# Patient Record
Sex: Male | Born: 1975
Health system: Southern US, Community
[De-identification: ages and names within clinical notes are randomized; demographics above are authoritative.]

## PROBLEM LIST (undated history)

## (undated) DIAGNOSIS — I214 Non-ST elevation (NSTEMI) myocardial infarction: Secondary | ICD-10-CM

## (undated) DIAGNOSIS — E785 Hyperlipidemia, unspecified: Secondary | ICD-10-CM

## (undated) HISTORY — DX: Hyperlipidemia, unspecified: E78.5

## (undated) NOTE — *Deleted (*Deleted)
Physician Discharge Summary  Patient ID: Aycen Porreca MRN: [id number] DOB/AGE: [birth year] 11 y.o.  Admit date: 06/15/2020 Discharge date: 06/16/2020  Admission Diagnoses:  Discharge Diagnoses:  Active Problems:   S/P CABG x 3  Patient Active Problem List   Diagnosis Date Noted  . NSTEMI (non-ST elevated myocardial infarction) (HCC) 06/15/2020  . S/P CABG x 3 06/15/2020  . Chest pain 06/14/2020  . Non-ST elevation (NSTEMI) myocardial infarction [facility])    History of Present Illness:  at time of consult    The patient is a 36 year old male with no previous documented history of cardiac disease.  He does have a known family history with father having CABG at age 75.  He was recently diagnosed approximately 6 months ago with hyperlipidemia.  He was prescribed atorvastatin but this was discontinued due to severe myalgia symptoms.  The patient is quite physically active and plays soccer frequently.  During a match on Thursday of last week he developed an episode of prolonged substernal chest tightness that lasted for approximately 1 hour and then subsided.  He continued to have some symptoms with exertion and attempting to play another game the pain worsened he was unable to continue.  On 06/13/2020 he had an episode at work while going up the stairs prompting further evaluation.  An EKG was done at rest and was unremarkable.  A repeat EKG with some activity showed a 2 mm ST depression in the inferior leads with a 1 to 2 mm ST elevation in lead aVR.  The patient was admitted for further evaluation and treatment to include cardiology consultation. Troponins (HS)peaked at 333 . LVEF was estimated at 50 to 55% and the full report is listed below.  Cardiac catheterization shows severe three-vessel coronary artery disease with occluded ostial LAD with some collaterals from the RCA, 95% stenosis in a large ostial ramus and severe right PDA disease.  EF was 50% with apical hypokinesis and mildly elevated  LVEDP at 24 mmHg.  Due to these findings an intra-aortic balloon pump has been placed this morning and he was transferred from [facility] for surgical coronary artery revascularization by Dr. Cliffton Asters   Discharged Condition: {condition:18240}  Hospital Course: ***  Consults: {consultation:18241}  Significant Diagnostic Studies: {diagnostics:18242}  Treatments: {Tx:18249}  Discharge Exam: Blood pressure 121/68, pulse 79, temperature 97.8 F (36.6 C), temperature source Oral, resp. rate (!) 23, height 5\' 9"  (1.753 m), weight 80.8 kg, SpO2 99 %. {physical YHCW:2376283}  Disposition:  There are no questions and answers to display.        Discharge Instructions    AMB Referral to Cardiac Rehabilitation - Phase II   Complete by: As directed    Diagnosis: CABG   CABG X ___: 3   After initial evaluation and assessments completed: Virtual Based Care may be provided alone or in conjunction with Phase 2 Cardiac Rehab based on patient barriers.: Yes     Allergies as of 06/16/2020   No Known Allergies   Med Rec must be completed prior to using this [facility]***       Follow-up Information    Iran Ouch, MD Follow up.   Specialty: Cardiology Why: Please see discharge paperwork for follow-up of her right with cardiology. Contact information: [address] [place] [address] [phone]        Corliss Skains, MD Follow up.   Specialty: Cardiothoracic Surgery Why: Please see discharge paperwork for follow-up appointment with surgeon.  Also obtain a chest  x-ray at [facility] 1/2-hour prior to this appointment.  It is located in the same office complex on the first floor. Contact information: [address] [place] [address] [phone]             The patient has been discharged on:   1.Beta Blocker:  Yes [   ]                              No   [   ]                              If No, reason:   2.Ace Inhibitor/ARB: Yes [   ]                                     No  [    ]                                     If No, reason:  3.Statin:   Yes [   ]                  No  [   ]                  If No, reason:  4.Ecasa:  Yes  [   ]                  No   [   ]                  If No, reason:   Signed: Glenice Laine Gold 06/16/2020, 3:10 PM

---

## 2018-10-09 DIAGNOSIS — F8189 Other developmental disorders of scholastic skills: Secondary | ICD-10-CM | POA: Diagnosis not present

## 2019-04-04 DIAGNOSIS — Z20828 Contact with and (suspected) exposure to other viral communicable diseases: Secondary | ICD-10-CM | POA: Diagnosis not present

## 2019-05-23 MED FILL — predniSONE 20 MG TABS: 20 | 10 days supply | Qty: 20 | Fill #0

## 2019-05-23 MED FILL — hydrOXYzine HCL 25 MG TABS: 25 | 7 days supply | Qty: 20 | Fill #0

## 2019-06-18 DIAGNOSIS — Z0189 Encounter for other specified special examinations: Secondary | ICD-10-CM | POA: Diagnosis not present

## 2019-06-18 DIAGNOSIS — R42 Dizziness and giddiness: Secondary | ICD-10-CM | POA: Diagnosis not present

## 2019-06-18 DIAGNOSIS — Z20828 Contact with and (suspected) exposure to other viral communicable diseases: Secondary | ICD-10-CM | POA: Diagnosis not present

## 2019-11-06 DIAGNOSIS — M25462 Effusion, left knee: Secondary | ICD-10-CM | POA: Diagnosis not present

## 2019-11-06 DIAGNOSIS — M25562 Pain in left knee: Secondary | ICD-10-CM | POA: Diagnosis not present

## 2019-11-11 DIAGNOSIS — L910 Hypertrophic scar: Secondary | ICD-10-CM | POA: Diagnosis not present

## 2019-11-18 DIAGNOSIS — M25462 Effusion, left knee: Secondary | ICD-10-CM | POA: Diagnosis not present

## 2019-12-03 DIAGNOSIS — R600 Localized edema: Secondary | ICD-10-CM | POA: Diagnosis not present

## 2019-12-03 DIAGNOSIS — M65862 Other synovitis and tenosynovitis, left lower leg: Secondary | ICD-10-CM | POA: Diagnosis not present

## 2019-12-03 DIAGNOSIS — M238X2 Other internal derangements of left knee: Secondary | ICD-10-CM | POA: Diagnosis not present

## 2019-12-03 DIAGNOSIS — M67962 Unspecified disorder of synovium and tendon, left lower leg: Secondary | ICD-10-CM | POA: Diagnosis not present

## 2019-12-03 DIAGNOSIS — M67814 Other specified disorders of tendon, left shoulder: Secondary | ICD-10-CM | POA: Diagnosis not present

## 2019-12-03 DIAGNOSIS — M25462 Effusion, left knee: Secondary | ICD-10-CM | POA: Diagnosis not present

## 2020-05-20 DIAGNOSIS — J00 Acute nasopharyngitis [common cold]: Secondary | ICD-10-CM | POA: Diagnosis not present

## 2020-05-20 DIAGNOSIS — Z03818 Encounter for observation for suspected exposure to other biological agents ruled out: Secondary | ICD-10-CM | POA: Diagnosis not present

## 2020-05-20 DIAGNOSIS — Z1152 Encounter for screening for COVID-19: Secondary | ICD-10-CM | POA: Diagnosis not present

## 2020-06-13 ENCOUNTER — Other Ambulatory Visit
Admission: RE | Admit: 2020-06-13 | Discharge: 2020-06-13 | Disposition: A | Discharge status: Home / Self Care | Payer: 59 | Source: Home / Self Care | Attending: Internal Medicine | Admitting: Internal Medicine

## 2020-06-13 DIAGNOSIS — Z01810 Encounter for preprocedural cardiovascular examination: Secondary | ICD-10-CM | POA: Diagnosis not present

## 2020-06-13 DIAGNOSIS — I952 Hypotension due to drugs: Secondary | ICD-10-CM | POA: Diagnosis not present

## 2020-06-13 DIAGNOSIS — Z83438 Family history of other disorder of lipoprotein metabolism and other lipidemia: Secondary | ICD-10-CM | POA: Diagnosis not present

## 2020-06-13 DIAGNOSIS — I200 Unstable angina: Secondary | ICD-10-CM | POA: Diagnosis not present

## 2020-06-13 DIAGNOSIS — Z825 Family history of asthma and other chronic lower respiratory diseases: Secondary | ICD-10-CM | POA: Diagnosis not present

## 2020-06-13 DIAGNOSIS — I25110 Atherosclerotic heart disease of native coronary artery with unstable angina pectoris: Secondary | ICD-10-CM | POA: Diagnosis not present

## 2020-06-13 DIAGNOSIS — R079 Chest pain, unspecified: Secondary | ICD-10-CM | POA: Insufficient documentation

## 2020-06-13 DIAGNOSIS — I739 Peripheral vascular disease, unspecified: Secondary | ICD-10-CM | POA: Diagnosis not present

## 2020-06-13 DIAGNOSIS — I214 Non-ST elevation (NSTEMI) myocardial infarction: Secondary | ICD-10-CM | POA: Diagnosis not present

## 2020-06-13 DIAGNOSIS — Z20822 Contact with and (suspected) exposure to covid-19: Secondary | ICD-10-CM | POA: Diagnosis not present

## 2020-06-13 DIAGNOSIS — E785 Hyperlipidemia, unspecified: Secondary | ICD-10-CM | POA: Insufficient documentation

## 2020-06-13 DIAGNOSIS — T463X5A Adverse effect of coronary vasodilators, initial encounter: Secondary | ICD-10-CM | POA: Diagnosis not present

## 2020-06-13 DIAGNOSIS — I340 Nonrheumatic mitral (valve) insufficiency: Secondary | ICD-10-CM | POA: Diagnosis not present

## 2020-06-13 DIAGNOSIS — R0789 Other chest pain: Secondary | ICD-10-CM | POA: Diagnosis not present

## 2020-06-13 DIAGNOSIS — R0602 Shortness of breath: Secondary | ICD-10-CM | POA: Diagnosis not present

## 2020-06-13 DIAGNOSIS — R030 Elevated blood-pressure reading, without diagnosis of hypertension: Secondary | ICD-10-CM | POA: Diagnosis not present

## 2020-06-13 DIAGNOSIS — Z8249 Family history of ischemic heart disease and other diseases of the circulatory system: Secondary | ICD-10-CM | POA: Diagnosis not present

## 2020-06-13 LAB — CBC WITH DIFFERENTIAL/PLATELET
Abs Immature Granulocytes: 0.01 10*3/uL (ref 0.00–0.07)
Basophils Absolute: 0.1 10*3/uL (ref 0.0–0.1)
Basophils Relative: 1 %
Eosinophils Absolute: 0.1 10*3/uL (ref 0.0–0.5)
Eosinophils Relative: 3 %
HCT: 43.2 % (ref 39.0–52.0)
Hemoglobin: 14.6 g/dL (ref 13.0–17.0)
Immature Granulocytes: 0 %
Lymphocytes Relative: 42 %
Lymphs Abs: 2.4 10*3/uL (ref 0.7–4.0)
MCH: 29.1 pg (ref 26.0–34.0)
MCHC: 33.8 g/dL (ref 30.0–36.0)
MCV: 86.2 fL (ref 80.0–100.0)
Monocytes Absolute: 0.4 10*3/uL (ref 0.1–1.0)
Monocytes Relative: 8 %
Neutro Abs: 2.7 10*3/uL (ref 1.7–7.7)
Neutrophils Relative %: 46 %
Platelets: 257 10*3/uL (ref 150–400)
RBC: 5.01 MIL/uL (ref 4.22–5.81)
RDW: 12.8 % (ref 11.5–15.5)
WBC: 5.7 10*3/uL (ref 4.0–10.5)
nRBC: 0 % (ref 0.0–0.2)

## 2020-06-13 LAB — COMPREHENSIVE METABOLIC PANEL
ALT: 20 U/L (ref 0–44)
AST: 22 U/L (ref 15–41)
Albumin: 4.6 g/dL (ref 3.5–5.0)
Alkaline Phosphatase: 63 U/L (ref 38–126)
Anion gap: 12 (ref 5–15)
BUN: 14 mg/dL (ref 6–20)
CO2: 26 mmol/L (ref 22–32)
Calcium: 9.3 mg/dL (ref 8.9–10.3)
Chloride: 101 mmol/L (ref 98–111)
Creatinine, Ser: 1.04 mg/dL (ref 0.61–1.24)
GFR, Estimated: >60 mL/min (ref >60)
Glucose, Bld: 110 mg/dL — ABNORMAL HIGH (ref 70–99)
Potassium: 3.9 mmol/L (ref 3.5–5.1)
Sodium: 139 mmol/L (ref 135–145)
Total Bilirubin: 0.9 mg/dL (ref 0.3–1.2)
Total Protein: 7.6 g/dL (ref 6.5–8.1)

## 2020-06-13 LAB — LIPID PANEL
Cholesterol: 225 mg/dL — ABNORMAL HIGH (ref 0–200)
HDL: 45 mg/dL (ref >40)
LDL Cholesterol: 161 mg/dL — ABNORMAL HIGH (ref 0–99)
Total CHOL/HDL Ratio: 5 RATIO
Triglycerides: 96 mg/dL (ref <150)
VLDL: 19 mg/dL (ref 0–40)

## 2020-06-13 LAB — TSH: TSH: 1.663 u[IU]/mL (ref 0.350–4.500)

## 2020-06-14 ENCOUNTER — Observation Stay (HOSPITAL_COMMUNITY)
Admission: AD | Admit: 2020-06-14 | Discharge: 2020-06-14 | Disposition: A | Discharge status: Home / Self Care | Payer: 59 | Source: Ambulatory Visit | Attending: Internal Medicine | Admitting: Internal Medicine

## 2020-06-14 ENCOUNTER — Encounter
Admission: AD | Disposition: A | Discharge status: Other Acute Inpatient Hospital | Payer: Self-pay | Source: Ambulatory Visit | Attending: Internal Medicine

## 2020-06-14 ENCOUNTER — Other Ambulatory Visit: Payer: Self-pay

## 2020-06-14 ENCOUNTER — Ambulatory Visit
Admission: RE | Admit: 2020-06-14 | Discharge: 2020-06-14 | Disposition: A | Discharge status: Home / Self Care | Payer: 59 | Source: Home / Self Care | Attending: Internal Medicine | Admitting: Internal Medicine

## 2020-06-14 ENCOUNTER — Inpatient Hospital Stay
Admission: AD | Admit: 2020-06-14 | Discharge: 2020-06-15 | DRG: 272 | Disposition: A | Discharge status: Other Acute Inpatient Hospital | Payer: 59 | Source: Ambulatory Visit | Attending: Internal Medicine | Admitting: Internal Medicine

## 2020-06-14 ENCOUNTER — Ambulatory Visit
Admission: RE | Admit: 2020-06-14 | Discharge: 2020-06-14 | Disposition: A | Discharge status: Home / Self Care | Payer: 59 | Source: Ambulatory Visit | Attending: Internal Medicine | Admitting: Internal Medicine

## 2020-06-14 ENCOUNTER — Emergency Department
Admission: EM | Admit: 2020-06-14 | Discharge: 2020-06-14 | Disposition: A | Discharge status: Still In Hospital | Payer: 59

## 2020-06-14 ENCOUNTER — Encounter: Payer: Self-pay | Admitting: Internal Medicine

## 2020-06-14 DIAGNOSIS — R030 Elevated blood-pressure reading, without diagnosis of hypertension: Secondary | ICD-10-CM | POA: Diagnosis not present

## 2020-06-14 DIAGNOSIS — R0602 Shortness of breath: Secondary | ICD-10-CM

## 2020-06-14 DIAGNOSIS — I25110 Atherosclerotic heart disease of native coronary artery with unstable angina pectoris: Secondary | ICD-10-CM | POA: Diagnosis present

## 2020-06-14 DIAGNOSIS — R079 Chest pain, unspecified: Secondary | ICD-10-CM | POA: Diagnosis not present

## 2020-06-14 DIAGNOSIS — I200 Unstable angina: Secondary | ICD-10-CM | POA: Diagnosis not present

## 2020-06-14 DIAGNOSIS — E785 Hyperlipidemia, unspecified: Secondary | ICD-10-CM | POA: Diagnosis present

## 2020-06-14 DIAGNOSIS — T463X5A Adverse effect of coronary vasodilators, initial encounter: Secondary | ICD-10-CM | POA: Diagnosis not present

## 2020-06-14 DIAGNOSIS — Z8249 Family history of ischemic heart disease and other diseases of the circulatory system: Secondary | ICD-10-CM

## 2020-06-14 DIAGNOSIS — R0789 Other chest pain: Secondary | ICD-10-CM | POA: Diagnosis not present

## 2020-06-14 DIAGNOSIS — Z20822 Contact with and (suspected) exposure to covid-19: Secondary | ICD-10-CM | POA: Diagnosis present

## 2020-06-14 DIAGNOSIS — Z83438 Family history of other disorder of lipoprotein metabolism and other lipidemia: Secondary | ICD-10-CM

## 2020-06-14 DIAGNOSIS — I340 Nonrheumatic mitral (valve) insufficiency: Secondary | ICD-10-CM | POA: Diagnosis not present

## 2020-06-14 DIAGNOSIS — I952 Hypotension due to drugs: Secondary | ICD-10-CM | POA: Diagnosis not present

## 2020-06-14 DIAGNOSIS — Z825 Family history of asthma and other chronic lower respiratory diseases: Secondary | ICD-10-CM

## 2020-06-14 DIAGNOSIS — I214 Non-ST elevation (NSTEMI) myocardial infarction: Principal | ICD-10-CM | POA: Diagnosis present

## 2020-06-14 HISTORY — PX: LEFT HEART CATH AND CORONARY ANGIOGRAPHY: CATH118249

## 2020-06-14 HISTORY — PX: CORONARY/GRAFT ACUTE MI REVASCULARIZATION: CATH118305

## 2020-06-14 LAB — ECHOCARDIOGRAM COMPLETE
AR max vel: 2.24 cm2
AV Peak grad: 8 mmHg
Ao pk vel: 1.41 m/s
Area-P 1/2: 4.41 cm2
S' Lateral: 4.1 cm

## 2020-06-14 LAB — HIV ANTIBODY (ROUTINE TESTING W REFLEX): HIV Screen 4th Generation wRfx: NONREACTIVE

## 2020-06-14 LAB — LIPID PANEL
Cholesterol: 193 mg/dL (ref 0–200)
HDL: 43 mg/dL (ref >40)
LDL Cholesterol: 137 mg/dL — ABNORMAL HIGH (ref 0–99)
Total CHOL/HDL Ratio: 4.5 RATIO
Triglycerides: 65 mg/dL (ref <150)
VLDL: 13 mg/dL (ref 0–40)

## 2020-06-14 LAB — TROPONIN I (HIGH SENSITIVITY)
Troponin I (High Sensitivity): 308 ng/L (ref <18)
Troponin I (High Sensitivity): 333 ng/L (ref <18)

## 2020-06-14 LAB — PROTIME-INR
INR: 1 (ref 0.8–1.2)
Prothrombin Time: 12.5 seconds (ref 11.4–15.2)

## 2020-06-14 LAB — RESP PANEL BY RT PCR (RSV, FLU A&B, COVID)
Influenza A by PCR: NEGATIVE
Influenza B by PCR: NEGATIVE
Respiratory Syncytial Virus by PCR: NEGATIVE
SARS Coronavirus 2 by RT PCR: NEGATIVE

## 2020-06-14 LAB — APTT: aPTT: 29 seconds (ref 24–36)

## 2020-06-14 SURGERY — LEFT HEART CATH AND CORONARY ANGIOGRAPHY
Anesthesia: Moderate Sedation

## 2020-06-14 SURGERY — CORONARY/GRAFT ACUTE MI REVASCULARIZATION
Anesthesia: Moderate Sedation

## 2020-06-14 MED ORDER — MIDAZOLAM HCL 2 MG/2ML IJ SOLN
INTRAMUSCULAR | Status: DC | PRN
  Administered 2020-06-14: 1 mg via INTRAVENOUS

## 2020-06-14 MED ORDER — METOPROLOL TARTRATE 25 MG PO TABS: 25.0000 mg | ORAL_TABLET | Freq: Two times a day (BID) | ORAL | Status: DC

## 2020-06-14 MED ORDER — ATORVASTATIN CALCIUM 20 MG PO TABS: 40.0000 mg | ORAL_TABLET | Freq: Every day | ORAL | Status: DC

## 2020-06-14 MED ORDER — HEPARIN SODIUM (PORCINE) 1000 UNIT/ML IJ SOLN
INTRAMUSCULAR | Status: AC
  Filled 2020-06-14: qty 1

## 2020-06-14 MED ORDER — FENTANYL CITRATE (PF) 100 MCG/2ML IJ SOLN
INTRAMUSCULAR | Status: DC | PRN
Start: 2020-06-14 — End: 2020-06-14
  Administered 2020-06-14: 25 ug via INTRAVENOUS

## 2020-06-14 MED ORDER — TRAZODONE HCL 50 MG PO TABS
50.0000 mg | ORAL_TABLET | Freq: Two times a day (BID) | ORAL | Status: DC | PRN
  Filled 2020-06-14: qty 1

## 2020-06-14 MED ORDER — HEPARIN BOLUS VIA INFUSION
4000.0000 [IU] | Freq: Once | INTRAVENOUS | Status: DC
  Filled 2020-06-14: qty 4000

## 2020-06-14 MED ORDER — INFLUENZA VAC SPLIT QUAD 0.5 ML IM SUSY
0.5000 mL | PREFILLED_SYRINGE | INTRAMUSCULAR | Status: DC
  Filled 2020-06-14: qty 0.5

## 2020-06-14 MED ORDER — HEPARIN (PORCINE) 25000 UT/250ML-% IV SOLN: 900.0000 [IU]/h | INTRAVENOUS | Status: DC

## 2020-06-14 MED ORDER — VERAPAMIL HCL 2.5 MG/ML IV SOLN
INTRAVENOUS | Status: AC
  Filled 2020-06-14: qty 2

## 2020-06-14 MED ORDER — ACETAMINOPHEN 325 MG PO TABS
650.0000 mg | ORAL_TABLET | ORAL | Status: DC | PRN
  Administered 2020-06-15 (×2): 650 mg via ORAL
  Filled 2020-06-14 (×2): qty 2

## 2020-06-14 MED ORDER — SODIUM CHLORIDE 0.9 % IV SOLN: 250.0000 mL | INTRAVENOUS | Status: DC | PRN

## 2020-06-14 MED ORDER — ROSUVASTATIN CALCIUM 10 MG PO TABS
10.0000 mg | ORAL_TABLET | Freq: Every day | ORAL | Status: DC
  Administered 2020-06-14: 10 mg via ORAL
  Filled 2020-06-14: qty 1

## 2020-06-14 MED ORDER — SODIUM CHLORIDE 0.9% FLUSH: 3.0000 mL | Freq: Two times a day (BID) | INTRAVENOUS | Status: DC

## 2020-06-14 MED ORDER — METOPROLOL TARTRATE 25 MG PO TABS: ORAL_TABLET | ORAL | 0 refills | Status: DC

## 2020-06-14 MED ORDER — LIDOCAINE HCL (PF) 1 % IJ SOLN
INTRAMUSCULAR | Status: AC
  Filled 2020-06-14: qty 30

## 2020-06-14 MED ORDER — SODIUM CHLORIDE 0.9 % IV SOLN: INTRAVENOUS | Status: DC

## 2020-06-14 MED ORDER — SODIUM CHLORIDE 0.9% FLUSH
3.0000 mL | Freq: Two times a day (BID) | INTRAVENOUS | Status: DC
  Administered 2020-06-15: 3 mL via INTRAVENOUS

## 2020-06-14 MED ORDER — HEPARIN (PORCINE) IN NACL 1000-0.9 UT/500ML-% IV SOLN
INTRAVENOUS | Status: DC | PRN
  Administered 2020-06-14: 1000 mL

## 2020-06-14 MED ORDER — HEPARIN SODIUM (PORCINE) 1000 UNIT/ML IJ SOLN
INTRAMUSCULAR | Status: DC | PRN
  Administered 2020-06-14: 4000 [IU] via INTRAVENOUS

## 2020-06-14 MED ORDER — HEPARIN (PORCINE) 25000 UT/250ML-% IV SOLN
900.0000 [IU]/h | INTRAVENOUS | Status: DC
  Administered 2020-06-14: 900 [IU]/h via INTRAVENOUS
  Filled 2020-06-14: qty 250

## 2020-06-14 MED ORDER — HEPARIN BOLUS VIA INFUSION
4000.0000 [IU] | Freq: Once | INTRAVENOUS | Status: AC
  Administered 2020-06-14: 4000 [IU] via INTRAVENOUS
  Filled 2020-06-14: qty 4000

## 2020-06-14 MED ORDER — FENTANYL CITRATE (PF) 100 MCG/2ML IJ SOLN
INTRAMUSCULAR | Status: AC
  Filled 2020-06-14: qty 2

## 2020-06-14 MED ORDER — ASPIRIN 81 MG PO CHEW: 81.0000 mg | CHEWABLE_TABLET | ORAL | Status: DC

## 2020-06-14 MED ORDER — ENOXAPARIN SODIUM 40 MG/0.4ML ~~LOC~~ SOLN: 40.0000 mg | SUBCUTANEOUS | Status: DC

## 2020-06-14 MED ORDER — ONDANSETRON HCL 4 MG/2ML IJ SOLN
4.0000 mg | Freq: Four times a day (QID) | INTRAMUSCULAR | Status: DC | PRN
  Administered 2020-06-15 (×2): 4 mg via INTRAVENOUS
  Filled 2020-06-14 (×2): qty 2

## 2020-06-14 MED ORDER — SODIUM CHLORIDE 0.9% FLUSH: 3.0000 mL | INTRAVENOUS | Status: DC | PRN

## 2020-06-14 MED ORDER — SODIUM CHLORIDE 0.9% FLUSH
3.0000 mL | INTRAVENOUS | Status: DC | PRN
  Administered 2020-06-15: 3 mL via INTRAVENOUS

## 2020-06-14 MED ORDER — ROSUVASTATIN CALCIUM 10 MG PO TABS: 20.0000 mg | ORAL_TABLET | Freq: Every day | ORAL | Status: DC

## 2020-06-14 MED ORDER — HEPARIN (PORCINE) IN NACL 1000-0.9 UT/500ML-% IV SOLN
INTRAVENOUS | Status: AC
  Filled 2020-06-14: qty 1000

## 2020-06-14 MED ORDER — ASPIRIN EC 81 MG PO TBEC
81.0000 mg | DELAYED_RELEASE_TABLET | Freq: Every day | ORAL | Status: DC
  Administered 2020-06-14: 81 mg via ORAL
  Filled 2020-06-14: qty 1

## 2020-06-14 MED ORDER — MIDAZOLAM HCL 2 MG/2ML IJ SOLN
INTRAMUSCULAR | Status: AC
  Filled 2020-06-14: qty 2

## 2020-06-14 MED FILL — METOPROLOL TARTRATE 25 MG T: 25 | 1 days supply | Qty: 1 | Fill #0

## 2020-06-14 SURGICAL SUPPLY — 12 items
CATH INFINITI 5FR ANG PIGTAIL (CATHETERS) ×3 IMPLANT
CATH INFINITI 5FR JK (CATHETERS) ×3 IMPLANT
DEVICE RAD TR BAND REGULAR (VASCULAR PRODUCTS) ×3 IMPLANT
GLIDESHEATH SLEND SS 6F .021 (SHEATH) ×3 IMPLANT
GUIDEWIRE INQWIRE 1.5J.035X260 (WIRE) ×1 IMPLANT
INQWIRE 1.5J .035X260CM (WIRE) ×3
KIT ENCORE 26 ADVANTAGE (KITS) ×3 IMPLANT
KIT PV (KITS) ×3 IMPLANT
KIT RIGHT HEART (MISCELLANEOUS) ×3 IMPLANT
PACK CARDIAC CATH (CUSTOM PROCEDURE TRAY) ×3 IMPLANT
PAD ONESTEP ZOLL R SERIES ADT (MISCELLANEOUS) ×3 IMPLANT
SYR MEDRAD MARK 7 150ML (SYRINGE) ×3 IMPLANT

## 2020-06-14 NOTE — Progress Notes (Signed)
*  PRELIMINARY RESULTS* Echocardiogram 2D Echocardiogram has been performed.  Neita Garnet Mont Jagoda 06/14/2020, 8:07 PM

## 2020-06-14 NOTE — Progress Notes (Signed)
Critical troponin 308 called by lab. MD Agbata notified as requested. No new orders at this time.   Update: Patient reports 3/10 chest pain but does not want any medications at this time. VSS. Sinus bradycardia on telemetry. MD Agbata notified. No new orders.   Update: Notified by nursing staff that patient was to be transferred to cath lab at the request of MD Arida. New IV inserted. IV heparin and IVf discontinued via verbal order given by MD Kirke Corin. Patient transported to cath lab with this RN, Loraine Grip, and MD Kirke Corin. Report given to [facility], Charity fundraiser. Patient wife updated by this RN at request of patient.   Update: This RN notified that patient would be transferring to ICU 19. Belongings placed in room by this RN.

## 2020-06-14 NOTE — Progress Notes (Signed)
ANTICOAGULATION CONSULT NOTE  Pharmacy Consult for Heparin infusion Indication: chest pain/ACS  No Known Allergies  Patient Measurements: Height: 5\' 9"  (175.3 cm) Weight: 77.7 kg (171 lb 4.8 oz) IBW/kg (Calculated) : 70.7  Vital Signs: Temp: 98.4 F (36.9 C) (10/18 1704) Temp Source: Oral (10/18 1704) BP: 148/94 (10/18 1704) Pulse Rate: 67 (10/18 1704)  Labs: Recent Labs    06/13/20 1420 06/13/20 1422  HGB  --  14.6  HCT  --  43.2  PLT  --  257  CREATININE 1.04  --     Estimated Creatinine Clearance: 88.8 mL/min (by C-G formula based on SCr of 1.04 mg/dL).   Medical History: No past medical history on file.  Medications:  Per chart review, patient not on anticoagulation prior to admission  Assessment: 44yo male with no significant medical history who presents with complaints of exertional chest pain which has increased in frequency and is associated with SOB. Patient had routine blood work done about 6 months ago and was found to have hyperlipidemia. He tried to take atorvastatin 20 mg daily but did not tolerate due to severe myalgia. EKG shows sinus rhythm with nonspecific T wave inversion. Cardiac enzymes pending. Pharmacy has been consulted for heparin dosing and monitoring for ACS.  Baseline H/H and platelets WNL aPTT and PT-INR pending  Goal of Therapy:  Heparin level 0.3-0.7 units/ml Monitor platelets by anticoagulation protocol: Yes   Plan:  Give 4000 units bolus x 1 Start heparin infusion at 900 units/hr Check anti-Xa level in 6 hours and daily while on heparin Continue to monitor H&H and platelets  Cardio recommending left heart catheterization possible PCI  44yo, PharmD Pharmacy Resident  06/14/2020 6:32 PM

## 2020-06-14 NOTE — Progress Notes (Signed)
Ordered Cardiac CT for patient to have done as soon as possible for chest pain.

## 2020-06-14 NOTE — Progress Notes (Signed)
The patient was started on a heparin drip. He continues to have chest pain rated as 3 out of 10 discomfort described as tightness in spite of aspirin and heparin drip. Not able to give a beta-blocker due to bradycardia. Troponin came back elevated in the 300 range. Given his continued chest pain, recommend proceeding with urgent cardiac catheterization and possible PCI. I discussed the procedure in details as well as risk and benefits.

## 2020-06-14 NOTE — Consult Note (Signed)
Cardiology Consultation:   Patient ID: Michael Franklin MRN: [id number]; DOB: Dec 17, 1975  Admit date: 06/14/2020 Date of Consult: 06/14/2020  Primary Care Provider: Wendall Stade, MD [facility] Cardiologist: [facility] Electrophysiologist:  None    Patient Profile:   Michael Franklin is a 44 y.o. male with a hx of hyperlipidemia and family history of coronary artery disease who is being seen today for the evaluation of unstable angina at the request of Dr. Joylene Igo.  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  His father had CABG at the age of 6.  The patient had routine blood work done about 6 months ago and he was found to have hyperlipidemia.  He tried to take atorvastatin 20 mg daily but did not tolerate the medication due to severe myalgia.  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  He played a soccer match on Thursday and after he finished, he had prolonged episode of substernal chest tightness that lasted for about an hour and subsided gradually.  After that, he started having exertional chest pain and tightness.  Yesterday, he tried to play a pickup game but was limited by exertional chest pain and thus he did not continue.  He came to work today and noted chest pain while he was going upstairs.  He had an EKG done which was normal at rest.  He got his heart rate up to around 100 and repeated the EKG which showed 2 mm of ST depression in the inferior leads with 1 to 2 mm of ST elevation in aVR.  Thus, it was arranged for the patient to get directly admitted to telemetry.  He had labs already.  When I interviewed him, he reports 3 out of 10 chest pain which was happening at rest.   No past medical history on file.     Home Medications:  Prior to Admission medications   Medication Sig Start Date End Date Taking? Authorizing Provider  metoprolol tartrate (LOPRESSOR) 25 MG tablet Please take one tablet by  mouth 2 hours prior to CT 06/14/20   Wendall Stade, MD    Inpatient Medications: Scheduled Meds: . aspirin EC  81 mg Oral Daily  . [START ON 06/15/2020] rosuvastatin  20 mg Oral Daily   Continuous Infusions: . sodium chloride 50 mL/hr at 06/14/20 1712   PRN Meds: acetaminophen, ondansetron (ZOFRAN) IV, traZODone  Allergies:   No Known Allergies  Social History:   Social History   Socioeconomic History  . Marital status: Married    Spouse name: Not on file  . Number of children: Not on file  . Years of education: Not on file  . Highest education level: Not on file  Occupational History  . Not on file  Tobacco Use  . Smoking status: Never Smoker  Substance and Sexual Activity  . Alcohol use: Never  . Drug use: Never  . Sexual activity: Not on file  Other Topics Concern  . Not on file  Social History Narrative  . Not on file   Social Determinants of Health   Financial Resource Strain:   . Difficulty of Paying Living Expenses: Not on file  Food Insecurity:   . Worried About Programme researcher, broadcasting/film/video in the Last Year: Not on file  . Ran Out of Food in the Last Year: Not on file  Transportation Needs:   . Lack of Transportation (Medical): Not on file  .  Lack of Transportation (Non-Medical): Not on file  Physical Activity:   . Days of Exercise per Week: Not on file  . Minutes of Exercise per Session: Not on file  Stress:   . Feeling of Stress : Not on file  Social Connections:   . Frequency of Communication with Friends and Family: Not on file  . Frequency of Social Gatherings with Friends and Family: Not on file  . Attends Religious Services: Not on file  . Active Member of Clubs or Organizations: Not on file  . Attends Banker Meetings: Not on file  . Marital Status: Not on file  Intimate Partner Violence:   . Fear of Current or Ex-Partner: Not on file  . Emotionally Abused: Not on file  . Physically Abused: Not on file  . Sexually Abused: Not on  file    Family History:    Family History  Problem Relation Age of Onset  . Asthma Mother   . Hypertension Father   . CAD Father   . Hyperlipidemia Father      ROS:  Please see the history of present illness.   All other ROS reviewed and negative.     Physical Exam/Data:   Vitals:   06/14/20 1704  BP: (!) 148/94  Pulse: 67  Resp: 18  Temp: 98.4 F (36.9 C)  TempSrc: Oral  SpO2: 99%  Weight: 77.7 kg  Height: 5\' 9"  (1.753 m)   No intake or output data in the 24 hours ending 06/14/20 1823 Last 3 Weights 06/14/2020  Weight (lbs) 171 lb 4.8 oz  Weight (kg) 77.701 kg     Body mass index is 25.3 kg/m.  General:  Well nourished, well developed, in no acute distress HEENT: normal Lymph: no adenopathy Neck: no JVD Endocrine:  No thryomegaly Vascular: No carotid bruits; FA pulses 2+ bilaterally without bruits  Cardiac:  normal S1, S2; RRR; no murmur  Lungs:  clear to auscultation bilaterally, no wheezing, rhonchi or rales  Abd: soft, nontender, no hepatomegaly  Ext: no edema Musculoskeletal:  No deformities, BUE and BLE strength normal and equal Skin: warm and dry  Neuro:  CNs 2-12 intact, no focal abnormalities noted Psych:  Normal affect  Right radial pulses normal.  EKG:  The EKG was personally reviewed and demonstrates: Sinus bradycardia with no significant ST or T wave changes.   Relevant CV Studies:   Laboratory Data:  High Sensitivity Troponin:  No results for input(s): TROPONINIHS in the last 720 hours.   Chemistry Recent Labs  Lab 06/13/20 1420  NA 139  K 3.9  CL 101  CO2 26  GLUCOSE 110*  BUN 14  CREATININE 1.04  CALCIUM 9.3  GFRNONAA >60  ANIONGAP 12    Recent Labs  Lab 06/13/20 1420  PROT 7.6  ALBUMIN 4.6  AST 22  ALT 20  ALKPHOS 63  BILITOT 0.9   Hematology Recent Labs  Lab 06/13/20 1422  WBC 5.7  RBC 5.01  HGB 14.6  HCT 43.2  MCV 86.2  MCH 29.1  MCHC 33.8  RDW 12.8  PLT 257   BNPNo results for input(s): BNP,  PROBNP in the last 168 hours.  DDimer No results for input(s): DDIMER in the last 168 hours.   Radiology/Studies:  No results found.   Assessment and Plan:   1. Unstable angina: The patient presents with chest pain that was initially exertional but that has progressed gradually to rest pain since Thursday.  He had an EKG done  with low level of exercise that showed significant ST depression in the inferior leads with minor ST elevation in aVR.  His symptoms at rest and EKG changes are very concerning.  I agree with hospital admission, aspirin and starting unfractionated heparin.  Check serial troponin.  I recommend proceeding with left heart catheterization possible PCI.  I discussed the procedure in details as well as risk and benefits.  We will proceed tomorrow. 2. Hyperlipidemia: Intolerance to atorvastatin 20 mg daily due to severe myalgia.  He felt that he had rhabdomyolysis given severity of his symptoms.  We will try rosuvastatin 20 mg daily instead.  If he is not able to tolerate potent statins, we might need to consider treatment with a PCSK9 inhibitor depending on his coronary angiogram findings. 3. Elevated blood pressure: He reports being intermittently on amlodipine in the past.  Monitor for now and we can start treatment with an ARB or amlodipine.  No beta-blockers given baseline bradycardia.  For questions or updates, please contact [facility] Please consult [email or web address] for contact info under    Signed, Lorine Bears, MD  06/14/2020 6:23 PM

## 2020-06-14 NOTE — H&P (Signed)
History and Physical    Michael Franklin TKW:[id number] DOB: October 01, 1975 DOA: 06/14/2020  PCP: Wendall Stade, MD   Patient coming from: Home  I have personally briefly reviewed patient's old medical records in [facility]  Chief Complaint: Chest pain  HPI: Michael Franklin is a 44 y.o. male with no significant medical history who presents with complaints of exertional chest pain which has increased in frequency and is associated with shortness of breath.  He denies having any nausea, no vomiting, no diaphoresis or palpitations. He has a family history of coronary artery disease and father status post CABG at age 66. He denies having any fever, no cough, no abdominal pain, no urinary symptoms any changes in his bowel habits. He is vaccinated against the COVID-19 virus Labs show sodium 139, potassium 3.9, chloride 101, bicarb 26, BUN 14, creatinine 1.04, calcium 9.3, alkaline phosphatase 63, albumin 4.6, AST 22, ALT 20, total protein 7.6, total cholesterol 225, HDL 45, LDL 161, triglycerides Hello 96, white count 5.7, hemoglobin 14.6, hematocrit 43.2, MCV 86.2, RDW 12.8, platelet count, TSH 1.6 Twelve-lead EKG reviewed by me shows sinus rhythm with nonspecific T wave inversion in the lateral leads.   ED Course: N/A  Review of Systems: As per HPI otherwise 10 point review of systems negative.    No past medical history on file.    reports that he has never smoked. He does not have any smokeless tobacco history on file. He reports that he does not drink alcohol and does not use drugs.  No Known Allergies  Family History  Problem Relation Age of Onset  . Asthma Mother   . Hypertension Father   . CAD Father   . Hyperlipidemia Father      Prior to Admission medications   Medication Sig Start Date End Date Taking? Authorizing Provider  metoprolol tartrate (LOPRESSOR) 25 MG tablet Please take one tablet by mouth 2 hours prior to CT 06/14/20   Wendall Stade, MD    Physical  Exam: Vitals:   06/14/20 1704  BP: (!) 148/94  Pulse: 67  Resp: 18  Temp: 98.4 F (36.9 C)  TempSrc: Oral  SpO2: 99%     Vitals:   06/14/20 1704  BP: (!) 148/94  Pulse: 67  Resp: 18  Temp: 98.4 F (36.9 C)  TempSrc: Oral  SpO2: 99%    Constitutional: NAD, alert and oriented x 3 Eyes: PERRL, lids and conjunctivae normal ENMT: Mucous membranes are moist.  Neck: normal, supple, no masses, no thyromegaly Respiratory: clear to auscultation bilaterally, no wheezing, no crackles. Normal respiratory effort. No accessory muscle use.  Cardiovascular: Regular rate and rhythm, no murmurs / rubs / gallops. No extremity edema. 2+ pedal pulses. No carotid bruits.  Abdomen: no tenderness, no masses palpated. No hepatosplenomegaly. Bowel sounds positive.  Musculoskeletal: no clubbing / cyanosis. No joint deformity upper and lower extremities.  Skin: no rashes, lesions, ulcers.  Neurologic: No gross focal neurologic deficit. Psychiatric: Normal mood and affect.   Labs on Admission: I have personally reviewed following labs and imaging studies  CBC: Recent Labs  Lab 06/13/20 1422  WBC 5.7  NEUTROABS 2.7  HGB 14.6  HCT 43.2  MCV 86.2  PLT 257   Basic Metabolic Panel: Recent Labs  Lab 06/13/20 1420  NA 139  K 3.9  CL 101  CO2 26  GLUCOSE 110*  BUN 14  CREATININE 1.04  CALCIUM 9.3   GFR: CrCl cannot be calculated (Unknown ideal weight.). Liver Function  Tests: Recent Labs  Lab 06/13/20 1420  AST 22  ALT 20  ALKPHOS 63  BILITOT 0.9  PROT 7.6  ALBUMIN 4.6   No results for input(s): LIPASE, AMYLASE in the last 168 hours. No results for input(s): AMMONIA in the last 168 hours. Coagulation Profile: No results for input(s): INR, PROTIME in the last 168 hours. Cardiac Enzymes: No results for input(s): CKTOTAL, CKMB, CKMBINDEX, TROPONINI in the last 168 hours. BNP (last 3 results) No results for input(s): PROBNP in the last 8760 hours. HbA1C: No results for  input(s): HGBA1C in the last 72 hours. CBG: No results for input(s): GLUCAP in the last 168 hours. Lipid Profile: Recent Labs    06/13/20 1420  CHOL 225*  HDL 45  LDLCALC 161*  TRIG 96  CHOLHDL 5.0   Thyroid Function Tests: Recent Labs    06/13/20 1420  TSH 1.663   Anemia Panel: No results for input(s): VITAMINB12, FOLATE, FERRITIN, TIBC, IRON, RETICCTPCT in the last 72 hours. Urine analysis: No results found for: COLORURINE, APPEARANCEUR, LABSPEC, PHURINE, GLUCOSEU, HGBUR, BILIRUBINUR, KETONESUR, PROTEINUR, UROBILINOGEN, NITRITE, LEUKOCYTESUR  Radiological Exams on Admission: No results found.  EKG: Independently reviewed.  Sinus rhythm with nonspecific T wave inversions in the lateral leads  Assessment/Plan Active Problems:   Chest pain    Chest pain ??  Stable angina with increased frequency We will obtain serial cardiac enzymes to rule out an acute coronary syndrome Place patient on aspirin 81 mg daily, low-dose metoprolol and statins We will obtain 2D echocardiogram to assess LVEF and rule out regional wall motion abnormality We will consult cardiology  DVT prophylaxis: Lovenox Code Status: Full code Family Communication: Greater than 50% of time was spent discussing patient's condition and plan of care with him at the bedside.  All questions and concerns have been addressed.  He verbalizes understanding and agrees with the plan. Disposition Plan: Back to previous home environment Consults called: Cardiology    Kala Ambriz MD [facility]     06/14/2020, 5:26 PM

## 2020-06-14 NOTE — Progress Notes (Signed)
Patient admitted to unit at this time. NAD noted. No CP or SOB at this time. A/Ox4 upon initial assessment and ambulatory. Tele/PIV placed. VSS upon direct admitting. Oriented to room and bed features. No concerns noted. Will continue to monitor.

## 2020-06-14 NOTE — Addendum Note (Signed)
Addended by: Virl Axe, Teniyah Seivert L on: 06/14/2020 11:17 AM   Modules accepted: Orders

## 2020-06-15 ENCOUNTER — Inpatient Hospital Stay (HOSPITAL_COMMUNITY): Payer: 59

## 2020-06-15 ENCOUNTER — Encounter
Admission: AD | Disposition: A | Discharge status: Other Acute Inpatient Hospital | Payer: Self-pay | Source: Ambulatory Visit | Attending: Internal Medicine

## 2020-06-15 ENCOUNTER — Inpatient Hospital Stay (HOSPITAL_COMMUNITY): Payer: 59 | Admitting: Registered Nurse

## 2020-06-15 ENCOUNTER — Observation Stay: Payer: 59

## 2020-06-15 ENCOUNTER — Encounter (HOSPITAL_COMMUNITY)
Admission: EM | Disposition: A | Discharge status: Home / Self Care | Payer: Self-pay | Source: Other Acute Inpatient Hospital | Attending: Thoracic Surgery (Cardiothoracic Vascular Surgery)

## 2020-06-15 ENCOUNTER — Encounter: Payer: Self-pay | Admitting: Internal Medicine

## 2020-06-15 ENCOUNTER — Inpatient Hospital Stay (HOSPITAL_COMMUNITY)
Admission: EM | Admit: 2020-06-15 | Discharge: 2020-06-19 | DRG: 236 | Disposition: A | Discharge status: Home / Self Care | Payer: 59 | Source: Other Acute Inpatient Hospital | Attending: Thoracic Surgery (Cardiothoracic Vascular Surgery) | Admitting: Thoracic Surgery (Cardiothoracic Vascular Surgery)

## 2020-06-15 DIAGNOSIS — Z23 Encounter for immunization: Secondary | ICD-10-CM

## 2020-06-15 DIAGNOSIS — J90 Pleural effusion, not elsewhere classified: Secondary | ICD-10-CM | POA: Diagnosis not present

## 2020-06-15 DIAGNOSIS — R0602 Shortness of breath: Secondary | ICD-10-CM | POA: Diagnosis not present

## 2020-06-15 DIAGNOSIS — J9811 Atelectasis: Secondary | ICD-10-CM | POA: Diagnosis not present

## 2020-06-15 DIAGNOSIS — E8770 Fluid overload, unspecified: Secondary | ICD-10-CM | POA: Diagnosis not present

## 2020-06-15 DIAGNOSIS — Z01810 Encounter for preprocedural cardiovascular examination: Secondary | ICD-10-CM

## 2020-06-15 DIAGNOSIS — D696 Thrombocytopenia, unspecified: Secondary | ICD-10-CM | POA: Diagnosis not present

## 2020-06-15 DIAGNOSIS — Z951 Presence of aortocoronary bypass graft: Secondary | ICD-10-CM

## 2020-06-15 DIAGNOSIS — Z20822 Contact with and (suspected) exposure to covid-19: Secondary | ICD-10-CM | POA: Diagnosis present

## 2020-06-15 DIAGNOSIS — Z8249 Family history of ischemic heart disease and other diseases of the circulatory system: Secondary | ICD-10-CM | POA: Diagnosis not present

## 2020-06-15 DIAGNOSIS — I25119 Atherosclerotic heart disease of native coronary artery with unspecified angina pectoris: Secondary | ICD-10-CM | POA: Diagnosis not present

## 2020-06-15 DIAGNOSIS — Z83438 Family history of other disorder of lipoprotein metabolism and other lipidemia: Secondary | ICD-10-CM

## 2020-06-15 DIAGNOSIS — Z79891 Long term (current) use of opiate analgesic: Secondary | ICD-10-CM | POA: Diagnosis not present

## 2020-06-15 DIAGNOSIS — I25110 Atherosclerotic heart disease of native coronary artery with unstable angina pectoris: Secondary | ICD-10-CM | POA: Diagnosis not present

## 2020-06-15 DIAGNOSIS — T463X5A Adverse effect of coronary vasodilators, initial encounter: Secondary | ICD-10-CM | POA: Diagnosis not present

## 2020-06-15 DIAGNOSIS — I739 Peripheral vascular disease, unspecified: Secondary | ICD-10-CM | POA: Diagnosis not present

## 2020-06-15 DIAGNOSIS — Z79899 Other long term (current) drug therapy: Secondary | ICD-10-CM

## 2020-06-15 DIAGNOSIS — R0789 Other chest pain: Secondary | ICD-10-CM | POA: Diagnosis present

## 2020-06-15 DIAGNOSIS — D500 Iron deficiency anemia secondary to blood loss (chronic): Secondary | ICD-10-CM | POA: Diagnosis not present

## 2020-06-15 DIAGNOSIS — E785 Hyperlipidemia, unspecified: Secondary | ICD-10-CM | POA: Diagnosis present

## 2020-06-15 DIAGNOSIS — I214 Non-ST elevation (NSTEMI) myocardial infarction: Secondary | ICD-10-CM | POA: Diagnosis present

## 2020-06-15 DIAGNOSIS — I952 Hypotension due to drugs: Secondary | ICD-10-CM | POA: Diagnosis not present

## 2020-06-15 DIAGNOSIS — I200 Unstable angina: Secondary | ICD-10-CM | POA: Diagnosis not present

## 2020-06-15 DIAGNOSIS — R072 Precordial pain: Secondary | ICD-10-CM

## 2020-06-15 DIAGNOSIS — R21 Rash and other nonspecific skin eruption: Secondary | ICD-10-CM | POA: Diagnosis not present

## 2020-06-15 DIAGNOSIS — R079 Chest pain, unspecified: Secondary | ICD-10-CM | POA: Diagnosis not present

## 2020-06-15 DIAGNOSIS — Z09 Encounter for follow-up examination after completed treatment for conditions other than malignant neoplasm: Secondary | ICD-10-CM

## 2020-06-15 DIAGNOSIS — I517 Cardiomegaly: Secondary | ICD-10-CM | POA: Diagnosis not present

## 2020-06-15 DIAGNOSIS — I371 Nonrheumatic pulmonary valve insufficiency: Secondary | ICD-10-CM | POA: Diagnosis not present

## 2020-06-15 DIAGNOSIS — R918 Other nonspecific abnormal finding of lung field: Secondary | ICD-10-CM | POA: Diagnosis not present

## 2020-06-15 DIAGNOSIS — Z825 Family history of asthma and other chronic lower respiratory diseases: Secondary | ICD-10-CM | POA: Diagnosis not present

## 2020-06-15 DIAGNOSIS — R030 Elevated blood-pressure reading, without diagnosis of hypertension: Secondary | ICD-10-CM | POA: Diagnosis not present

## 2020-06-15 HISTORY — PX: CORONARY ARTERY BYPASS GRAFT: SHX141

## 2020-06-15 HISTORY — PX: TEE WITHOUT CARDIOVERSION: SHX5443

## 2020-06-15 HISTORY — PX: IABP INSERTION: CATH118242

## 2020-06-15 LAB — CBC
HCT: 40 % (ref 39.0–52.0)
HCT: 30.5 % — ABNORMAL LOW (ref 39.0–52.0)
Hemoglobin: 14 g/dL (ref 13.0–17.0)
Hemoglobin: 10.2 g/dL — ABNORMAL LOW (ref 13.0–17.0)
MCH: 29.6 pg (ref 26.0–34.0)
MCH: 29.2 pg (ref 26.0–34.0)
MCHC: 35 g/dL (ref 30.0–36.0)
MCHC: 33.4 g/dL (ref 30.0–36.0)
MCV: 84.6 fL (ref 80.0–100.0)
MCV: 87.4 fL (ref 80.0–100.0)
Platelets: 234 10*3/uL (ref 150–400)
Platelets: 172 10*3/uL (ref 150–400)
RBC: 4.73 MIL/uL (ref 4.22–5.81)
RBC: 3.49 MIL/uL — ABNORMAL LOW (ref 4.22–5.81)
RDW: 12.5 % (ref 11.5–15.5)
RDW: 12.4 % (ref 11.5–15.5)
WBC: 7.2 10*3/uL (ref 4.0–10.5)
WBC: 10.6 10*3/uL — ABNORMAL HIGH (ref 4.0–10.5)
nRBC: 0 % (ref 0.0–0.2)
nRBC: 0 % (ref 0.0–0.2)

## 2020-06-15 LAB — POCT I-STAT 7, (LYTES, BLD GAS, ICA,H+H)
Acid-Base Excess: 0 mmol/L (ref 0.0–2.0)
Acid-Base Excess: 2 mmol/L (ref 0.0–2.0)
Acid-base deficit: 2 mmol/L (ref 0.0–2.0)
Acid-base deficit: 2 mmol/L (ref 0.0–2.0)
Acid-base deficit: 4 mmol/L — ABNORMAL HIGH (ref 0.0–2.0)
Bicarbonate: 25.7 mmol/L (ref 20.0–28.0)
Bicarbonate: 26.1 mmol/L (ref 20.0–28.0)
Bicarbonate: 22.8 mmol/L (ref 20.0–28.0)
Bicarbonate: 23.8 mmol/L (ref 20.0–28.0)
Bicarbonate: 22.7 mmol/L (ref 20.0–28.0)
Calcium, Ion: 1.15 mmol/L (ref 1.15–1.40)
Calcium, Ion: 0.98 mmol/L — ABNORMAL LOW (ref 1.15–1.40)
Calcium, Ion: 1.29 mmol/L (ref 1.15–1.40)
Calcium, Ion: 1.25 mmol/L (ref 1.15–1.40)
Calcium, Ion: 1.22 mmol/L (ref 1.15–1.40)
HCT: 35 % — ABNORMAL LOW (ref 39.0–52.0)
HCT: 27 % — ABNORMAL LOW (ref 39.0–52.0)
HCT: 26 % — ABNORMAL LOW (ref 39.0–52.0)
HCT: 28 % — ABNORMAL LOW (ref 39.0–52.0)
HCT: 32 % — ABNORMAL LOW (ref 39.0–52.0)
Hemoglobin: 11.9 g/dL — ABNORMAL LOW (ref 13.0–17.0)
Hemoglobin: 9.2 g/dL — ABNORMAL LOW (ref 13.0–17.0)
Hemoglobin: 8.8 g/dL — ABNORMAL LOW (ref 13.0–17.0)
Hemoglobin: 9.5 g/dL — ABNORMAL LOW (ref 13.0–17.0)
Hemoglobin: 10.9 g/dL — ABNORMAL LOW (ref 13.0–17.0)
O2 Saturation: 100 %
O2 Saturation: 100 %
O2 Saturation: 98 %
O2 Saturation: 99 %
O2 Saturation: 91 %
Patient temperature: 36.3
Patient temperature: 37.1
Potassium: 4 mmol/L (ref 3.5–5.1)
Potassium: 4.1 mmol/L (ref 3.5–5.1)
Potassium: 4.1 mmol/L (ref 3.5–5.1)
Potassium: 4 mmol/L (ref 3.5–5.1)
Potassium: 4 mmol/L (ref 3.5–5.1)
Sodium: 139 mmol/L (ref 135–145)
Sodium: 140 mmol/L (ref 135–145)
Sodium: 139 mmol/L (ref 135–145)
Sodium: 140 mmol/L (ref 135–145)
Sodium: 141 mmol/L (ref 135–145)
TCO2: 27 mmol/L (ref 22–32)
TCO2: 27 mmol/L (ref 22–32)
TCO2: 24 mmol/L (ref 22–32)
TCO2: 25 mmol/L (ref 22–32)
TCO2: 24 mmol/L (ref 22–32)
pCO2 arterial: 45.4 mmHg (ref 32.0–48.0)
pCO2 arterial: 40 mmHg (ref 32.0–48.0)
pCO2 arterial: 39.2 mmHg (ref 32.0–48.0)
pCO2 arterial: 43.3 mmHg (ref 32.0–48.0)
pCO2 arterial: 50.1 mmHg — ABNORMAL HIGH (ref 32.0–48.0)
pH, Arterial: 7.36 (ref 7.350–7.450)
pH, Arterial: 7.423 (ref 7.350–7.450)
pH, Arterial: 7.372 (ref 7.350–7.450)
pH, Arterial: 7.345 — ABNORMAL LOW (ref 7.350–7.450)
pH, Arterial: 7.264 — ABNORMAL LOW (ref 7.350–7.450)
pO2, Arterial: 566 mmHg — ABNORMAL HIGH (ref 83.0–108.0)
pO2, Arterial: 405 mmHg — ABNORMAL HIGH (ref 83.0–108.0)
pO2, Arterial: 103 mmHg (ref 83.0–108.0)
pO2, Arterial: 161 mmHg — ABNORMAL HIGH (ref 83.0–108.0)
pO2, Arterial: 72 mmHg — ABNORMAL LOW (ref 83.0–108.0)

## 2020-06-15 LAB — POCT I-STAT, CHEM 8
BUN: 15 mg/dL (ref 6–20)
BUN: 13 mg/dL (ref 6–20)
BUN: 12 mg/dL (ref 6–20)
BUN: 13 mg/dL (ref 6–20)
BUN: 13 mg/dL (ref 6–20)
Calcium, Ion: 1.16 mmol/L (ref 1.15–1.40)
Calcium, Ion: 1.14 mmol/L — ABNORMAL LOW (ref 1.15–1.40)
Calcium, Ion: 1.01 mmol/L — ABNORMAL LOW (ref 1.15–1.40)
Calcium, Ion: 1.07 mmol/L — ABNORMAL LOW (ref 1.15–1.40)
Calcium, Ion: 1.33 mmol/L (ref 1.15–1.40)
Chloride: 103 mmol/L (ref 98–111)
Chloride: 104 mmol/L (ref 98–111)
Chloride: 101 mmol/L (ref 98–111)
Chloride: 102 mmol/L (ref 98–111)
Chloride: 104 mmol/L (ref 98–111)
Creatinine, Ser: 0.9 mg/dL (ref 0.61–1.24)
Creatinine, Ser: 0.9 mg/dL (ref 0.61–1.24)
Creatinine, Ser: 0.8 mg/dL (ref 0.61–1.24)
Creatinine, Ser: 0.8 mg/dL (ref 0.61–1.24)
Creatinine, Ser: 0.9 mg/dL (ref 0.61–1.24)
Glucose, Bld: 143 mg/dL — ABNORMAL HIGH (ref 70–99)
Glucose, Bld: 122 mg/dL — ABNORMAL HIGH (ref 70–99)
Glucose, Bld: 104 mg/dL — ABNORMAL HIGH (ref 70–99)
Glucose, Bld: 145 mg/dL — ABNORMAL HIGH (ref 70–99)
Glucose, Bld: 127 mg/dL — ABNORMAL HIGH (ref 70–99)
HCT: 36 % — ABNORMAL LOW (ref 39.0–52.0)
HCT: 31 % — ABNORMAL LOW (ref 39.0–52.0)
HCT: 28 % — ABNORMAL LOW (ref 39.0–52.0)
HCT: 26 % — ABNORMAL LOW (ref 39.0–52.0)
HCT: 26 % — ABNORMAL LOW (ref 39.0–52.0)
Hemoglobin: 12.2 g/dL — ABNORMAL LOW (ref 13.0–17.0)
Hemoglobin: 10.5 g/dL — ABNORMAL LOW (ref 13.0–17.0)
Hemoglobin: 9.5 g/dL — ABNORMAL LOW (ref 13.0–17.0)
Hemoglobin: 8.8 g/dL — ABNORMAL LOW (ref 13.0–17.0)
Hemoglobin: 8.8 g/dL — ABNORMAL LOW (ref 13.0–17.0)
Potassium: 4 mmol/L (ref 3.5–5.1)
Potassium: 4 mmol/L (ref 3.5–5.1)
Potassium: 4 mmol/L (ref 3.5–5.1)
Potassium: 4.5 mmol/L (ref 3.5–5.1)
Potassium: 4 mmol/L (ref 3.5–5.1)
Sodium: 139 mmol/L (ref 135–145)
Sodium: 138 mmol/L (ref 135–145)
Sodium: 139 mmol/L (ref 135–145)
Sodium: 138 mmol/L (ref 135–145)
Sodium: 139 mmol/L (ref 135–145)
TCO2: 26 mmol/L (ref 22–32)
TCO2: 24 mmol/L (ref 22–32)
TCO2: 25 mmol/L (ref 22–32)
TCO2: 25 mmol/L (ref 22–32)
TCO2: 21 mmol/L — ABNORMAL LOW (ref 22–32)

## 2020-06-15 LAB — ABO/RH: ABO/RH(D): AB POS

## 2020-06-15 LAB — GLUCOSE, CAPILLARY
Glucose-Capillary: 125 mg/dL — ABNORMAL HIGH (ref 70–99)
Glucose-Capillary: 133 mg/dL — ABNORMAL HIGH (ref 70–99)

## 2020-06-15 LAB — HEMOGLOBIN AND HEMATOCRIT, BLOOD
HCT: 27.1 % — ABNORMAL LOW (ref 39.0–52.0)
Hemoglobin: 9.4 g/dL — ABNORMAL LOW (ref 13.0–17.0)

## 2020-06-15 LAB — POCT I-STAT EG7
Acid-base deficit: 1 mmol/L (ref 0.0–2.0)
Bicarbonate: 24.2 mmol/L (ref 20.0–28.0)
Calcium, Ion: 1.06 mmol/L — ABNORMAL LOW (ref 1.15–1.40)
HCT: 26 % — ABNORMAL LOW (ref 39.0–52.0)
Hemoglobin: 8.8 g/dL — ABNORMAL LOW (ref 13.0–17.0)
O2 Saturation: 82 %
Potassium: 4.3 mmol/L (ref 3.5–5.1)
Sodium: 140 mmol/L (ref 135–145)
TCO2: 25 mmol/L (ref 22–32)
pCO2, Ven: 42.9 mmHg — ABNORMAL LOW (ref 44.0–60.0)
pH, Ven: 7.359 (ref 7.250–7.430)
pO2, Ven: 49 mmHg — ABNORMAL HIGH (ref 32.0–45.0)

## 2020-06-15 LAB — PROTIME-INR
INR: 1 (ref 0.8–1.2)
INR: 1.2 (ref 0.8–1.2)
Prothrombin Time: 12.6 seconds (ref 11.4–15.2)
Prothrombin Time: 15.1 seconds (ref 11.4–15.2)

## 2020-06-15 LAB — MRSA PCR SCREENING: MRSA by PCR: NEGATIVE

## 2020-06-15 LAB — ECHO INTRAOPERATIVE TEE
Height: 69 in
Weight: 2779.56 oz

## 2020-06-15 LAB — PLATELET COUNT: Platelets: 190 10*3/uL (ref 150–400)

## 2020-06-15 LAB — SURGICAL PCR SCREEN
MRSA, PCR: NEGATIVE
Staphylococcus aureus: POSITIVE — AB

## 2020-06-15 LAB — TYPE AND SCREEN
ABO/RH(D): AB POS
Antibody Screen: NEGATIVE

## 2020-06-15 LAB — TROPONIN I (HIGH SENSITIVITY): Troponin I (High Sensitivity): 286 ng/L (ref <18)

## 2020-06-15 LAB — APTT
aPTT: 33 seconds (ref 24–36)
aPTT: 29 seconds (ref 24–36)

## 2020-06-15 SURGERY — CORONARY ARTERY BYPASS GRAFTING (CABG)
Anesthesia: General | Site: Chest

## 2020-06-15 SURGERY — IABP INSERTION

## 2020-06-15 MED ORDER — FENTANYL CITRATE (PF) 100 MCG/2ML IJ SOLN
50.0000 ug | INTRAMUSCULAR | Status: DC | PRN
  Filled 2020-06-15: qty 2

## 2020-06-15 MED ORDER — MORPHINE SULFATE (PF) 2 MG/ML IV SOLN: 2.0000 mg | INTRAVENOUS | 0 refills | Status: DC | PRN

## 2020-06-15 MED ORDER — MAGNESIUM SULFATE 4 GM/100ML IV SOLN
4.0000 g | Freq: Once | INTRAVENOUS | Status: AC
  Administered 2020-06-15: 4 g via INTRAVENOUS

## 2020-06-15 MED ORDER — HEPARIN (PORCINE) 25000 UT/250ML-% IV SOLN: 900.0000 [IU]/h | INTRAVENOUS | Status: DC

## 2020-06-15 MED ORDER — IOHEXOL 300 MG/ML  SOLN
INTRAMUSCULAR | Status: DC | PRN
  Administered 2020-06-14: 105 mL via INTRA_ARTERIAL

## 2020-06-15 MED ORDER — LIDOCAINE 2% (20 MG/ML) 5 ML SYRINGE
INTRAMUSCULAR | Status: AC
  Filled 2020-06-15: qty 5

## 2020-06-15 MED ORDER — BISACODYL 10 MG RE SUPP: 10.0000 mg | Freq: Every day | RECTAL | Status: DC

## 2020-06-15 MED ORDER — PLASMA-LYTE 148 IV SOLN
INTRAVENOUS | Status: DC | PRN
  Administered 2020-06-15: 500 mL via INTRAVASCULAR

## 2020-06-15 MED ORDER — MIDAZOLAM HCL (PF) 10 MG/2ML IJ SOLN
INTRAMUSCULAR | Status: AC
  Filled 2020-06-15: qty 2

## 2020-06-15 MED ORDER — PROPOFOL 10 MG/ML IV BOLUS
INTRAVENOUS | Status: AC
  Filled 2020-06-15: qty 20

## 2020-06-15 MED ORDER — MANNITOL 20 % IV SOLN
Freq: Once | INTRAVENOUS | Status: DC
  Filled 2020-06-15: qty 13

## 2020-06-15 MED ORDER — NOREPINEPHRINE 4 MG/250ML-% IV SOLN
0.0000 ug/min | INTRAVENOUS | Status: DC
  Administered 2020-06-15: 2 ug/min via INTRAVENOUS

## 2020-06-15 MED ORDER — EPINEPHRINE HCL 5 MG/250ML IV SOLN IN NS
0.0000 ug/min | INTRAVENOUS | Status: DC
  Filled 2020-06-15: qty 250

## 2020-06-15 MED ORDER — ASPIRIN EC 325 MG PO TBEC
325.0000 mg | DELAYED_RELEASE_TABLET | Freq: Every day | ORAL | Status: DC
  Administered 2020-06-16 – 2020-06-18 (×3): 325 mg via ORAL
  Filled 2020-06-15 (×4): qty 1

## 2020-06-15 MED ORDER — ROSUVASTATIN CALCIUM 20 MG PO TABS
20.0000 mg | ORAL_TABLET | Freq: Every day | ORAL | Status: DC
  Administered 2020-06-16 – 2020-06-19 (×4): 20 mg via ORAL
  Filled 2020-06-15 (×4): qty 1

## 2020-06-15 MED ORDER — FENTANYL CITRATE (PF) 100 MCG/2ML IJ SOLN
INTRAMUSCULAR | Status: AC
  Filled 2020-06-15: qty 2

## 2020-06-15 MED ORDER — ROCURONIUM BROMIDE 10 MG/ML (PF) SYRINGE
PREFILLED_SYRINGE | INTRAVENOUS | Status: DC | PRN
  Administered 2020-06-15: 100 mg via INTRAVENOUS
  Administered 2020-06-15: 40 mg via INTRAVENOUS
  Administered 2020-06-15: 100 mg via INTRAVENOUS
  Administered 2020-06-15: 60 mg via INTRAVENOUS

## 2020-06-15 MED ORDER — ISOSORBIDE MONONITRATE ER 30 MG PO TB24
30.0000 mg | ORAL_TABLET | Freq: Every day | ORAL | Status: DC
  Filled 2020-06-15: qty 1

## 2020-06-15 MED ORDER — BISACODYL 5 MG PO TBEC
10.0000 mg | DELAYED_RELEASE_TABLET | Freq: Every day | ORAL | Status: DC
  Administered 2020-06-16: 10 mg via ORAL
  Administered 2020-06-17: 5 mg via ORAL
  Administered 2020-06-19: 10 mg via ORAL
  Filled 2020-06-15 (×3): qty 2

## 2020-06-15 MED ORDER — POTASSIUM CHLORIDE 10 MEQ/50ML IV SOLN: 10.0000 meq | INTRAVENOUS | Status: AC

## 2020-06-15 MED ORDER — ACETAMINOPHEN 500 MG PO TABS
1000.0000 mg | ORAL_TABLET | Freq: Four times a day (QID) | ORAL | Status: DC
  Administered 2020-06-16 – 2020-06-19 (×8): 1000 mg via ORAL
  Filled 2020-06-15 (×8): qty 2

## 2020-06-15 MED ORDER — DEXTROSE 50 % IV SOLN: 0.0000 mL | INTRAVENOUS | Status: DC | PRN

## 2020-06-15 MED ORDER — PLASMA-LYTE 148 IV SOLN
INTRAVENOUS | Status: DC
  Filled 2020-06-15: qty 2.5

## 2020-06-15 MED ORDER — ONDANSETRON HCL 4 MG/2ML IJ SOLN
INTRAMUSCULAR | Status: DC | PRN
  Administered 2020-06-15: 4 mg via INTRAVENOUS

## 2020-06-15 MED ORDER — INSULIN REGULAR(HUMAN) IN NACL 100-0.9 UT/100ML-% IV SOLN: INTRAVENOUS | Status: DC

## 2020-06-15 MED ORDER — CHLORHEXIDINE GLUCONATE 0.12 % MT SOLN
15.0000 mL | OROMUCOSAL | Status: AC
  Administered 2020-06-15: 15 mL via OROMUCOSAL

## 2020-06-15 MED ORDER — DOCUSATE SODIUM 100 MG PO CAPS
200.0000 mg | ORAL_CAPSULE | Freq: Every day | ORAL | Status: DC
  Administered 2020-06-16 – 2020-06-19 (×4): 200 mg via ORAL
  Filled 2020-06-15 (×4): qty 2

## 2020-06-15 MED ORDER — PHENYLEPHRINE HCL-NACL 20-0.9 MG/250ML-% IV SOLN
30.0000 ug/min | INTRAVENOUS | Status: DC
  Administered 2020-06-15: 20 ug/min via INTRAVENOUS
  Filled 2020-06-15: qty 250

## 2020-06-15 MED ORDER — METOPROLOL TARTRATE 5 MG/5ML IV SOLN: 2.5000 mg | INTRAVENOUS | Status: DC | PRN

## 2020-06-15 MED ORDER — SODIUM CHLORIDE 0.9 % IV SOLN
1.5000 g | INTRAVENOUS | Status: AC
  Administered 2020-06-15: 1.5 g via INTRAVENOUS
  Filled 2020-06-15: qty 1.5

## 2020-06-15 MED ORDER — EPINEPHRINE HCL 5 MG/250ML IV SOLN IN NS: 0.0000 ug/min | INTRAVENOUS | Status: DC

## 2020-06-15 MED ORDER — ONDANSETRON HCL 4 MG/2ML IJ SOLN
4.0000 mg | Freq: Four times a day (QID) | INTRAMUSCULAR | Status: DC | PRN
  Administered 2020-06-16 – 2020-06-19 (×8): 4 mg via INTRAVENOUS
  Filled 2020-06-15 (×8): qty 2

## 2020-06-15 MED ORDER — SODIUM CHLORIDE 0.9 % IV SOLN
750.0000 mg | INTRAVENOUS | Status: DC
  Administered 2020-06-15: 750 mg via INTRAVENOUS
  Filled 2020-06-15: qty 750

## 2020-06-15 MED ORDER — HEPARIN SODIUM (PORCINE) 1000 UNIT/ML IJ SOLN
INTRAMUSCULAR | Status: DC | PRN
  Administered 2020-06-15: 25000 [IU] via INTRAVENOUS
  Administered 2020-06-15: 3000 [IU] via INTRAVENOUS
  Administered 2020-06-15: 8000 [IU] via INTRAVENOUS

## 2020-06-15 MED ORDER — FENTANYL CITRATE (PF) 250 MCG/5ML IJ SOLN
INTRAMUSCULAR | Status: AC
  Filled 2020-06-15: qty 20

## 2020-06-15 MED ORDER — SODIUM CHLORIDE 0.9 % IV SOLN
INTRAVENOUS | Status: DC
  Filled 2020-06-15: qty 30

## 2020-06-15 MED ORDER — ALBUMIN HUMAN 5 % IV SOLN
250.0000 mL | INTRAVENOUS | Status: AC | PRN
  Administered 2020-06-15 – 2020-06-16 (×2): 12.5 g via INTRAVENOUS

## 2020-06-15 MED ORDER — SODIUM CHLORIDE 0.45 % IV SOLN: INTRAVENOUS | Status: DC | PRN

## 2020-06-15 MED ORDER — MORPHINE SULFATE (PF) 2 MG/ML IV SOLN
INTRAVENOUS | Status: AC
  Administered 2020-06-15: 1 mg via INTRAVENOUS
  Filled 2020-06-15: qty 1

## 2020-06-15 MED ORDER — LACTATED RINGERS IV SOLN: INTRAVENOUS | Status: DC

## 2020-06-15 MED ORDER — ACETAMINOPHEN 160 MG/5ML PO SOLN
1000.0000 mg | Freq: Four times a day (QID) | ORAL | Status: DC
  Administered 2020-06-16: 1000 mg
  Filled 2020-06-15: qty 40.6

## 2020-06-15 MED ORDER — CHLORHEXIDINE GLUCONATE 0.12 % MT SOLN
15.0000 mL | Freq: Once | OROMUCOSAL | Status: AC
  Administered 2020-06-15: 15 mL via OROMUCOSAL
  Filled 2020-06-15: qty 15

## 2020-06-15 MED ORDER — FENTANYL CITRATE (PF) 100 MCG/2ML IJ SOLN
INTRAMUSCULAR | Status: DC | PRN
  Administered 2020-06-15: 25 ug via INTRAVENOUS

## 2020-06-15 MED ORDER — MANNITOL 20 % IV SOLN
INTRAVENOUS | Status: DC
  Filled 2020-06-15: qty 13

## 2020-06-15 MED ORDER — TEMAZEPAM 15 MG PO CAPS: 15.0000 mg | ORAL_CAPSULE | Freq: Once | ORAL | Status: DC | PRN

## 2020-06-15 MED ORDER — METOPROLOL TARTRATE 12.5 MG HALF TABLET
12.5000 mg | ORAL_TABLET | Freq: Once | ORAL | Status: DC
  Filled 2020-06-15: qty 1

## 2020-06-15 MED ORDER — NITROGLYCERIN IN D5W 200-5 MCG/ML-% IV SOLN: 0.0000 ug/min | INTRAVENOUS | Status: DC

## 2020-06-15 MED ORDER — SODIUM CHLORIDE 0.9 % IV SOLN: 250.0000 mL | INTRAVENOUS | Status: DC

## 2020-06-15 MED ORDER — NOREPINEPHRINE 4 MG/250ML-% IV SOLN: 0.0000 ug/min | INTRAVENOUS | Status: DC

## 2020-06-15 MED ORDER — NICARDIPINE HCL IN NACL 20-0.86 MG/200ML-% IV SOLN
3.0000 mg/h | INTRAVENOUS | Status: DC
  Administered 2020-06-15 – 2020-06-16 (×3): 3 mg/h via INTRAVENOUS
  Filled 2020-06-15 (×6): qty 200

## 2020-06-15 MED ORDER — SODIUM CHLORIDE 0.9 % IV SOLN
750.0000 mg | INTRAVENOUS | Status: DC
  Filled 2020-06-15: qty 750

## 2020-06-15 MED ORDER — VANCOMYCIN HCL IN DEXTROSE 1-5 GM/200ML-% IV SOLN
1000.0000 mg | Freq: Once | INTRAVENOUS | Status: AC
  Administered 2020-06-16: 1000 mg via INTRAVENOUS
  Filled 2020-06-15: qty 200

## 2020-06-15 MED ORDER — POTASSIUM CHLORIDE 2 MEQ/ML IV SOLN
80.0000 meq | INTRAVENOUS | Status: DC
  Filled 2020-06-15: qty 40

## 2020-06-15 MED ORDER — SODIUM CHLORIDE 0.9 % IV SOLN: 1.5000 g | INTRAVENOUS | Status: DC

## 2020-06-15 MED ORDER — DOBUTAMINE IN D5W 4-5 MG/ML-% IV SOLN: 2.5000 ug/kg/min | INTRAVENOUS | Status: DC

## 2020-06-15 MED ORDER — SODIUM CHLORIDE (PF) 0.9 % IJ SOLN
OROMUCOSAL | Status: DC | PRN
  Administered 2020-06-15: 8 mL via TOPICAL

## 2020-06-15 MED ORDER — ACETAMINOPHEN 160 MG/5ML PO SOLN: 650.0000 mg | Freq: Once | ORAL | Status: AC

## 2020-06-15 MED ORDER — SODIUM CHLORIDE 0.9 % IV SOLN: INTRAVENOUS | Status: DC

## 2020-06-15 MED ORDER — CHLORHEXIDINE GLUCONATE CLOTH 2 % EX PADS: 6.0000 | MEDICATED_PAD | Freq: Once | CUTANEOUS | Status: DC

## 2020-06-15 MED ORDER — MORPHINE SULFATE (PF) 2 MG/ML IV SOLN
2.0000 mg | INTRAVENOUS | Status: DC | PRN
  Administered 2020-06-15: 2 mg via INTRAVENOUS
  Filled 2020-06-15: qty 1

## 2020-06-15 MED ORDER — NITROGLYCERIN 0.4 MG SL SUBL: 0.4000 mg | SUBLINGUAL_TABLET | SUBLINGUAL | 12 refills | Status: DC | PRN

## 2020-06-15 MED ORDER — AMLODIPINE BESYLATE 2.5 MG PO TABS
2.5000 mg | ORAL_TABLET | Freq: Every day | ORAL | Status: DC
  Administered 2020-06-16 – 2020-06-19 (×4): 2.5 mg via ORAL
  Filled 2020-06-15 (×4): qty 1

## 2020-06-15 MED ORDER — MIDAZOLAM HCL 2 MG/2ML IJ SOLN: 2.0000 mg | INTRAMUSCULAR | Status: DC | PRN

## 2020-06-15 MED ORDER — LACTATED RINGERS IV SOLN: 500.0000 mL | Freq: Once | INTRAVENOUS | Status: DC | PRN

## 2020-06-15 MED ORDER — SODIUM CHLORIDE 0.9 % IV BOLUS
1000.0000 mL | Freq: Once | INTRAVENOUS | Status: AC
  Administered 2020-06-15: 1000 mL via INTRAVENOUS

## 2020-06-15 MED ORDER — NOREPINEPHRINE 4 MG/250ML-% IV SOLN
2.0000 ug/min | INTRAVENOUS | Status: DC
  Administered 2020-06-15: 2 ug/min via INTRAVENOUS

## 2020-06-15 MED ORDER — PANTOPRAZOLE SODIUM 40 MG PO TBEC
40.0000 mg | DELAYED_RELEASE_TABLET | Freq: Every day | ORAL | Status: DC
  Administered 2020-06-17 – 2020-06-19 (×3): 40 mg via ORAL
  Filled 2020-06-15 (×3): qty 1

## 2020-06-15 MED ORDER — ROSUVASTATIN CALCIUM 20 MG PO TABS: 20.0000 mg | ORAL_TABLET | Freq: Every day | ORAL | Status: DC

## 2020-06-15 MED ORDER — TRANEXAMIC ACID (OHS) BOLUS VIA INFUSION
15.0000 mg/kg | INTRAVENOUS | Status: DC
  Filled 2020-06-15: qty 1182

## 2020-06-15 MED ORDER — NITROGLYCERIN IN D5W 200-5 MCG/ML-% IV SOLN
INTRAVENOUS | Status: AC
  Administered 2020-06-15: 10 ug/min via INTRAVENOUS
  Filled 2020-06-15: qty 250

## 2020-06-15 MED ORDER — TRANEXAMIC ACID (OHS) PUMP PRIME SOLUTION
2.0000 mg/kg | INTRAVENOUS | Status: DC
  Filled 2020-06-15: qty 1.58

## 2020-06-15 MED ORDER — METOPROLOL TARTRATE 12.5 MG HALF TABLET
12.5000 mg | ORAL_TABLET | Freq: Two times a day (BID) | ORAL | Status: DC
  Administered 2020-06-16 – 2020-06-19 (×7): 12.5 mg via ORAL
  Filled 2020-06-15 (×7): qty 1

## 2020-06-15 MED ORDER — MORPHINE SULFATE (PF) 2 MG/ML IV SOLN
1.0000 mg | INTRAVENOUS | Status: DC | PRN
  Administered 2020-06-15 – 2020-06-16 (×2): 2 mg via INTRAVENOUS
  Administered 2020-06-16: 4 mg via INTRAVENOUS
  Administered 2020-06-16: 2 mg via INTRAVENOUS
  Filled 2020-06-15 (×3): qty 1
  Filled 2020-06-15 (×2): qty 2

## 2020-06-15 MED ORDER — TRAMADOL HCL 50 MG PO TABS
50.0000 mg | ORAL_TABLET | ORAL | Status: DC | PRN
  Administered 2020-06-16 – 2020-06-17 (×5): 100 mg via ORAL
  Filled 2020-06-15 (×5): qty 2

## 2020-06-15 MED ORDER — INSULIN REGULAR(HUMAN) IN NACL 100-0.9 UT/100ML-% IV SOLN
INTRAVENOUS | Status: DC
  Administered 2020-06-15: 1.6 [IU]/h via INTRAVENOUS
  Filled 2020-06-15: qty 100

## 2020-06-15 MED ORDER — TRANEXAMIC ACID (OHS) BOLUS VIA INFUSION
15.0000 mg/kg | INTRAVENOUS | Status: DC
  Administered 2020-06-15: 1182 mg via INTRAVENOUS
  Filled 2020-06-15: qty 1182

## 2020-06-15 MED ORDER — ACETAMINOPHEN 325 MG PO TABS
650.0000 mg | ORAL_TABLET | Freq: Four times a day (QID) | ORAL | Status: DC | PRN
  Administered 2020-06-15: 650 mg via ORAL
  Filled 2020-06-15: qty 2

## 2020-06-15 MED ORDER — LACTATED RINGERS IV SOLN: INTRAVENOUS | Status: DC | PRN

## 2020-06-15 MED ORDER — NITROGLYCERIN IN D5W 200-5 MCG/ML-% IV SOLN
2.0000 ug/min | INTRAVENOUS | Status: AC
  Administered 2020-06-15: 20 ug/min via INTRAVENOUS
  Filled 2020-06-15: qty 250

## 2020-06-15 MED ORDER — IOHEXOL 300 MG/ML  SOLN
INTRAMUSCULAR | Status: DC | PRN
  Administered 2020-06-15: 10 mL

## 2020-06-15 MED ORDER — NOREPINEPHRINE 4 MG/250ML-% IV SOLN: 2.0000 ug/min | INTRAVENOUS | Status: DC

## 2020-06-15 MED ORDER — DEXMEDETOMIDINE HCL IN NACL 400 MCG/100ML IV SOLN: 0.1000 ug/kg/h | INTRAVENOUS | Status: DC

## 2020-06-15 MED ORDER — NOREPINEPHRINE 4 MG/250ML-% IV SOLN
INTRAVENOUS | Status: DC | PRN
  Administered 2020-06-15: 10 ug/min via INTRAVENOUS

## 2020-06-15 MED ORDER — TRANEXAMIC ACID 1000 MG/10ML IV SOLN
1.5000 mg/kg/h | INTRAVENOUS | Status: DC
  Filled 2020-06-15: qty 25

## 2020-06-15 MED ORDER — SODIUM CHLORIDE 0.9% FLUSH
3.0000 mL | Freq: Two times a day (BID) | INTRAVENOUS | Status: DC
  Administered 2020-06-16 – 2020-06-19 (×6): 3 mL via INTRAVENOUS

## 2020-06-15 MED ORDER — VANCOMYCIN HCL 1250 MG/250ML IV SOLN
1250.0000 mg | INTRAVENOUS | Status: DC
  Administered 2020-06-15: 1250 mg via INTRAVENOUS
  Filled 2020-06-15: qty 250

## 2020-06-15 MED ORDER — ONDANSETRON HCL 4 MG/2ML IJ SOLN
INTRAMUSCULAR | Status: AC
  Filled 2020-06-15: qty 2

## 2020-06-15 MED ORDER — SODIUM CHLORIDE 0.9% FLUSH: 3.0000 mL | INTRAVENOUS | Status: DC | PRN

## 2020-06-15 MED ORDER — BISACODYL 5 MG PO TBEC: 5.0000 mg | DELAYED_RELEASE_TABLET | Freq: Once | ORAL | Status: DC

## 2020-06-15 MED ORDER — SODIUM CHLORIDE 0.9 % IV SOLN
1.5000 g | Freq: Two times a day (BID) | INTRAVENOUS | Status: AC
  Administered 2020-06-15 – 2020-06-17 (×4): 1.5 g via INTRAVENOUS
  Filled 2020-06-15 (×5): qty 1.5

## 2020-06-15 MED ORDER — DEXMEDETOMIDINE HCL IN NACL 400 MCG/100ML IV SOLN
0.1000 ug/kg/h | INTRAVENOUS | Status: DC
  Administered 2020-06-15: .5 ug/kg/h via INTRAVENOUS
  Filled 2020-06-15: qty 100

## 2020-06-15 MED ORDER — DEXMEDETOMIDINE HCL IN NACL 400 MCG/100ML IV SOLN: 0.0000 ug/kg/h | INTRAVENOUS | Status: DC

## 2020-06-15 MED ORDER — MIDAZOLAM HCL 2 MG/2ML IJ SOLN
INTRAMUSCULAR | Status: DC | PRN
  Administered 2020-06-15: 1 mg via INTRAVENOUS

## 2020-06-15 MED ORDER — NITROGLYCERIN 0.4 MG SL SUBL
SUBLINGUAL_TABLET | SUBLINGUAL | Status: AC
  Filled 2020-06-15: qty 1

## 2020-06-15 MED ORDER — ONDANSETRON HCL 4 MG/2ML IJ SOLN
4.0000 mg | Freq: Four times a day (QID) | INTRAMUSCULAR | Status: DC | PRN
  Administered 2020-06-15: 4 mg via INTRAVENOUS
  Filled 2020-06-15: qty 2

## 2020-06-15 MED ORDER — METOPROLOL TARTRATE 25 MG/10 ML ORAL SUSPENSION: 12.5000 mg | Freq: Two times a day (BID) | ORAL | Status: DC

## 2020-06-15 MED ORDER — NITROGLYCERIN 0.4 MG SL SUBL
0.4000 mg | SUBLINGUAL_TABLET | SUBLINGUAL | Status: DC | PRN
  Administered 2020-06-15 (×3): 0.4 mg via SUBLINGUAL

## 2020-06-15 MED ORDER — HEPARIN (PORCINE) IN NACL 1000-0.9 UT/500ML-% IV SOLN
INTRAVENOUS | Status: AC
  Filled 2020-06-15: qty 1000

## 2020-06-15 MED ORDER — MIDAZOLAM HCL 2 MG/2ML IJ SOLN
INTRAMUSCULAR | Status: AC
  Filled 2020-06-15: qty 2

## 2020-06-15 MED ORDER — HEPARIN (PORCINE) 25000 UT/250ML-% IV SOLN
900.0000 [IU]/h | INTRAVENOUS | Status: DC
  Administered 2020-06-15: 900 [IU]/h via INTRAVENOUS
  Filled 2020-06-15: qty 250

## 2020-06-15 MED ORDER — ASPIRIN 81 MG PO CHEW: 81.0000 mg | CHEWABLE_TABLET | ORAL | Status: DC

## 2020-06-15 MED ORDER — MIDAZOLAM HCL 5 MG/5ML IJ SOLN
INTRAMUSCULAR | Status: DC | PRN
  Administered 2020-06-15 (×3): 2 mg via INTRAVENOUS
  Administered 2020-06-15: 1 mg via INTRAVENOUS
  Administered 2020-06-15: 3 mg via INTRAVENOUS

## 2020-06-15 MED ORDER — VANCOMYCIN HCL 1250 MG/250ML IV SOLN
1250.0000 mg | INTRAVENOUS | Status: DC
  Filled 2020-06-15: qty 250

## 2020-06-15 MED ORDER — NITROGLYCERIN IN D5W 200-5 MCG/ML-% IV SOLN: 7.0000 ug/min | INTRAVENOUS | Status: DC

## 2020-06-15 MED ORDER — MILRINONE LACTATE IN DEXTROSE 20-5 MG/100ML-% IV SOLN: 0.3000 ug/kg/min | INTRAVENOUS | Status: DC

## 2020-06-15 MED ORDER — NOREPINEPHRINE 4 MG/250ML-% IV SOLN
INTRAVENOUS | Status: AC
  Administered 2020-06-15: 4 mg
  Filled 2020-06-15: qty 250

## 2020-06-15 MED ORDER — FENTANYL CITRATE (PF) 250 MCG/5ML IJ SOLN
INTRAMUSCULAR | Status: DC | PRN
Start: 2020-06-15 — End: 2020-06-15
  Administered 2020-06-15 (×3): 150 ug via INTRAVENOUS
  Administered 2020-06-15 (×3): 100 ug via INTRAVENOUS

## 2020-06-15 MED ORDER — PROTAMINE SULFATE 10 MG/ML IV SOLN
INTRAVENOUS | Status: AC
  Filled 2020-06-15: qty 25

## 2020-06-15 MED ORDER — NITROGLYCERIN IN D5W 200-5 MCG/ML-% IV SOLN: 2.0000 ug/min | INTRAVENOUS | Status: DC

## 2020-06-15 MED ORDER — SODIUM CHLORIDE 0.9 % IR SOLN
Status: DC | PRN
  Administered 2020-06-15: 5000

## 2020-06-15 MED ORDER — ACETAMINOPHEN 650 MG RE SUPP
650.0000 mg | Freq: Once | RECTAL | Status: AC
  Administered 2020-06-15: 650 mg via RECTAL

## 2020-06-15 MED ORDER — MUPIROCIN 2 % EX OINT
1.0000 "application " | TOPICAL_OINTMENT | Freq: Two times a day (BID) | CUTANEOUS | Status: DC
  Administered 2020-06-15 – 2020-06-19 (×9): 1 via NASAL
  Filled 2020-06-15 (×4): qty 22

## 2020-06-15 MED ORDER — HEPARIN SODIUM (PORCINE) 1000 UNIT/ML IJ SOLN
INTRAMUSCULAR | Status: AC
  Filled 2020-06-15: qty 1

## 2020-06-15 MED ORDER — PROTAMINE SULFATE 10 MG/ML IV SOLN
INTRAVENOUS | Status: DC | PRN
  Administered 2020-06-15 (×3): 50 mg via INTRAVENOUS
  Administered 2020-06-15: 30 mg via INTRAVENOUS
  Administered 2020-06-15 (×2): 50 mg via INTRAVENOUS

## 2020-06-15 MED ORDER — TRANEXAMIC ACID 1000 MG/10ML IV SOLN
1.5000 mg/kg/h | INTRAVENOUS | Status: DC
  Administered 2020-06-15: 1.5 mg/kg/h via INTRAVENOUS
  Filled 2020-06-15: qty 25

## 2020-06-15 MED ORDER — NOREPINEPHRINE 4 MG/250ML-% IV SOLN
0.0000 ug/min | INTRAVENOUS | Status: AC
  Administered 2020-06-15: 10 ug/min via INTRAVENOUS
  Filled 2020-06-15 (×2): qty 250

## 2020-06-15 MED ORDER — OXYCODONE HCL 5 MG PO TABS
5.0000 mg | ORAL_TABLET | ORAL | Status: DC | PRN
  Administered 2020-06-16 – 2020-06-18 (×5): 10 mg via ORAL
  Administered 2020-06-19: 5 mg via ORAL
  Administered 2020-06-19 (×2): 10 mg via ORAL
  Filled 2020-06-15 (×2): qty 2
  Filled 2020-06-15: qty 1
  Filled 2020-06-15 (×5): qty 2

## 2020-06-15 MED ORDER — MORPHINE SULFATE (PF) 2 MG/ML IV SOLN
INTRAVENOUS | Status: AC
  Administered 2020-06-15: 2 mg via INTRAVENOUS
  Filled 2020-06-15: qty 1

## 2020-06-15 MED ORDER — FAMOTIDINE IN NACL 20-0.9 MG/50ML-% IV SOLN
20.0000 mg | Freq: Two times a day (BID) | INTRAVENOUS | Status: AC
  Administered 2020-06-15 – 2020-06-16 (×2): 20 mg via INTRAVENOUS
  Filled 2020-06-15: qty 50

## 2020-06-15 MED ORDER — PHENYLEPHRINE 40 MCG/ML (10ML) SYRINGE FOR IV PUSH (FOR BLOOD PRESSURE SUPPORT)
PREFILLED_SYRINGE | INTRAVENOUS | Status: DC | PRN
  Administered 2020-06-15: 80 ug via INTRAVENOUS

## 2020-06-15 MED ORDER — MORPHINE SULFATE (PF) 2 MG/ML IV SOLN: 1.0000 mg | INTRAVENOUS | Status: DC | PRN

## 2020-06-15 MED ORDER — PROPOFOL 10 MG/ML IV BOLUS
INTRAVENOUS | Status: DC | PRN
  Administered 2020-06-15: 100 mg via INTRAVENOUS

## 2020-06-15 MED ORDER — ASPIRIN 81 MG PO CHEW
324.0000 mg | CHEWABLE_TABLET | Freq: Every day | ORAL | Status: DC
  Administered 2020-06-19: 324 mg
  Filled 2020-06-15: qty 4

## 2020-06-15 MED ORDER — MILRINONE LACTATE IN DEXTROSE 20-5 MG/100ML-% IV SOLN
0.3000 ug/kg/min | INTRAVENOUS | Status: DC
  Filled 2020-06-15: qty 100

## 2020-06-15 MED ORDER — PHENYLEPHRINE HCL-NACL 20-0.9 MG/250ML-% IV SOLN: 30.0000 ug/min | INTRAVENOUS | Status: DC

## 2020-06-15 SURGICAL SUPPLY — 88 items
BAG DECANTER FOR FLEXI CONT (MISCELLANEOUS) ×4 IMPLANT
BLADE CLIPPER SURG (BLADE) ×4 IMPLANT
BLADE STERNUM SYSTEM 6 (BLADE) ×4 IMPLANT
BLADE SURG 11 STRL SS (BLADE) ×4 IMPLANT
BLADE SURG 15 STRL LF DISP TIS (BLADE) ×2 IMPLANT
BLADE SURG 15 STRL SS (BLADE) ×4
BNDG ELASTIC 4X5.8 VLCR STR LF (GAUZE/BANDAGES/DRESSINGS) ×8 IMPLANT
BNDG ELASTIC 6X5.8 VLCR STR LF (GAUZE/BANDAGES/DRESSINGS) ×4 IMPLANT
BNDG GAUZE ELAST 4 BULKY (GAUZE/BANDAGES/DRESSINGS) ×4 IMPLANT
CABLE SURGICAL S-101-97-12 (CABLE) ×4 IMPLANT
CANISTER SUCT 3000ML PPV (MISCELLANEOUS) ×4 IMPLANT
CANNULA MC2 2 STG 29/37 NON-V (CANNULA) ×4 IMPLANT
CANNULA MC2 TWO STAGE (CANNULA) ×4
CANNULA NON VENT 20FR 12 (CANNULA) ×8 IMPLANT
CATH ROBINSON RED A/P 18FR (CATHETERS) ×8 IMPLANT
CLIP RETRACTION 3.0MM CORONARY (MISCELLANEOUS) ×4 IMPLANT
CLIP VESOCCLUDE MED 24/CT (CLIP) ×4 IMPLANT
CLIP VESOCCLUDE SM WIDE 24/CT (CLIP) ×20 IMPLANT
CONN ST 1/2X1/2  BEN (MISCELLANEOUS) ×2
CONN ST 1/2X1/2 BEN (MISCELLANEOUS) ×2 IMPLANT
CONNECTOR BLAKE 2:1 CARIO BLK (MISCELLANEOUS) ×4 IMPLANT
DEFOGGER ANTIFOG KIT (MISCELLANEOUS) ×4 IMPLANT
DRAIN CHANNEL 19F RND (DRAIN) ×12 IMPLANT
DRAIN CONNECTOR BLAKE 1:1 (MISCELLANEOUS) ×4 IMPLANT
DRAPE CARDIOVASCULAR INCISE (DRAPES) ×4
DRAPE INCISE IOBAN 66X45 STRL (DRAPES) IMPLANT
DRAPE SLUSH/WARMER DISC (DRAPES) ×4 IMPLANT
DRAPE SRG 135X102X78XABS (DRAPES) ×2 IMPLANT
DRSG AQUACEL AG ADV 3.5X14 (GAUZE/BANDAGES/DRESSINGS) ×4 IMPLANT
DRSG COVADERM 4X14 (GAUZE/BANDAGES/DRESSINGS) ×4 IMPLANT
ELECT BLADE 4.0 EZ CLEAN MEGAD (MISCELLANEOUS) ×4
ELECT REM PT RETURN 9FT ADLT (ELECTROSURGICAL) ×8
ELECTRODE BLDE 4.0 EZ CLN MEGD (MISCELLANEOUS) ×2 IMPLANT
ELECTRODE REM PT RTRN 9FT ADLT (ELECTROSURGICAL) ×4 IMPLANT
EVACUATOR SILICONE 100CC (DRAIN) ×12 IMPLANT
FELT TEFLON 1X6 (MISCELLANEOUS) ×4 IMPLANT
GAUZE SPONGE 4X4 12PLY STRL (GAUZE/BANDAGES/DRESSINGS) ×4 IMPLANT
GAUZE SPONGE 4X4 12PLY STRL LF (GAUZE/BANDAGES/DRESSINGS) ×4 IMPLANT
GAUZE SPONGE 4X4 16PLY XRAY LF (GAUZE/BANDAGES/DRESSINGS) ×4 IMPLANT
GLOVE BIO SURGEON STRL SZ7 (GLOVE) ×8 IMPLANT
GLOVE BIOGEL PI IND STRL 6 (GLOVE) ×2 IMPLANT
GLOVE BIOGEL PI INDICATOR 6 (GLOVE) ×2
GOWN STRL REUS W/ TWL LRG LVL3 (GOWN DISPOSABLE) ×8 IMPLANT
GOWN STRL REUS W/ TWL XL LVL3 (GOWN DISPOSABLE) ×4 IMPLANT
GOWN STRL REUS W/TWL LRG LVL3 (GOWN DISPOSABLE) ×16
GOWN STRL REUS W/TWL XL LVL3 (GOWN DISPOSABLE) ×8
HEMOSTAT POWDER SURGIFOAM 1G (HEMOSTASIS) ×12 IMPLANT
INSERT SUTURE HOLDER (MISCELLANEOUS) ×4 IMPLANT
KIT BASIN OR (CUSTOM PROCEDURE TRAY) ×4 IMPLANT
KIT SUCTION CATH 14FR (SUCTIONS) ×4 IMPLANT
KIT TURNOVER KIT B (KITS) ×4 IMPLANT
KIT VASOVIEW HEMOPRO 2 VH 4000 (KITS) ×4 IMPLANT
LEAD PACING MYOCARDI (MISCELLANEOUS) ×4 IMPLANT
MARKER GRAFT CORONARY BYPASS (MISCELLANEOUS) ×8 IMPLANT
NS IRRIG 1000ML POUR BTL (IV SOLUTION) ×20 IMPLANT
PACK ACCESSORY CANNULA KIT (KITS) ×4 IMPLANT
PACK E OPEN HEART (SUTURE) ×4 IMPLANT
PACK OPEN HEART (CUSTOM PROCEDURE TRAY) ×4 IMPLANT
PAD ARMBOARD 7.5X6 YLW CONV (MISCELLANEOUS) ×8 IMPLANT
PAD ELECT DEFIB RADIOL ZOLL (MISCELLANEOUS) ×4 IMPLANT
PENCIL BUTTON HOLSTER BLD 10FT (ELECTRODE) ×4 IMPLANT
POSITIONER HEAD DONUT 9IN (MISCELLANEOUS) ×4 IMPLANT
PUNCH AORTIC ROTATE 4.0MM (MISCELLANEOUS) ×4 IMPLANT
SET CARDIOPLEGIA MPS 5001102 (MISCELLANEOUS) ×4 IMPLANT
SHEARS HARMONIC 9CM CVD (BLADE) ×4 IMPLANT
SPONGE LAP 18X18 RF (DISPOSABLE) ×4 IMPLANT
SUPPORT HEART JANKE-BARRON (MISCELLANEOUS) ×4 IMPLANT
SUT BONE WAX W31G (SUTURE) ×4 IMPLANT
SUT ETHIBOND X763 2 0 SH 1 (SUTURE) ×8 IMPLANT
SUT MNCRL AB 3-0 PS2 18 (SUTURE) ×8 IMPLANT
SUT MNCRL AB 4-0 PS2 18 (SUTURE) ×8 IMPLANT
SUT PDS AB 1 CTX 36 (SUTURE) ×8 IMPLANT
SUT PROLENE 4 0 RB 1 (SUTURE) ×4
SUT PROLENE 4 0 SH DA (SUTURE) ×4 IMPLANT
SUT PROLENE 4-0 RB1 .5 CRCL 36 (SUTURE) ×2 IMPLANT
SUT PROLENE 5 0 C 1 36 (SUTURE) ×12 IMPLANT
SUT PROLENE 7 0 BV1 MDA (SUTURE) ×8 IMPLANT
SUT STEEL 6MS V (SUTURE) ×8 IMPLANT
SUT VIC AB 2-0 CT1 27 (SUTURE) ×8
SUT VIC AB 2-0 CT1 TAPERPNT 27 (SUTURE) ×4 IMPLANT
SYR 50ML LL SCALE MARK (SYRINGE) ×4 IMPLANT
SYSTEM SAHARA CHEST DRAIN ATS (WOUND CARE) ×4 IMPLANT
TAPE CLOTH SURG 4X10 WHT LF (GAUZE/BANDAGES/DRESSINGS) ×4 IMPLANT
TOWEL GREEN STERILE FF (TOWEL DISPOSABLE) ×4 IMPLANT
TRAY FOLEY SLVR 16FR TEMP STAT (SET/KITS/TRAYS/PACK) ×4 IMPLANT
TUBING LAP HI FLOW INSUFFLATIO (TUBING) ×4 IMPLANT
UNDERPAD 30X36 HEAVY ABSORB (UNDERPADS AND DIAPERS) ×4 IMPLANT
WATER STERILE IRR 1000ML POUR (IV SOLUTION) ×8 IMPLANT

## 2020-06-15 SURGICAL SUPPLY — 7 items
BALLN IABP SENSA PLUS 8F 50CC (BALLOONS) ×3
BALLOON IABP SENS PLUS 8F 50CC (BALLOONS) ×1 IMPLANT
CANNULA 5F STIFF (CANNULA) ×3 IMPLANT
KIT TRANSPAC II SGL 4260605 (MISCELLANEOUS) ×3 IMPLANT
NEEDLE PERC 18GX7CM (NEEDLE) ×3 IMPLANT
PACK CARDIAC CATH (CUSTOM PROCEDURE TRAY) ×3 IMPLANT
WIRE GUIDERIGHT .035X150 (WIRE) ×3 IMPLANT

## 2020-06-15 NOTE — Progress Notes (Signed)
Pre-CABG Dopplers completed. Refer to "CV Proc" under chart review to view preliminary results.  06/15/2020 11:40 AM Eula Fried., MHA, RVT, RDCS, RDMS

## 2020-06-15 NOTE — Anesthesia Postprocedure Evaluation (Signed)
Anesthesia Post Note  Patient: Ichiro Fullen  Procedure(s) Performed: CORONARY ARTERY BYPASS GRAFTING (CABG) TIMES THREE USING HARVESTED LEFT RADIAL ARTERY, LEFT INTERNAL MAMMARY ARTERY,RIGHT GREATER SAPHENOUS VEIN,HARVESTED ENDOSCOPICALLY. Removal of balloon pump. (N/A Chest) TRANSESOPHAGEAL ECHOCARDIOGRAM (TEE) (N/A )     Patient location during evaluation: SICU Anesthesia Type: General Level of consciousness: sedated, responds to stimulation and patient remains intubated per anesthesia plan Pain management: pain level controlled Vital Signs Assessment: post-procedure vital signs reviewed and stable Respiratory status: patient remains intubated per anesthesia plan and patient on ventilator - see flowsheet for VS (RT beginning to wean vent) Cardiovascular status: stable Postop Assessment: no apparent nausea or vomiting Anesthetic complications: no   No complications documented.  Last Vitals:  Vitals:   06/15/20 2015 06/15/20 2030  BP: (!) 87/59 93/60  Pulse: (!) 52 (!) 55  Resp: 13 20  Temp: (!) 36.2 C (!) 36.3 C  SpO2: 100% 100%    Last Pain:  Vitals:   06/15/20 2000  TempSrc: Core  PainSc:                  Erling Cruz. Railey Glad

## 2020-06-15 NOTE — Anesthesia Procedure Notes (Signed)
Arterial Line Insertion Start/End10/19/2021 1:08 PM Performed by: Rosalio Macadamia, CRNA, CRNA  Patient location: OR. Preanesthetic checklist: patient identified, IV checked, site marked, risks and benefits discussed, surgical consent, monitors and equipment checked, pre-op evaluation, timeout performed and anesthesia consent Lidocaine 1% used for infiltration and patient sedated Right, radial was placed Catheter size: 20 G Hand hygiene performed  and maximum sterile barriers used   Attempts: 2 Procedure performed without using ultrasound guided technique. Following insertion, dressing applied and Biopatch. Post procedure assessment: normal  Patient tolerated the procedure well with no immediate complications.

## 2020-06-15 NOTE — Transfer of Care (Signed)
Immediate Anesthesia Transfer of Care Note  Patient: Michael Franklin  Procedure(s) Performed: CORONARY ARTERY BYPASS GRAFTING (CABG) TIMES THREE USING HARVESTED LEFT RADIAL ARTERY, LEFT INTERNAL MAMMARY ARTERY,RIGHT GREATER SAPHENOUS VEIN,HARVESTED ENDOSCOPICALLY. Removal of balloon pump. (N/A Chest) TRANSESOPHAGEAL ECHOCARDIOGRAM (TEE) (N/A )  Patient Location: ICU  Anesthesia Type:General  Level of Consciousness: sedated and Patient remains intubated per anesthesia plan  Airway & Oxygen Therapy: Patient remains intubated per anesthesia plan and Patient placed on Ventilator (see vital sign flow sheet for setting)  Post-op Assessment: Report given to RN and Post -op Vital signs reviewed and stable ABP 116/63; spo2 100%  Post vital signs: Reviewed and stable  Last Vitals:  Vitals Value Taken Time  BP 108/72 06/15/20 1900  Temp 36.3 C 06/15/20 1906  Pulse 57 06/15/20 1906  Resp 14 06/15/20 1906  SpO2 100 % 06/15/20 1906  Vitals shown include unvalidated device data.  Last Pain:  Vitals:   06/15/20 1129  TempSrc: Oral  PainSc:          Complications: No complications documented.

## 2020-06-15 NOTE — Progress Notes (Signed)
Pt transferred to cath lab for IABP and will transfer to [facility] in [place] from cath lab.

## 2020-06-15 NOTE — Progress Notes (Signed)
Pt Cell phone, apple watch, work badge, Psychologist, occupational of shoe, shirt,  pant, black stereoscope was handed of to [place] lab.

## 2020-06-15 NOTE — Progress Notes (Signed)
EVENT PROGRESS NOTE    Ollis Daudelin  YDX:[id number] DOB: 10-17-1975 DOA: 06/14/2020 PCP: Wendall Stade, MD    Called to evaluate patient for severe chest pain post cath, shortness of breath.  He received 3 sublingual nitroglycerin with minimal improvement in chest pain.  Blood pressure did not tolerate nitroglycerin dropping to the 70s over 40s.  Cardiology was called who advised on normal saline bolus and started Levophed infusion with restarting of heparin infusion.  I was called to bedside.  At the time of my arrival, patient was having 10 out of 10 crushing chest pain radiating to left neck, associated with nausea and diaphoresis and shortness of breath. In discomfort due to pain. Bedside monitor read blood pressure still 76/41, heart rate in the 50s.  Please see rest of physical exam below.  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   Assessment & Plan:  Severe chest pain post cath not responding to SL NTG Hypotensive response to NTG -Discussed with cardiologist Dr. Azucena Cecil on phone during my evaluation at patient's bedside who guided therapy. -Currently on heparin infusion, nitroglycerin as well as Levophed -Patient's pain improved from 10/10 to 3/10 with interventions -Blood pressure improved and patient remains hemodynamically stable --Repeat EKGs showed no acute changes -Troponin trend:308>>333>>286 -CXR clear -Please see Dr. Merita Norton notes for further management   CRITICAL CARE Performed by: Andris Baumann   Total critical care time: 55 minutes  Critical care time was exclusive of separately billable procedures and treating other patients.  Critical care was necessary to treat or prevent imminent or life-threatening deterioration.  Critical care was time spent  personally by me on the following activities: development of treatment plan with patient and/or surrogate as well as nursing, discussions with consultants, evaluation of patient's response to treatment, examination of patient, obtaining history from patient or surrogate, ordering and performing treatments and interventions, ordering and review of laboratory studies, ordering and review of radiographic studies, pulse oximetry and re-evaluation of patient's condition.  Active Problems:   Chest pain   Non-ST elevation (NSTEMI) myocardial infarction (HCC)      Objective: Vitals:   06/15/20 0130 06/15/20 0135 06/15/20 0140 06/15/20 0145  BP: 124/89 125/88 128/87 129/89  Pulse: (!) 50 (!) 49 (!) 47 (!) 46  Resp: 14 13 13 11   Temp:      TempSrc:      SpO2: 99% 99% 100% 99%  Weight:      Height:        Intake/Output Summary (Last 24 hours) at 06/15/2020 0239 Last data filed at 06/15/2020 0138 Gross per 24 hour  Intake 1351.25 ml  Output 800 ml  Net 551.25 ml   Filed Weights   06/14/20 1704 06/14/20 2316  Weight: 77.7 kg 82.2 kg    Examination:  General exam: Alert and oriented x3.  In mild pain distress Respiratory system: Clear to auscultation. Respiratory effort slightly increased. Cardiovascular system: S1 & S2 heard, RRR. No JVD, murmurs, rubs, gallops or clicks. No pedal edema.  Data Reviewed: I have personally reviewed following labs and imaging studies  CBC: Recent Labs  Lab 06/13/20 1422 06/15/20 0059  WBC 5.7 7.2  NEUTROABS 2.7  --   HGB 14.6 14.0  HCT 43.2 40.0  MCV 86.2 84.6  PLT 257 234   Basic Metabolic Panel: Recent Labs  Lab 06/13/20 1420  NA 139  K 3.9  CL 101  CO2 26  GLUCOSE 110*  BUN 14  CREATININE 1.04  CALCIUM 9.3   GFR: Estimated Creatinine Clearance: 88.8 mL/min (by C-G formula based on SCr of 1.04 mg/dL). Liver Function Tests: Recent Labs  Lab 06/13/20 1420  AST 22  ALT 20  ALKPHOS 63  BILITOT 0.9  PROT 7.6  ALBUMIN 4.6    No results for input(s): LIPASE, AMYLASE in the last 168 hours. No results for input(s): AMMONIA in the last 168 hours. Coagulation Profile: Recent Labs  Lab 06/14/20 1844  INR 1.0   Cardiac Enzymes: No results for input(s): CKTOTAL, CKMB, CKMBINDEX, TROPONINI in the last 168 hours. BNP (last 3 results) No results for input(s): PROBNP in the last 8760 hours. HbA1C: No results for input(s): HGBA1C in the last 72 hours. CBG: No results for input(s): GLUCAP in the last 168 hours. Lipid Profile: Recent Labs    06/13/20 1420 06/14/20 1729  CHOL 225* 193  HDL 45 43  LDLCALC 161* 137*  TRIG 96 65  CHOLHDL 5.0 4.5   Thyroid Function Tests: Recent Labs    06/13/20 1420  TSH 1.663   Anemia Panel: No results for input(s): VITAMINB12, FOLATE, FERRITIN, TIBC, IRON, RETICCTPCT in the last 72 hours. Sepsis Labs: No results for input(s): PROCALCITON, LATICACIDVEN in the last 168 hours.  Recent Results (from the past 240 hour(s))  Resp Panel by RT PCR (RSV, Flu A&B, Covid) - Nasopharyngeal Swab     Status: None   Collection Time: 06/14/20  6:08 PM   Specimen: Nasopharyngeal Swab  Result Value Ref Range Status   SARS Coronavirus 2 by RT PCR NEGATIVE NEGATIVE Final    Comment: (NOTE) SARS-CoV-2 target nucleic acids are NOT DETECTED.  The SARS-CoV-2 RNA is generally detectable in upper respiratoy specimens during the acute phase of infection. The lowest concentration of SARS-CoV-2 viral copies this assay can detect is 131 copies/mL. A negative result does not preclude SARS-Cov-2 infection and should not be used as the sole basis for treatment or other patient management decisions. A negative result may occur with  improper specimen collection/handling, submission of specimen other than nasopharyngeal swab, presence of viral mutation(s) within the areas targeted by this assay, and inadequate number of viral copies (<131 copies/mL). A negative result must be combined with  clinical observations, patient history, and epidemiological information. The expected result is Negative.  Fact Sheet for Patients:  https://www.moore.com/  Fact Sheet for Healthcare Providers:  https://www.young.biz/  This test is no t yet approved or cleared by the [place] FDA and  has been authorized for detection and/or diagnosis of SARS-CoV-2 by FDA under an Emergency Use Authorization (EUA). This EUA will remain  in effect (meaning this test can be used) for the duration of the COVID-19 declaration under Section 564(b)(1) of the Act, 21 U.S.C. section 360bbb-3(b)(1), unless the authorization is terminated or revoked sooner.     Influenza A by PCR NEGATIVE NEGATIVE Final   Influenza B by PCR NEGATIVE NEGATIVE Final    Comment: (NOTE) The Xpert Xpress SARS-CoV-2/FLU/RSV assay is intended as an aid in  the diagnosis of influenza from Nasopharyngeal swab specimens and  should not be used as a sole basis for treatment. Nasal washings and  aspirates are unacceptable for Xpert Xpress SARS-CoV-2/FLU/RSV  testing.  Fact Sheet for Patients: https://www.moore.com/  Fact Sheet for Healthcare Providers: https://www.young.biz/  This test is not yet approved or cleared by the [place]  FDA and  has been authorized for detection and/or diagnosis of SARS-CoV-2 by  FDA under an Emergency Use Authorization (EUA). This EUA will remain  in effect (meaning this test can be used) for the duration of the  Covid-19 declaration under Section 564(b)(1) of the Act, 21  U.S.C. section 360bbb-3(b)(1), unless the authorization is  terminated or revoked.    Respiratory Syncytial Virus by PCR NEGATIVE NEGATIVE Final    Comment: (NOTE) Fact Sheet for Patients: https://www.moore.com/  Fact Sheet for Healthcare Providers: https://www.young.biz/  This test is not yet approved  or cleared by the [place] FDA and  has been authorized for detection and/or diagnosis of SARS-CoV-2 by  FDA under an Emergency Use Authorization (EUA). This EUA will remain  in effect (meaning this test can be used) for the duration of the  COVID-19 declaration under Section 564(b)(1) of the Act, 21 U.S.C.  section 360bbb-3(b)(1), unless the authorization is terminated or  revoked. Performed at [facility], [address] [place], [place] [address]   MRSA PCR Screening     Status: None   Collection Time: 06/14/20 11:22 PM   Specimen: Nasopharyngeal  Result Value Ref Range Status   MRSA by PCR NEGATIVE NEGATIVE Final    Comment:        The GeneXpert MRSA Assay (FDA approved for NASAL specimens only), is one component of a comprehensive MRSA colonization surveillance program. It is not intended to diagnose MRSA infection nor to guide or monitor treatment for MRSA infections. Performed at [facility] : [facility], [address], [place], [place] [address]          Radiology Studies: CARDIAC CATHETERIZATION  Result Date: 06/14/2020  Ost LAD to Prox LAD lesion is 100% stenosed.  Ramus lesion is 95% stenosed.  Ost Cx to Prox Cx lesion is 30% stenosed.  Mid Cx lesion is 30% stenosed.  Prox RCA to Mid RCA lesion is 30% stenosed.  RPDA-1 lesion is 90% stenosed.  RPDA-2 lesion is 80% stenosed.  There is mild left ventricular systolic dysfunction.  LV end diastolic pressure is moderately elevated.  The left ventricular ejection fraction is 45-50% by visual estimate.  1.  Severe three-vessel coronary artery disease.  Occluded ostial LAD with reasonable collaterals noted from RCA, 95% stenosis in large ostial ramus and severe right PDA disease. 2.  Mildly reduced LV systolic function with an EF of 50% with mild distal anterior and apical hypokinesis. 3.  Moderately elevated left ventricular end-diastolic pressure at 24 mmHg. Recommendations: I suspect that the  patient occluded his LAD last week on Thursday when he had prolonged chest pain.  It is possible that he had a pre-existing stenosis leading to preformed collaterals before the occlusion.  In spite of occluded LAD, ejection fraction is almost well-preserved.  Given ostial location of the LAD occlusion and ostial disease in the ramus, PCI is not suitable.  Recommend evaluation for CABG. Resume heparin 8 hours after sheath pull. No beta-blocker for now given bradycardia. The patient appears to be stable and we can plan to transfer the patient to [facility] tomorrow a.m.   DG CHEST PORT 1 VIEW  Result Date: 06/15/2020 CLINICAL DATA:  44 year old male with shortness of breath. EXAM: PORTABLE CHEST 1 VIEW COMPARISON:  Chest radiograph dated 06/14/2020. FINDINGS: No focal consolidation, pleural effusion, or pneumothorax. The cardiac silhouette is within limits. No acute osseous pathology. IMPRESSION: No active disease. Electronically Signed   By: Elgie Collard M.D.   On: 06/15/2020  01:39   ECHOCARDIOGRAM COMPLETE  Result Date: 06/14/2020    ECHOCARDIOGRAM REPORT   Patient Name:   Nevin BloodgoodSEYED Crabtree Date of Exam: 06/14/2020 Medical Rec #:  [id number]       Height:       69.0 in Accession #:    [id number]      Weight:       171.3 lb Date of Birth:  Mar 12, 1976       BSA:          1.934 m Patient Age:    44 years        BP:           148/94 mmHg Patient Gender: M               HR:           67 bpm. Exam Location:  [facility] Procedure: 2D Echo, Cardiac Doppler and Color Doppler                              MODIFIED REPORT: This report was modified by Lorine BearsMuhammad Arida MD on 06/14/2020 due to update.  Indications:     Chest Pain 786.50 / R07.9  History:         Patient has no prior history of Echocardiogram examinations.  Sonographer:     Neysa Bonitohristy Roar Referring Phys:  [id number] TOCHUKWU AGBATA Diagnosing Phys: Lorine BearsMuhammad Arida MD IMPRESSIONS  1. Left ventricular ejection fraction, by estimation, is 50 to 55%. The left  ventricle has low normal function. The left ventricle demonstrates regional wall motion abnormalities (see scoring diagram/findings for description). The left ventricular internal cavity size was mildly dilated. Left ventricular diastolic parameters were normal. There is mild hypokinesis of the left ventricular, apical anterior wall and apical segment.  2. Right ventricular systolic function is normal. The right ventricular size is normal. Tricuspid regurgitation signal is inadequate for assessing PA pressure.  3. The mitral valve is normal in structure. Mild mitral valve regurgitation. No evidence of mitral stenosis.  4. The aortic valve is normal in structure. Aortic valve regurgitation is not visualized. No aortic stenosis is present. FINDINGS  Left Ventricle: Left ventricular ejection fraction, by estimation, is 50 to 55%. The left ventricle has low normal function. The left ventricle demonstrates regional wall motion abnormalities. Mild hypokinesis of the left ventricular, apical anterior wall and apical segment. The left ventricular internal cavity size was mildly dilated. There is no left ventricular hypertrophy. Left ventricular diastolic parameters were normal. Right Ventricle: The right ventricular size is normal. No increase in right ventricular wall thickness. Right ventricular systolic function is normal. Tricuspid regurgitation signal is inadequate for assessing PA pressure. Left Atrium: Left atrial size was normal in size. Right Atrium: Right atrial size was normal in size. Pericardium: There is no evidence of pericardial effusion. Mitral Valve: The mitral valve is normal in structure. Mild mitral valve regurgitation. No evidence of mitral valve stenosis. Tricuspid Valve: The tricuspid valve is normal in structure. Tricuspid valve regurgitation is not demonstrated. No evidence of tricuspid stenosis. Aortic Valve: The aortic valve is normal in structure. Aortic valve regurgitation is not visualized. No  aortic stenosis is present. Aortic valve peak gradient measures 8.0 mmHg. Pulmonic Valve: The pulmonic valve was normal in structure. Pulmonic valve regurgitation is trivial. No evidence of pulmonic stenosis. Aorta: The aortic root is normal in size and structure. Venous: The inferior vena cava was not well visualized. IAS/Shunts: No atrial  level shunt detected by color flow Doppler.  LEFT VENTRICLE PLAX 2D LVIDd:         5.58 cm  Diastology LVIDs:         4.10 cm  LV e' medial:    8.59 cm/s LV PW:         0.93 cm  LV E/e' medial:  7.9 LV IVS:        1.13 cm  LV e' lateral:   12.60 cm/s LVOT diam:     2.10 cm  LV E/e' lateral: 5.4 LVOT Area:     3.46 cm  RIGHT VENTRICLE RV Mid diam:    3.09 cm RV S prime:     12.80 cm/s TAPSE (M-mode): 3.0 cm LEFT ATRIUM             Index       RIGHT ATRIUM           Index LA diam:        3.90 cm 2.02 cm/m  RA Area:     19.60 cm LA Vol (A2C):   80.4 ml 41.56 ml/m RA Volume:   57.70 ml  29.83 ml/m LA Vol (A4C):   65.1 ml 33.65 ml/m LA Biplane Vol: 74.5 ml 38.51 ml/m  AORTIC VALVE                PULMONIC VALVE AV Area (Vmax): 2.24 cm    PV Vmax:        1.11 m/s AV Vmax:        141.00 cm/s PV Peak grad:   4.9 mmHg AV Peak Grad:   8.0 mmHg    RVOT Peak grad: 3 mmHg LVOT Vmax:      91.30 cm/s  AORTA Ao Root diam: 2.90 cm MITRAL VALVE MV Area (PHT): 4.41 cm    SHUNTS MV Decel Time: 172 msec    Systemic Diam: 2.10 cm MV E velocity: 67.60 cm/s MV A velocity: 53.30 cm/s MV E/A ratio:  1.27 MV A Prime:    9.0 cm/s Lorine Bears MD Electronically signed by Lorine Bears MD Signature Date/Time: 06/14/2020/9:44:14 PM    Final (Updated)         Scheduled Meds: . aspirin  81 mg Oral Pre-Cath  . aspirin EC  81 mg Oral Daily  . influenza vac split quadrivalent PF  0.5 mL Intramuscular Tomorrow-1000  . isosorbide mononitrate  30 mg Oral Daily  . nitroGLYCERIN      . rosuvastatin  20 mg Oral Daily  . sodium chloride flush  3 mL Intravenous Q12H  . sodium chloride flush  3 mL  Intravenous Q12H   Continuous Infusions: . sodium chloride 50 mL/hr at 06/14/20 2303  . sodium chloride    . sodium chloride    . sodium chloride    . heparin 900 Units/hr (06/15/20 0049)  . nitroGLYCERIN 3 mcg/min (06/15/20 0200)     LOS: 0 days        Andris Baumann, MD [facility]

## 2020-06-15 NOTE — Anesthesia Procedure Notes (Signed)
Central Venous Catheter Insertion Performed by: Murvin Natal, MD, anesthesiologist Start/End10/19/2021 1:10 PM, 06/15/2020 1:25 PM Patient location: OR. Preanesthetic checklist: patient identified, IV checked, site marked, risks and benefits discussed, surgical consent, monitors and equipment checked, pre-op evaluation, timeout performed and anesthesia consent Position: Trendelenburg Hand hygiene performed , maximum sterile barriers used  and Seldinger technique used Catheter size: 9 Fr Total catheter length 12. PA cath was placed.MAC introducer Swan type:thermodilution PA Cath depth:42 Procedure performed using ultrasound guided technique. Ultrasound Notes:anatomy identified, needle tip was noted to be adjacent to the nerve/plexus identified and no ultrasound evidence of intravascular and/or intraneural injection Attempts: 1 Following insertion, line sutured, dressing applied and Biopatch. Post procedure assessment: blood return through all ports, free fluid flow and no air  Patient tolerated the procedure well with no immediate complications.

## 2020-06-15 NOTE — Op Note (Signed)
[address]       [place] [address]             [phone]                                          06/15/2020 Patient:  Michael Franklin Pre-Op Dx: Three-vessel coronary artery disease   NSTEMI   Hyperlipidemia Post-op Dx: Same Procedure: CABG X 3, LIMA LAD, left radial artery to ramus intermedius, reverse saphenous vein graft to PDA Open left radial artery harvest Endoscopic greater saphenous vein harvest on the left Intra-operative Transesophageal Echocardiogram  Surgeon and Role:      * Maclain Cohron, Eliezer Lofts, MD - Primary    Webb Laws, PA-C- assisting Assistant: T. Asa Lente, PA-C  Anesthesia  general EBL: 500 ml Blood Administration: None Xclamp Time: 49 min Pump Time: 94 min  Drains: 19 F blake drain:  R, L, mediastinal  Wires: None Counts: correct   Indications: 44 year old male from family history of early coronary artery disease.  He was transferred from [facility] after being admitted for an NSTEMI.  On 06/14/2020 he underwent a left heart cath which showed severe three-vessel coronary disease.  The following day he continued to have unstable angina was taken back to the Cath Lab for placement of an intra-aortic balloon pump, and subsequently transferred to [facility] for surgery.  Findings: Good conduit.  Large LIMA, large radial artery.  The LAD was heavily calcified.  The ramus intermedius was partially intramyocardial.  The PDA was a good target.  There were good flows on the vein graft and a radial artery graft.  The proximal portion of the radial artery was stripped off of the hood of the vein graft.  Operative Technique: All invasive lines were placed in pre-op holding.  After the risks, benefits and alternatives were thoroughly discussed, the patient was brought to the operative theatre.  Anesthesia was induced, and the patient was prepped and draped in normal sterile fashion.  An appropriate surgical pause was  performed, and pre-operative antibiotics were dosed accordingly.  We began with simultaneous incisions along the left leg for harvesting of the greater saphenous vein the left forearm for harvesting of the artery, and the chest for the sternotomy.  In regards to the sternotomy, this was carried down with bovie cautery, and the sternum was divided with a reciprocating saw.  Meticulous hemostasis was obtained.  The left internal thoracic artery was exposed and harvested in in pedicled fashion.  The patient was systemically heparinized, and the artery was divided distally, and placed in a papaverine sponge.    The sternal elevator was removed, and a retractor was placed.  The pericardium was divided in the midline and fashioned into a cradle with pericardial stitches.   After we confirmed an appropriate ACT, the ascending aorta was cannulated in standard fashion.  The right atrial appendage was used for venous cannulation site.  Cardiopulmonary bypass was initiated, and the heart retractor was placed. The cross clamp was applied, and a dose of anterograde cardioplegia was given with good arrest of the heart.  We moved to the posterior wall of the heart, and found a good target on the PDA.  An arteriotomy was made, and the vein graft was anastomosed to it in an end to side fashion.  Next we exposed the lateral wall,  and found a good target on the ramus intermedius.  An end to side anastomosis with the radial artery graft was then created.  Finally, we exposed a good target on the  LAD, and fashioned an end to side anastomosis between it and the LITA.  We began to re-warm, and a re-animation dose of cardioplegia was given.  The heart was de-aired, and the cross clamp was removed.  Meticulous hemostasis was obtained.    A partial occludding clamp was then placed on the ascending aorta, and we created an end to side anastomosis between it and the proximal vein graft.  The proximal site was marked with a ring.  The  proximal portion of the radial artery graft was then jumped off of the hood of the vein graft.  Hemostasis was obtained, and we separated from cardiopulmonary bypass without event.the heparin was reversed with protamine.  Chest tubes and wires were placed, and the sternum was re-approximated with with sternal wires.  The soft tissue and skin were re-approximated wth absorbable suture.    The patient tolerated the procedure without any immediate complications, and was transferred to the ICU in guarded condition.  Michael Franklin

## 2020-06-15 NOTE — Progress Notes (Signed)
ANTICOAGULATION CONSULT NOTE  Pharmacy Consult for Heparin infusion Indication: chest pain/ACS  No Known Allergies  Patient Measurements: Height: 5\' 9"  (175.3 cm) Weight: 82.2 kg (181 lb 3.5 oz) IBW/kg (Calculated) : 70.7  Vital Signs: Temp: 98.5 F (36.9 C) (10/18 1920) Temp Source: Oral (10/18 1920) BP: 119/82 (10/19 0000) Pulse Rate: 55 (10/19 0000)  Labs: Recent Labs    06/13/20 1420 06/13/20 1422 06/14/20 1729 06/14/20 1844  HGB  --  14.6  --   --   HCT  --  43.2  --   --   PLT  --  257  --   --   APTT  --   --   --  29  LABPROT  --   --   --  12.5  INR  --   --   --  1.0  CREATININE 1.04  --   --   --   TROPONINIHS  --   --  308* 333*    Estimated Creatinine Clearance: 88.8 mL/min (by C-G formula based on SCr of 1.04 mg/dL).   Medical History: No past medical history on file.  Medications:  Per chart review, patient not on anticoagulation prior to admission  Assessment: 44yo male with no significant medical history who presents with complaints of exertional chest pain which has increased in frequency and is associated with SOB. Patient had routine blood work done about 6 months ago and was found to have hyperlipidemia. He tried to take atorvastatin 20 mg daily but did not tolerate due to severe myalgia. EKG shows sinus rhythm with nonspecific T wave inversion. Cardiac enzymes pending. Pharmacy has been consulted for heparin dosing and monitoring for ACS.  Baseline H/H and platelets WNL aPTT and PT-INR pending  Goal of Therapy:  Heparin level 0.3-0.7 units/ml Monitor platelets by anticoagulation protocol: Yes   Plan:  Give 4000 units bolus x 1 Start heparin infusion at 900 units/hr Check anti-Xa level in 6 hours and daily while on heparin Continue to monitor H&H and platelets  Cardio recommending left heart catheterization possible PCI  10/19:  Pt still exeriencing chest pain so heparin gtt restarted @ 0100 per Dr 11/19 instead of 8 hrs post sheath  removal.  Will recheck HL 6 hrs after restart on 10/19 @ 0700.   Trinna Kunst D, PharmD 06/15/2020 1:01 AM

## 2020-06-15 NOTE — OR Nursing (Signed)
1st call to SICU charge nurse 

## 2020-06-15 NOTE — Brief Op Note (Signed)
06/15/2020  8:17 AM  PATIENT:  Michael Franklin  44 y.o. male  PRE-OPERATIVE DIAGNOSIS:  CAD  POST-OPERATIVE DIAGNOSIS:  CAD  PROCEDURE:  Procedure(s): CORONARY ARTERY BYPASS GRAFTING (CABG) TIMES THREE USING HARVESTED LEFT RADIAL ARTERY, LEFT INTERNAL MAMMARY ARTERY,RIGHT GREATER SAPHENOUS VEIN,HARVESTED ENDOSCOPICALLY. Removal of balloon pump. (N/A) TRANSESOPHAGEAL ECHOCARDIOGRAM (TEE) (N/A) LIMA-LAD SVG-PD LEFT RADIAL-RAMUS Radial 60 min EVH 45 min  SURGEON:  Surgeon(s) and Role:    * Lightfoot, Eliezer Lofts, MD - Primary  PHYSICIAN ASSISTANT: Brennin Durfee PA-C; TESSA CONTE PA-C  ASSISTANTS: STAFF   ANESTHESIA:   general  EBL:  550 mL   BLOOD ADMINISTERED:none  DRAINS: LEFT PLEURAL AND MEDIASTINAL CHEST TUBES   LOCAL MEDICATIONS USED:  NONE  SPECIMEN:  No Specimen  DISPOSITION OF SPECIMEN:  N/A  COUNTS:  YES  TOURNIQUET:  * No tourniquets in log *  DICTATION: .Dragon Dictation  PLAN OF CARE: Admit to inpatient   PATIENT DISPOSITION:  ICU - intubated and hemodynamically stable.   Delay start of Pharmacological VTE agent (>24hrs) due to surgical blood loss or risk of bleeding: yes  COMPLICATIONS: NO KNOWN

## 2020-06-15 NOTE — Progress Notes (Signed)
Late entering from 0006. At 0060, pt called and started to complain of Chest pain 8/10. Pt stated this pain now is different and is getting worse. Nitro SL given x3 without any relief. MD was called back and morphine 1mg  was given w/o any relief. Pain now increase to 10/10 and MD was called again. Zofran was given x2 for nausea at 0042. Pt BP dropped,NS bolus given and was started on Levo gtt, heparin was started also at 900unit/hr. Nitro gtt was started also and pt was given 2mg  of Morphine and at this time pt. Pt pain decreased to 3/10 and pt went to sleep.  At 0526 pt called again and stated pain is back again 8/10 and radiating now to his left neck and back shoulder. MD was called, nitro gtt was increased, morphine 2mg  given and fentanyl given as ordered. Dr at bed side now. pt stated no relief with all medications. Pt was transferred to cath lab for IABP and then transfer to [facility].

## 2020-06-15 NOTE — Progress Notes (Signed)
Event note  Called by RN regarding patient having symptoms of chestpain 10/10 and sob.  Vitals initially were normal with systolic BP in the 120s. SL nitro was given with minimal effect. BP became lower with systolic in the 90s. Repeat ekg was not significanlty changed from prior with new inferolateral TWI. Repeat troponin was better lower in the 200s from prior of 300s.  Heparin gtt restarted, levophed started for pressor support with improvement in BP to the 140s. Nitro drip started with improvement in symptoms of cp (now 3/10) and sob. cxr prelim review by myself with no pulm vasc congestion ( official rad read pending)  Recommendations -cont heparin gtt, cont nitro drip -titrate down levophed prn as BP permits -plan for transfer to [facility] for CABG consideration later today.  Plan discussed with RN, patient and covering in house hospitalist.  Signed, Debbe Odea, M.D. 06/15/20 [facility] [place], [place] [phone]

## 2020-06-15 NOTE — Discharge Summary (Signed)
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  His father had CABG at the age of 46.  The patient had routine blood work done about 6 months ago and he was found to have hyperlipidemia.  He tried to take atorvastatin 20 mg daily but did not tolerate the medication due to severe myalgia.  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  He played a soccer match on Thursday and after he finished, he had prolonged episode of substernal chest tightness that lasted for about an hour and subsided gradually.  After that, he started having exertional chest pain and tightness.  Yesterday, he tried to play a pickup game but was limited by exertional chest pain and thus he did not continue.  He came to work today and noted chest pain while he was going upstairs.  He had an EKG done which was normal at rest.  He got his heart rate up to around 100 and repeated the EKG which showed 2 mm of ST depression in the inferior leads with 1 to 2 mm of ST elevation in aVR.  Thus, it was arranged for the patient to get directly admitted to telemetry.  He had labs already.  When I interviewed him, he reports 3 out of 10 chest pain which was happening at rest.  Hospital Course:   # Unstable angina # NSTEMI Given ST depressions in ED with HR elevation admitted, given aspirin and started on heparin. Troponin elevated to the 300s, proceeded directly for urgent cath which showed severe three-vessel CAD (Occluded  ostial LAD with reasonable collaterals noted from RCA, 95% stenosis in large ostial ramus and severe right PDA disease.). EF 50% w/ mild distal anterior and apical hypokinesis. A couple of hours later developed severe substernal chest pain. Heparin re-started, nitro given. Systolics 90s, started on levophed gtt. Taken back to cath lab where intra-aortic balloon pump was placed, had vagal response w/ sheath placement and intermittent sinus bradycardia. - [facility]  Procedures:  Cardiac cath x2, placement of intraarterial balloon pump  Consultations:  cardiology  Discharge Exam: Vitals:   06/15/20 0530 06/15/20 0702  BP: 99/67 118/80  Pulse: (!) 53   Resp: 11 18  Temp: 99.8 F (37.7 C)   SpO2: 98% 98%    See progress note dated earlier today  Discharge Instructions   Discharge Instructions    Diet - low sodium heart healthy   Complete by: As directed    Increase activity slowly   Complete by: As directed      Allergies as of 06/15/2020   No Known Allergies     Medication List    STOP taking these medications   metoprolol tartrate 25 MG tablet Commonly known as: LOPRESSOR     TAKE these medications   aspirin 81 MG chewable tablet Chew 1 tablet (81 mg total) by mouth before cath procedure. Start taking on: June 16, 2020   heparin 25000-0.45 UT/250ML-% infusion  Inject 900 Units/hr into the vein continuous.   morphine 2 MG/ML injection Inject 1 mL (2 mg total) into the vein every 2 (two) hours as needed.   nitroGLYCERIN 0.4 MG SL tablet Commonly known as: NITROSTAT Place 1 tablet (0.4 mg total) under the tongue every 5 (five) minutes as needed for chest pain.   norepinephrine 4-5 MG/250ML-% Soln Commonly known as: LEVOPHED Inject 2-10 mcg/min into the vein continuous.   rosuvastatin 20 MG tablet Commonly known as: CRESTOR Take 1 tablet (20 mg total) by mouth daily.      No Known Allergies    The results of  significant diagnostics from this hospitalization (including imaging, microbiology, ancillary and laboratory) are listed below for reference.    Significant Diagnostic Studies: DG Chest 2 View  Result Date: 06/15/2020 CLINICAL DATA:  Chest discomfort. EXAM: CHEST - 2 VIEW COMPARISON:  06/15/2015. FINDINGS: Mediastinum hilar structures normal. Lungs are clear. No pleural effusion or pneumothorax. Heart size normal. No acute bony abnormality. Mild degenerative change thoracic spine. IMPRESSION: No acute cardiopulmonary disease. Electronically Signed   By: Maisie Fus  Register   On: 06/15/2020 05:44   CARDIAC CATHETERIZATION  Result Date: 06/15/2020 Successful intra-aortic balloon pump placement via the right common femoral artery.  The patient had a vagal response with sheath placement with bradycardia and continued to have intermittent sinus bradycardia. Recommendations: Continue heparin drip.  Transfer to [facility] for urgent CABG.  CARDIAC CATHETERIZATION  Result Date: 06/14/2020  Ost LAD to Prox LAD lesion is 100% stenosed.  Ramus lesion is 95% stenosed.  Ost Cx to Prox Cx lesion is 30% stenosed.  Mid Cx lesion is 30% stenosed.  Prox RCA to Mid RCA lesion is 30% stenosed.  RPDA-1 lesion is 90% stenosed.  RPDA-2 lesion is 80% stenosed.  There is mild left ventricular systolic dysfunction.  LV end diastolic pressure is moderately elevated.  The left ventricular ejection fraction is 45-50% by visual estimate.  1.  Severe three-vessel coronary artery disease.  Occluded ostial LAD with reasonable collaterals noted from RCA, 95% stenosis in large ostial ramus and severe right PDA disease. 2.  Mildly reduced LV systolic function with an EF of 50% with mild distal anterior and apical hypokinesis. 3.  Moderately elevated left ventricular end-diastolic pressure at 24 mmHg. Recommendations: I suspect that the patient occluded his LAD last week on Thursday when he had prolonged chest pain.  It is possible  that he had a pre-existing stenosis leading to preformed collaterals before the occlusion.  In spite of occluded LAD, ejection fraction is almost well-preserved.  Given ostial location of the LAD occlusion and ostial disease in the ramus, PCI is not suitable.  Recommend evaluation for CABG. Resume heparin 8 hours after sheath pull. No beta-blocker for now given bradycardia. The patient appears to be stable and we can plan to transfer the patient to [facility] tomorrow a.m.   DG CHEST PORT 1 VIEW  Result Date: 06/15/2020 CLINICAL DATA:  44 year old male with shortness of breath. EXAM: PORTABLE CHEST 1 VIEW COMPARISON:  Chest radiograph dated 06/14/2020. FINDINGS: No focal consolidation, pleural effusion, or pneumothorax. The cardiac silhouette is within limits. No acute osseous pathology. IMPRESSION: No active disease. Electronically Signed   By: Elgie Collard M.D.   On: 06/15/2020 01:39   ECHOCARDIOGRAM COMPLETE  Result Date: 06/14/2020    ECHOCARDIOGRAM REPORT   Patient Name:   ZAKERY NORMINGTON Date of Exam: 06/14/2020 Medical Rec #:  [id number]       Height:  69.0 in Accession #:    [id number]      Weight:       171.3 lb Date of Birth:  12/19/1975       BSA:          1.934 m Patient Age:    44 years        BP:           148/94 mmHg Patient Gender: M               HR:           67 bpm. Exam Location:  [facility] Procedure: 2D Echo, Cardiac Doppler and Color Doppler                              MODIFIED REPORT: This report was modified by Lorine Bears MD on 06/14/2020 due to update.  Indications:     Chest Pain 786.50 / R07.9  History:         Patient has no prior history of Echocardiogram examinations.  Sonographer:     Neysa Bonito Roar Referring Phys:  [id number] AGBATA Diagnosing Phys: Lorine Bears MD IMPRESSIONS  1. Left ventricular ejection fraction, by estimation, is 50 to 55%. The left ventricle has low normal function. The left ventricle demonstrates regional wall motion abnormalities  (see scoring diagram/findings for description). The left ventricular internal cavity size was mildly dilated. Left ventricular diastolic parameters were normal. There is mild hypokinesis of the left ventricular, apical anterior wall and apical segment.  2. Right ventricular systolic function is normal. The right ventricular size is normal. Tricuspid regurgitation signal is inadequate for assessing PA pressure.  3. The mitral valve is normal in structure. Mild mitral valve regurgitation. No evidence of mitral stenosis.  4. The aortic valve is normal in structure. Aortic valve regurgitation is not visualized. No aortic stenosis is present. FINDINGS  Left Ventricle: Left ventricular ejection fraction, by estimation, is 50 to 55%. The left ventricle has low normal function. The left ventricle demonstrates regional wall motion abnormalities. Mild hypokinesis of the left ventricular, apical anterior wall and apical segment. The left ventricular internal cavity size was mildly dilated. There is no left ventricular hypertrophy. Left ventricular diastolic parameters were normal. Right Ventricle: The right ventricular size is normal. No increase in right ventricular wall thickness. Right ventricular systolic function is normal. Tricuspid regurgitation signal is inadequate for assessing PA pressure. Left Atrium: Left atrial size was normal in size. Right Atrium: Right atrial size was normal in size. Pericardium: There is no evidence of pericardial effusion. Mitral Valve: The mitral valve is normal in structure. Mild mitral valve regurgitation. No evidence of mitral valve stenosis. Tricuspid Valve: The tricuspid valve is normal in structure. Tricuspid valve regurgitation is not demonstrated. No evidence of tricuspid stenosis. Aortic Valve: The aortic valve is normal in structure. Aortic valve regurgitation is not visualized. No aortic stenosis is present. Aortic valve peak gradient measures 8.0 mmHg. Pulmonic Valve: The  pulmonic valve was normal in structure. Pulmonic valve regurgitation is trivial. No evidence of pulmonic stenosis. Aorta: The aortic root is normal in size and structure. Venous: The inferior vena cava was not well visualized. IAS/Shunts: No atrial level shunt detected by color flow Doppler.  LEFT VENTRICLE PLAX 2D LVIDd:         5.58 cm  Diastology LVIDs:         4.10 cm  LV e' medial:    8.59  cm/s LV PW:         0.93 cm  LV E/e' medial:  7.9 LV IVS:        1.13 cm  LV e' lateral:   12.60 cm/s LVOT diam:     2.10 cm  LV E/e' lateral: 5.4 LVOT Area:     3.46 cm  RIGHT VENTRICLE RV Mid diam:    3.09 cm RV S prime:     12.80 cm/s TAPSE (M-mode): 3.0 cm LEFT ATRIUM             Index       RIGHT ATRIUM           Index LA diam:        3.90 cm 2.02 cm/m  RA Area:     19.60 cm LA Vol (A2C):   80.4 ml 41.56 ml/m RA Volume:   57.70 ml  29.83 ml/m LA Vol (A4C):   65.1 ml 33.65 ml/m LA Biplane Vol: 74.5 ml 38.51 ml/m  AORTIC VALVE                PULMONIC VALVE AV Area (Vmax): 2.24 cm    PV Vmax:        1.11 m/s AV Vmax:        141.00 cm/s PV Peak grad:   4.9 mmHg AV Peak Grad:   8.0 mmHg    RVOT Peak grad: 3 mmHg LVOT Vmax:      91.30 cm/s  AORTA Ao Root diam: 2.90 cm MITRAL VALVE MV Area (PHT): 4.41 cm    SHUNTS MV Decel Time: 172 msec    Systemic Diam: 2.10 cm MV E velocity: 67.60 cm/s MV A velocity: 53.30 cm/s MV E/A ratio:  1.27 MV A Prime:    9.0 cm/s Lorine Bears MD Electronically signed by Lorine Bears MD Signature Date/Time: 06/14/2020/9:44:14 PM    Final (Updated)     Microbiology: Recent Results (from the past 240 hour(s))  Resp Panel by RT PCR (RSV, Flu A&B, Covid) - Nasopharyngeal Swab     Status: None   Collection Time: 06/14/20  6:08 PM   Specimen: Nasopharyngeal Swab  Result Value Ref Range Status   SARS Coronavirus 2 by RT PCR NEGATIVE NEGATIVE Final    Comment: (NOTE) SARS-CoV-2 target nucleic acids are NOT DETECTED.  The SARS-CoV-2 RNA is generally detectable in upper  respiratoy specimens during the acute phase of infection. The lowest concentration of SARS-CoV-2 viral copies this assay can detect is 131 copies/mL. A negative result does not preclude SARS-Cov-2 infection and should not be used as the sole basis for treatment or other patient management decisions. A negative result may occur with  improper specimen collection/handling, submission of specimen other than nasopharyngeal swab, presence of viral mutation(s) within the areas targeted by this assay, and inadequate number of viral copies (<131 copies/mL). A negative result must be combined with clinical observations, patient history, and epidemiological information. The expected result is Negative.  Fact Sheet for Patients:  https://www.moore.com/  Fact Sheet for Healthcare Providers:  https://www.young.biz/  This test is no t yet approved or cleared by the [place] FDA and  has been authorized for detection and/or diagnosis of SARS-CoV-2 by FDA under an Emergency Use Authorization (EUA). This EUA will remain  in effect (meaning this test can be used) for the duration of the COVID-19 declaration under Section 564(b)(1) of the Act, 21 U.S.C. section 360bbb-3(b)(1), unless the authorization is terminated or revoked sooner.  Influenza A by PCR NEGATIVE NEGATIVE Final   Influenza B by PCR NEGATIVE NEGATIVE Final    Comment: (NOTE) The Xpert Xpress SARS-CoV-2/FLU/RSV assay is intended as an aid in  the diagnosis of influenza from Nasopharyngeal swab specimens and  should not be used as a sole basis for treatment. Nasal washings and  aspirates are unacceptable for Xpert Xpress SARS-CoV-2/FLU/RSV  testing.  Fact Sheet for Patients: https://www.moore.com/  Fact Sheet for Healthcare Providers: https://www.young.biz/  This test is not yet approved or cleared by the [place] FDA and  has been  authorized for detection and/or diagnosis of SARS-CoV-2 by  FDA under an Emergency Use Authorization (EUA). This EUA will remain  in effect (meaning this test can be used) for the duration of the  Covid-19 declaration under Section 564(b)(1) of the Act, 21  U.S.C. section 360bbb-3(b)(1), unless the authorization is  terminated or revoked.    Respiratory Syncytial Virus by PCR NEGATIVE NEGATIVE Final    Comment: (NOTE) Fact Sheet for Patients: https://www.moore.com/  Fact Sheet for Healthcare Providers: https://www.young.biz/  This test is not yet approved or cleared by the [place] FDA and  has been authorized for detection and/or diagnosis of SARS-CoV-2 by  FDA under an Emergency Use Authorization (EUA). This EUA will remain  in effect (meaning this test can be used) for the duration of the  COVID-19 declaration under Section 564(b)(1) of the Act, 21 U.S.C.  section 360bbb-3(b)(1), unless the authorization is terminated or  revoked. Performed at [facility], [address] [place], [place] [address]   MRSA PCR Screening     Status: None   Collection Time: 06/14/20 11:22 PM   Specimen: Nasopharyngeal  Result Value Ref Range Status   MRSA by PCR NEGATIVE NEGATIVE Final    Comment:        The GeneXpert MRSA Assay (FDA approved for NASAL specimens only), is one component of a comprehensive MRSA colonization surveillance program. It is not intended to diagnose MRSA infection nor to guide or monitor treatment for MRSA infections. Performed at [facility], [address] [place], [place] [address]      Labs: Basic Metabolic Panel: Recent Labs  Lab 06/13/20 1420  NA 139  K 3.9  CL 101  CO2 26  GLUCOSE 110*  BUN 14  CREATININE 1.04  CALCIUM 9.3   Liver Function Tests: Recent Labs  Lab 06/13/20 1420  AST 22  ALT 20  ALKPHOS 63  BILITOT 0.9  PROT 7.6  ALBUMIN 4.6   No results for input(s):  LIPASE, AMYLASE in the last 168 hours. No results for input(s): AMMONIA in the last 168 hours. CBC: Recent Labs  Lab 06/13/20 1422 06/15/20 0059  WBC 5.7 7.2  NEUTROABS 2.7  --   HGB 14.6 14.0  HCT 43.2 40.0  MCV 86.2 84.6  PLT 257 234   Cardiac Enzymes: No results for input(s): CKTOTAL, CKMB, CKMBINDEX, TROPONINI in the last 168 hours. BNP: BNP (last 3 results) No results for input(s): BNP in the last 8760 hours.  ProBNP (last 3 results) No results for input(s): PROBNP in the last 8760 hours.  CBG: No results for input(s): GLUCAP in the last 168 hours.     Signed:  Silvano Bilis MD.  [facility] 06/15/2020, 7:37 AM

## 2020-06-15 NOTE — Progress Notes (Signed)
RT note. Pt. Flipped back to SIMV, PSV, PRVC, due to ABG not being within parameters. Will try again, RN calling Dr. Donata Clay. RT will continue to monitor.

## 2020-06-15 NOTE — Progress Notes (Signed)
  Echocardiogram Echocardiogram Transesophageal has been performed.  Celene Skeen 06/15/2020, 1:56 PM

## 2020-06-15 NOTE — Progress Notes (Addendum)
EVENT NOTE  Addendum to note dictated earlier Patient had initial pain relief with early intervention however pain restarted at around 5:30am and of severe intensity. Typical pain similar to prior episode, crushing retrosternal and radiating to the neck Still no acute EKG changes. Discussion via secure chat with Dr. Kirke Corin. Fentanyl ordered. Decision made for placement of intra-aortic balloon pump and transfer to [facility]. Called [facility] to initiate transfer however Dr Kirke Corin stated he will call for accepting physician on completion on the IABP.

## 2020-06-15 NOTE — Anesthesia Procedure Notes (Signed)
Procedure Name: Intubation Date/Time: 06/15/2020 1:06 PM Performed by: Shirlyn Goltz, CRNA Pre-anesthesia Checklist: Patient identified, Emergency Drugs available, Suction available and Patient being monitored Patient Re-evaluated:Patient Re-evaluated prior to induction Oxygen Delivery Method: Circle system utilized Preoxygenation: Pre-oxygenation with 100% oxygen Induction Type: IV induction Ventilation: Mask ventilation without difficulty Laryngoscope Size: Mac and 4 Grade View: Grade I Tube type: Oral Tube size: 8.0 mm Number of attempts: 1 Airway Equipment and Method: Stylet Placement Confirmation: ETT inserted through vocal cords under direct vision,  positive ETCO2 and breath sounds checked- equal and bilateral Secured at: 23 cm Tube secured with: Tape Dental Injury: Teeth and Oropharynx as per pre-operative assessment

## 2020-06-15 NOTE — Consult Note (Addendum)
[address]       [place] [id number]             [phone]        [facility]eyed [facility] Record #[id number] Date of Birth: 01/14/1976  Referring: No ref. provider found Primary Care: Wendall StadeNishan, Peter C, MD Primary Cardiologist:No primary care provider on file.  Chief Complaint:   Chest Pain  History of Present Illness:    The patient is a 44 year old male with no previous documented history of cardiac disease.  He does have a known family history with father having CABG at age 46.  He was recently diagnosed approximately 6 months ago with hyperlipidemia.  He was prescribed atorvastatin but this was discontinued due to severe myalgia symptoms.  The patient is quite physically active and plays soccer frequently.  During a match on Thursday of last week he developed an episode of prolonged substernal chest tightness that lasted for approximately 1 hour and then subsided.  He continued to have some symptoms with exertion and attempting to play another game the pain worsened he was unable to continue.  On 06/13/2020 he had an episode at work while going up the stairs prompting further evaluation.  An EKG was done at rest and was unremarkable.  A repeat EKG with some activity showed a 2 mm ST depression in the inferior leads with a 1 to 2 mm ST elevation in lead aVR.  The patient was admitted for further evaluation and treatment to include cardiology consultation. Troponins (HS)peaked at 333 . LVEF was estimated at 50 to 55% and the full report is listed below.  Cardiac catheterization shows severe three-vessel coronary artery disease with occluded ostial LAD with some collaterals from the RCA, 95% stenosis in a large ostial ramus and severe right PDA disease.  EF was 50% with apical hypokinesis and mildly elevated LVEDP at 24 mmHg.  Due to these findings an intra-aortic balloon pump has been placed this morning and [facility] was [facility]  coronary artery revascularization by Dr. Cliffton AstersLightfoot    Current Activity/ Functional Status: Patient is independent with mobility/ambulation, transfers, ADL's, IADL's.   Zubrod Score: At the time of surgery this patient's most appropriate activity status/level should be described as: []     0    Normal activity, no symptoms [x]     1    Restricted in physical strenuous activity but ambulatory, able to do out light work []     2    Ambulatory and capable of self care, unable to do work activities, up and about                 more than 50%  Of the time                            []     3    Only limited self care, in bed greater than 50% of waking hours []     4    Completely disabled, no self care, confined to bed or chair []     5    Moribund  No past medical history on file.  Patient Active Problem List   Diagnosis Date Noted  . NSTEMI (non-ST elevated myocardial infarction) (HCC) 06/15/2020  . Chest pain 06/14/2020  . Non-ST elevation (NSTEMI) myocardial infarction [facility](HCC)     Social History   Tobacco Use  Smoking Status Never Smoker  Social History   Substance and Sexual Activity  Alcohol Use Never     No Known Allergies  No current facility-administered medications for this encounter.    Medications Prior to Admission  Medication Sig Dispense Refill Last Dose  . [START ON 06/16/2020] aspirin 81 MG chewable tablet Chew 1 tablet (81 mg total) by mouth before cath procedure.     . heparin 25000-0.45 UT/250ML-% infusion Inject 900 Units/hr into the vein continuous.     Marland Kitchen morphine 2 MG/ML injection Inject 1 mL (2 mg total) into the vein every 2 (two) hours as needed. 1 mL 0   . nitroGLYCERIN (NITROSTAT) 0.4 MG SL tablet Place 1 tablet (0.4 mg total) under the tongue every 5 (five) minutes as needed for chest pain.  12   . norepinephrine (LEVOPHED) 4-5 MG/250ML-% SOLN Inject 2-10 mcg/min into the vein continuous.     . rosuvastatin (CRESTOR) 20 MG tablet Take 1 tablet (20 mg  total) by mouth daily.      Current Meds  Medication Sig  . amLODipine (NORVASC) 10 MG tablet Take 10 mg by mouth daily.     Family History  Problem Relation Age of Onset  . Asthma Mother   . Hypertension Father   . CAD Father   . Hyperlipidemia Father      Review of Systems:   Review of Systems  Cardiovascular: Positive for chest pain.  All other systems reviewed and are negative.      Physical Exam: There were no vitals taken for this visit.   Physical Exam  Constitutional: No distress. He appears acutely ill.  HENT:  Nose: No nasal discharge.  Mouth/Throat: No dental caries. Oropharynx is clear. Pharynx is normal.  Eyes: Pupils are equal, round, and reactive to light. Conjunctivae are normal.  Neck: Thyroid normal. No JVD present. No neck adenopathy. No thyromegaly present.  Cardiovascular: Normal rate, regular rhythm, normal heart sounds, intact distal pulses and normal pulses. Exam reveals no gallop.  No murmur heard. Transmitted sounds from IABP throughout chest and into abdomen and carotids  Pulmonary/Chest: Breath sounds normal. He has no wheezes. He has no rales. He exhibits no tenderness.  Abdominal: Soft. Bowel sounds are normal. He exhibits no distension and no mass. There is no hepatomegaly. There is no abdominal tenderness.  Musculoskeletal:        General: No tenderness, deformity or edema.     Cervical back: Normal range of motion and neck supple.  Neurological: He is oriented to person, place, and time.  Skin: Skin is warm and dry. No cyanosis. No jaundice or pallor. Nails show no clubbing.    Diagnostic Studies & Laboratory data:     Recent Radiology Findings:   DG Chest 2 View  Result Date: 06/15/2020 CLINICAL DATA:  Chest discomfort. EXAM: CHEST - 2 VIEW COMPARISON:  06/15/2015. FINDINGS: Mediastinum hilar structures normal. Lungs are clear. No pleural effusion or pneumothorax. Heart size normal. No acute bony abnormality. Mild degenerative  change thoracic spine. IMPRESSION: No acute cardiopulmonary disease. Electronically Signed   By: Maisie Fus  Register   On: 06/15/2020 05:44   CARDIAC CATHETERIZATION  Result Date: 06/15/2020 Successful intra-aortic balloon pump placement via the right common femoral artery.  The patient had a vagal response with sheath placement with bradycardia and continued to have intermittent sinus bradycardia. Recommendations: Continue heparin drip.  Transfer to [facility] for urgent CABG.  CARDIAC CATHETERIZATION  Result Date: 06/14/2020  Ost LAD to Prox LAD lesion is 100% stenosed.  Ramus  lesion is 95% stenosed.  Ost Cx to Prox Cx lesion is 30% stenosed.  Mid Cx lesion is 30% stenosed.  Prox RCA to Mid RCA lesion is 30% stenosed.  RPDA-1 lesion is 90% stenosed.  RPDA-2 lesion is 80% stenosed.  There is mild left ventricular systolic dysfunction.  LV end diastolic pressure is moderately elevated.  The left ventricular ejection fraction is 45-50% by visual estimate.  1.  Severe three-vessel coronary artery disease.  Occluded ostial LAD with reasonable collaterals noted from RCA, 95% stenosis in large ostial ramus and severe right PDA disease. 2.  Mildly reduced LV systolic function with an EF of 50% with mild distal anterior and apical hypokinesis. 3.  Moderately elevated left ventricular end-diastolic pressure at 24 mmHg. Recommendations: I suspect that the patient occluded his LAD last week on Thursday when he had prolonged chest pain.  It is possible that he had a pre-existing stenosis leading to preformed collaterals before the occlusion.  In spite of occluded LAD, ejection fraction is almost well-preserved.  Given ostial location of the LAD occlusion and ostial disease in the ramus, PCI is not suitable.  Recommend evaluation for CABG. Resume heparin 8 hours after sheath pull. No beta-blocker for now given bradycardia. The patient appears to be stable and we can plan to transfer the patient to [facility]  tomorrow a.m.   DG CHEST PORT 1 VIEW  Result Date: 06/15/2020 CLINICAL DATA:  44 year old male with shortness of breath. EXAM: PORTABLE CHEST 1 VIEW COMPARISON:  Chest radiograph dated 06/14/2020. FINDINGS: No focal consolidation, pleural effusion, or pneumothorax. The cardiac silhouette is within limits. No acute osseous pathology. IMPRESSION: No active disease. Electronically Signed   By: Elgie Collard M.D.   On: 06/15/2020 01:39   ECHOCARDIOGRAM COMPLETE  Result Date: 06/14/2020    ECHOCARDIOGRAM REPORT   Patient Name:   RUEBEN KASSIM Date of Exam: 06/14/2020 Medical Rec #:  [id number]       Height:       69.0 in Accession #:    [id number]      Weight:       171.3 lb Date of Birth:  10-23-1975       BSA:          1.934 m Patient Age:    44 years        BP:           148/94 mmHg Patient Gender: M               HR:           67 bpm. Exam Location:  [facility] Procedure: 2D Echo, Cardiac Doppler and Color Doppler                              MODIFIED REPORT: This report was modified by Lorine Bears MD on 06/14/2020 due to update.  Indications:     Chest Pain 786.50 / R07.9  History:         Patient has no prior history of Echocardiogram examinations.  Sonographer:     Neysa Bonito Roar Referring Phys:  [id number] DUKGURKY AGBATA Diagnosing Phys: Lorine Bears MD IMPRESSIONS  1. Left ventricular ejection fraction, by estimation, is 50 to 55%. The left ventricle has low normal function. The left ventricle demonstrates regional wall motion abnormalities (see scoring diagram/findings for description). The left ventricular internal cavity size was mildly dilated. Left ventricular diastolic parameters were normal. There is mild  hypokinesis of the left ventricular, apical anterior wall and apical segment.  2. Right ventricular systolic function is normal. The right ventricular size is normal. Tricuspid regurgitation signal is inadequate for assessing PA pressure.  3. The mitral valve is normal in structure. Mild mitral  valve regurgitation. No evidence of mitral stenosis.  4. The aortic valve is normal in structure. Aortic valve regurgitation is not visualized. No aortic stenosis is present. FINDINGS  Left Ventricle: Left ventricular ejection fraction, by estimation, is 50 to 55%. The left ventricle has low normal function. The left ventricle demonstrates regional wall motion abnormalities. Mild hypokinesis of the left ventricular, apical anterior wall and apical segment. The left ventricular internal cavity size was mildly dilated. There is no left ventricular hypertrophy. Left ventricular diastolic parameters were normal. Right Ventricle: The right ventricular size is normal. No increase in right ventricular wall thickness. Right ventricular systolic function is normal. Tricuspid regurgitation signal is inadequate for assessing PA pressure. Left Atrium: Left atrial size was normal in size. Right Atrium: Right atrial size was normal in size. Pericardium: There is no evidence of pericardial effusion. Mitral Valve: The mitral valve is normal in structure. Mild mitral valve regurgitation. No evidence of mitral valve stenosis. Tricuspid Valve: The tricuspid valve is normal in structure. Tricuspid valve regurgitation is not demonstrated. No evidence of tricuspid stenosis. Aortic Valve: The aortic valve is normal in structure. Aortic valve regurgitation is not visualized. No aortic stenosis is present. Aortic valve peak gradient measures 8.0 mmHg. Pulmonic Valve: The pulmonic valve was normal in structure. Pulmonic valve regurgitation is trivial. No evidence of pulmonic stenosis. Aorta: The aortic root is normal in size and structure. Venous: The inferior vena cava was not well visualized. IAS/Shunts: No atrial level shunt detected by color flow Doppler.  LEFT VENTRICLE PLAX 2D LVIDd:         5.58 cm  Diastology LVIDs:         4.10 cm  LV e' medial:    8.59 cm/s LV PW:         0.93 cm  LV E/e' medial:  7.9 LV IVS:        1.13 cm  LV e'  lateral:   12.60 cm/s LVOT diam:     2.10 cm  LV E/e' lateral: 5.4 LVOT Area:     3.46 cm  RIGHT VENTRICLE RV Mid diam:    3.09 cm RV S prime:     12.80 cm/s TAPSE (M-mode): 3.0 cm LEFT ATRIUM             Index       RIGHT ATRIUM           Index LA diam:        3.90 cm 2.02 cm/m  RA Area:     19.60 cm LA Vol (A2C):   80.4 ml 41.56 ml/m RA Volume:   57.70 ml  29.83 ml/m LA Vol (A4C):   65.1 ml 33.65 ml/m LA Biplane Vol: 74.5 ml 38.51 ml/m  AORTIC VALVE                PULMONIC VALVE AV Area (Vmax): 2.24 cm    PV Vmax:        1.11 m/s AV Vmax:        141.00 cm/s PV Peak grad:   4.9 mmHg AV Peak Grad:   8.0 mmHg    RVOT Peak grad: 3 mmHg LVOT Vmax:      91.30 cm/s  AORTA Ao  Root diam: 2.90 cm MITRAL VALVE MV Area (PHT): 4.41 cm    SHUNTS MV Decel Time: 172 msec    Systemic Diam: 2.10 cm MV E velocity: 67.60 cm/s MV A velocity: 53.30 cm/s MV E/A ratio:  1.27 MV A Prime:    9.0 cm/s Lorine Bears MD Electronically signed by Lorine Bears MD Signature Date/Time: 06/14/2020/9:44:14 PM    Final (Updated)      I have independently reviewed the above radiologic studies and discussed with the patient   Recent Lab Findings: Lab Results  Component Value Date   WBC 7.2 06/15/2020   HGB 14.0 06/15/2020   HCT 40.0 06/15/2020   PLT 234 06/15/2020   GLUCOSE 110 (H) 06/13/2020   CHOL 193 06/14/2020   TRIG 65 06/14/2020   HDL 43 06/14/2020   LDLCALC 137 (H) 06/14/2020   ALT 20 06/13/2020   AST 22 06/13/2020   NA 139 06/13/2020   K 3.9 06/13/2020   CL 101 06/13/2020   CREATININE 1.04 06/13/2020   BUN 14 06/13/2020   CO2 26 06/13/2020   TSH 1.663 06/13/2020   INR 1.0 06/14/2020      Assessment / Plan:    Severe three-vessel disease in the setting of non-STEMI.  Currently with intra-aortic balloon pump on nitro and Levophed.  Hyperlipidemia, may consider testing LP(a) in young otherwise healthy person.   Plan: Urgent coronary artery surgical revascularization  I  spent 55 minutes counseling  the patient face to face.   Rowe Clack, PA-C 06/15/2020 10:19 AM    Agree with above 44yo male presents with severe 3V CAD, and NSTEMI.  Ongoing chest pain s/p IABP placement.  Now chest pain free since started on Nitro.  OR today for CABG 3 and left radial artery harvest.  Corliss Skains

## 2020-06-15 NOTE — Anesthesia Preprocedure Evaluation (Addendum)
Anesthesia Evaluation  Patient identified by MRN, date of birth, ID band Patient awake    Reviewed: Allergy & Precautions, NPO status , Patient's Chart, lab work & pertinent test results  Airway Mallampati: II  TM Distance: >3 FB Neck ROM: Full    Dental no notable dental hx.    Pulmonary neg pulmonary ROS,    Pulmonary exam normal breath sounds clear to auscultation       Cardiovascular + angina + CAD and + Past MI  Normal cardiovascular exam Rhythm:Regular Rate:Normal  ECG: SR, rate   ECHO: 1. Left ventricular ejection fraction, by estimation, is 50 to 55%. The left ventricle has low normal function. The left ventricle demonstrates regional wall motion abnormalities (see scoring diagram/findings for description). The left ventricular internal cavity size was mildly dilated. Left ventricular diastolic parameters were normal. There is mild hypokinesis of the left ventricular, apical anterior wall and apical segment.  2. Right ventricular systolic function is normal. The right ventricular size is normal. Tricuspid regurgitation signal is inadequate for assessing PA pressure.  3. The mitral valve is normal in structure. Mild mitral valve  regurgitation. No evidence of mitral stenosis.  4. The aortic valve is normal in structure. Aortic valve regurgitation is not visualized. No aortic stenosis is present   Neuro/Psych negative neurological ROS  negative psych ROS   GI/Hepatic negative GI ROS, Neg liver ROS,   Endo/Other  negative endocrine ROS  Renal/GU negative Renal ROS     Musculoskeletal negative musculoskeletal ROS (+)   Abdominal   Peds  Hematology HLD   Anesthesia Other Findings CAD  Reproductive/Obstetrics                            Anesthesia Physical Anesthesia Plan  ASA: IV  Anesthesia Plan: General   Post-op Pain Management:    Induction: Intravenous  PONV Risk Score and  Plan: 2 and Ondansetron, Dexamethasone, Midazolam and Treatment may vary due to age or medical condition  Airway Management Planned: Oral ETT  Additional Equipment: Arterial line, CVP, TEE, PA Cath and Ultrasound Guidance Line Placement  Intra-op Plan:   Post-operative Plan: Post-operative intubation/ventilation  Informed Consent: I have reviewed the patients History and Physical, chart, labs and discussed the procedure including the risks, benefits and alternatives for the proposed anesthesia with the patient or authorized representative who has indicated his/her understanding and acceptance.     Dental advisory given  Plan Discussed with: CRNA and Surgeon  Anesthesia Plan Comments: (IABP in place)       Anesthesia Quick Evaluation

## 2020-06-16 ENCOUNTER — Inpatient Hospital Stay (HOSPITAL_COMMUNITY): Payer: 59

## 2020-06-16 ENCOUNTER — Ambulatory Visit (HOSPITAL_COMMUNITY): Payer: 59

## 2020-06-16 ENCOUNTER — Encounter (HOSPITAL_COMMUNITY): Payer: Self-pay | Admitting: Thoracic Surgery (Cardiothoracic Vascular Surgery)

## 2020-06-16 DIAGNOSIS — Z951 Presence of aortocoronary bypass graft: Secondary | ICD-10-CM

## 2020-06-16 LAB — BASIC METABOLIC PANEL
Anion gap: 8 (ref 5–15)
Anion gap: 7 (ref 5–15)
BUN: 11 mg/dL (ref 6–20)
BUN: 9 mg/dL (ref 6–20)
CO2: 22 mmol/L (ref 22–32)
CO2: 26 mmol/L (ref 22–32)
Calcium: 7.8 mg/dL — ABNORMAL LOW (ref 8.9–10.3)
Calcium: 8.3 mg/dL — ABNORMAL LOW (ref 8.9–10.3)
Chloride: 108 mmol/L (ref 98–111)
Chloride: 102 mmol/L (ref 98–111)
Creatinine, Ser: 0.92 mg/dL (ref 0.61–1.24)
Creatinine, Ser: 1.04 mg/dL (ref 0.61–1.24)
GFR, Estimated: >60 mL/min (ref >60)
GFR, Estimated: >60 mL/min (ref >60)
Glucose, Bld: 163 mg/dL — ABNORMAL HIGH (ref 70–99)
Glucose, Bld: 143 mg/dL — ABNORMAL HIGH (ref 70–99)
Potassium: 3.9 mmol/L (ref 3.5–5.1)
Potassium: 3.5 mmol/L (ref 3.5–5.1)
Sodium: 138 mmol/L (ref 135–145)
Sodium: 135 mmol/L (ref 135–145)

## 2020-06-16 LAB — GLUCOSE, CAPILLARY
Glucose-Capillary: 133 mg/dL — ABNORMAL HIGH (ref 70–99)
Glucose-Capillary: 139 mg/dL — ABNORMAL HIGH (ref 70–99)
Glucose-Capillary: 144 mg/dL — ABNORMAL HIGH (ref 70–99)
Glucose-Capillary: 151 mg/dL — ABNORMAL HIGH (ref 70–99)
Glucose-Capillary: 153 mg/dL — ABNORMAL HIGH (ref 70–99)
Glucose-Capillary: 160 mg/dL — ABNORMAL HIGH (ref 70–99)
Glucose-Capillary: 161 mg/dL — ABNORMAL HIGH (ref 70–99)
Glucose-Capillary: 173 mg/dL — ABNORMAL HIGH (ref 70–99)
Glucose-Capillary: 131 mg/dL — ABNORMAL HIGH (ref 70–99)
Glucose-Capillary: 142 mg/dL — ABNORMAL HIGH (ref 70–99)
Glucose-Capillary: 104 mg/dL — ABNORMAL HIGH (ref 70–99)
Glucose-Capillary: 141 mg/dL — ABNORMAL HIGH (ref 70–99)

## 2020-06-16 LAB — POCT I-STAT 7, (LYTES, BLD GAS, ICA,H+H)
Acid-base deficit: 2 mmol/L (ref 0.0–2.0)
Acid-base deficit: 3 mmol/L — ABNORMAL HIGH (ref 0.0–2.0)
Bicarbonate: 23.2 mmol/L (ref 20.0–28.0)
Bicarbonate: 24.1 mmol/L (ref 20.0–28.0)
Calcium, Ion: 1.17 mmol/L (ref 1.15–1.40)
Calcium, Ion: 1.18 mmol/L (ref 1.15–1.40)
HCT: 30 % — ABNORMAL LOW (ref 39.0–52.0)
HCT: 31 % — ABNORMAL LOW (ref 39.0–52.0)
Hemoglobin: 10.2 g/dL — ABNORMAL LOW (ref 13.0–17.0)
Hemoglobin: 10.5 g/dL — ABNORMAL LOW (ref 13.0–17.0)
O2 Saturation: 94 %
O2 Saturation: 97 %
Patient temperature: 37.2
Patient temperature: 37.3
Potassium: 3.8 mmol/L (ref 3.5–5.1)
Potassium: 4 mmol/L (ref 3.5–5.1)
Sodium: 140 mmol/L (ref 135–145)
Sodium: 140 mmol/L (ref 135–145)
TCO2: 24 mmol/L (ref 22–32)
TCO2: 26 mmol/L (ref 22–32)
pCO2 arterial: 44.6 mmHg (ref 32.0–48.0)
pCO2 arterial: 48.6 mmHg — ABNORMAL HIGH (ref 32.0–48.0)
pH, Arterial: 7.305 — ABNORMAL LOW (ref 7.350–7.450)
pH, Arterial: 7.325 — ABNORMAL LOW (ref 7.350–7.450)
pO2, Arterial: 107 mmHg (ref 83.0–108.0)
pO2, Arterial: 78 mmHg — ABNORMAL LOW (ref 83.0–108.0)

## 2020-06-16 LAB — CBC
HCT: 33 % — ABNORMAL LOW (ref 39.0–52.0)
HCT: 32.5 % — ABNORMAL LOW (ref 39.0–52.0)
Hemoglobin: 11.2 g/dL — ABNORMAL LOW (ref 13.0–17.0)
Hemoglobin: 10.6 g/dL — ABNORMAL LOW (ref 13.0–17.0)
MCH: 30 pg (ref 26.0–34.0)
MCH: 29.4 pg (ref 26.0–34.0)
MCHC: 33.9 g/dL (ref 30.0–36.0)
MCHC: 32.6 g/dL (ref 30.0–36.0)
MCV: 88.5 fL (ref 80.0–100.0)
MCV: 90 fL (ref 80.0–100.0)
Platelets: 186 10*3/uL (ref 150–400)
Platelets: 163 10*3/uL (ref 150–400)
RBC: 3.73 MIL/uL — ABNORMAL LOW (ref 4.22–5.81)
RBC: 3.61 MIL/uL — ABNORMAL LOW (ref 4.22–5.81)
RDW: 12.7 % (ref 11.5–15.5)
RDW: 12.9 % (ref 11.5–15.5)
WBC: 12.7 10*3/uL — ABNORMAL HIGH (ref 4.0–10.5)
WBC: 12.6 10*3/uL — ABNORMAL HIGH (ref 4.0–10.5)
nRBC: 0 % (ref 0.0–0.2)
nRBC: 0 % (ref 0.0–0.2)

## 2020-06-16 LAB — MAGNESIUM
Magnesium: 2.3 mg/dL (ref 1.7–2.4)
Magnesium: 2 mg/dL (ref 1.7–2.4)

## 2020-06-16 LAB — HEMOGLOBIN A1C
Hgb A1c MFr Bld: 5.7 % — ABNORMAL HIGH (ref 4.8–5.6)
Mean Plasma Glucose: 117 mg/dL

## 2020-06-16 MED ORDER — LIDOCAINE 5 % EX PTCH
2.0000 | MEDICATED_PATCH | CUTANEOUS | Status: DC
  Administered 2020-06-16: 2 via TRANSDERMAL
  Administered 2020-06-17: 1 via TRANSDERMAL
  Administered 2020-06-18: 2 via TRANSDERMAL
  Filled 2020-06-16 (×4): qty 2

## 2020-06-16 MED ORDER — ENOXAPARIN SODIUM 40 MG/0.4ML ~~LOC~~ SOLN
40.0000 mg | Freq: Every day | SUBCUTANEOUS | Status: DC
  Administered 2020-06-16 – 2020-06-18 (×3): 40 mg via SUBCUTANEOUS
  Filled 2020-06-16 (×3): qty 0.4

## 2020-06-16 MED ORDER — ACETAMINOPHEN 10 MG/ML IV SOLN
1000.0000 mg | Freq: Four times a day (QID) | INTRAVENOUS | Status: AC
  Administered 2020-06-16 – 2020-06-17 (×4): 1000 mg via INTRAVENOUS
  Filled 2020-06-16 (×4): qty 100

## 2020-06-16 MED ORDER — KETOROLAC TROMETHAMINE 30 MG/ML IJ SOLN
30.0000 mg | Freq: Four times a day (QID) | INTRAMUSCULAR | Status: AC | PRN
  Administered 2020-06-16 – 2020-06-17 (×4): 30 mg via INTRAVENOUS
  Filled 2020-06-16 (×4): qty 1

## 2020-06-16 MED ORDER — POTASSIUM CHLORIDE CRYS ER 20 MEQ PO TBCR
20.0000 meq | EXTENDED_RELEASE_TABLET | ORAL | Status: AC
  Administered 2020-06-16 – 2020-06-17 (×3): 20 meq via ORAL
  Filled 2020-06-16 (×3): qty 1

## 2020-06-16 MED ORDER — PROMETHAZINE HCL 25 MG/ML IJ SOLN
12.5000 mg | Freq: Four times a day (QID) | INTRAMUSCULAR | Status: DC | PRN
  Administered 2020-06-16 – 2020-06-19 (×3): 12.5 mg via INTRAVENOUS
  Filled 2020-06-16 (×4): qty 1

## 2020-06-16 MED ORDER — INSULIN ASPART 100 UNIT/ML ~~LOC~~ SOLN
0.0000 [IU] | SUBCUTANEOUS | Status: DC
  Administered 2020-06-16 – 2020-06-17 (×4): 2 [IU] via SUBCUTANEOUS

## 2020-06-16 MED ORDER — CHLORHEXIDINE GLUCONATE CLOTH 2 % EX PADS
6.0000 | MEDICATED_PAD | Freq: Every day | CUTANEOUS | Status: DC
  Administered 2020-06-16 – 2020-06-19 (×3): 6 via TOPICAL

## 2020-06-16 MED ORDER — FENTANYL CITRATE (PF) 100 MCG/2ML IJ SOLN
50.0000 ug | INTRAMUSCULAR | Status: DC | PRN
  Administered 2020-06-16 – 2020-06-18 (×4): 50 ug via INTRAVENOUS
  Filled 2020-06-16 (×4): qty 2

## 2020-06-16 MED FILL — Sodium Bicarbonate IV Soln 8.4%: INTRAVENOUS | Qty: 50 | Status: AC

## 2020-06-16 MED FILL — Electrolyte-R (PH 7.4) Solution: INTRAVENOUS | Qty: 4000 | Status: AC

## 2020-06-16 MED FILL — Mannitol IV Soln 20%: INTRAVENOUS | Qty: 500 | Status: AC

## 2020-06-16 MED FILL — Calcium Chloride Inj 10%: INTRAVENOUS | Qty: 10 | Status: AC

## 2020-06-16 MED FILL — Heparin Sodium (Porcine) Inj 1000 Unit/ML: INTRAMUSCULAR | Qty: 10 | Status: AC

## 2020-06-16 MED FILL — Sodium Chloride IV Soln 0.9%: INTRAVENOUS | Qty: 2000 | Status: AC

## 2020-06-16 NOTE — Progress Notes (Signed)
      [address]       [place] [address]             [phone]                 1 Day Post-Op Procedure(s) (LRB): CORONARY ARTERY BYPASS GRAFTING (CABG) TIMES THREE USING HARVESTED LEFT RADIAL ARTERY, LEFT INTERNAL MAMMARY ARTERY,RIGHT GREATER SAPHENOUS VEIN,HARVESTED ENDOSCOPICALLY. Removal of balloon pump. (N/A) TRANSESOPHAGEAL ECHOCARDIOGRAM (TEE) (N/A)   Events: Pain overnight _______________________________________________________________ Vitals: BP 108/61   Pulse 82   Temp 98.6 F (37 C)   Resp (!) 22   Ht 5\' 9"  (1.753 m)   Wt 80.8 kg   SpO2 100%   BMI 26.31 kg/m   - Neuro: alert NAD  - Cardiovascular: sinus  Drips: cardene.   PAP: (15-37)/(5-20) 20/7 CO:  [3.4 L/min-7.4 L/min] 5.7 L/min CI:  [1.7 L/min/m2-3.8 L/min/m2] 2.9 L/min/m2  - Pulm: EWOB  ABG    Component Value Date/Time   PHART 7.305 (L) 06/16/2020 0138   PCO2ART 48.6 (H) 06/16/2020 0138   PO2ART 107 06/16/2020 0138   HCO3 24.1 06/16/2020 0138   TCO2 26 06/16/2020 0138   ACIDBASEDEF 2.0 06/16/2020 0138   O2SAT 97.0 06/16/2020 0138    - Abd: abd and groin soft - Extremity: warm  .Intake/Output      10/19 0701 - 10/20 0700 10/20 0701 - 10/21 0700   I.V. (mL/kg) 2343.4 (29) 69.7 (0.9)   Blood 250    IV Piggyback 487.9    Total Intake(mL/kg) 3081.3 (38.1) 69.7 (0.9)   Urine (mL/kg/hr) 2875 300 (1.1)   Emesis/NG output 0    Blood 550    Chest Tube 630    Total Output 4055 300   Net -973.7 -230.3        Emesis Occurrence 1 x       _______________________________________________________________ Labs: CBC Latest Ref Rng & Units 06/16/2020 06/16/2020 06/16/2020  WBC 4.0 - 10.5 K/uL 12.7(H) - -  Hemoglobin 13.0 - 17.0 g/dL 11.2(L) 10.2(L) 10.5(L)  Hematocrit 39 - 52 % 33.0(L) 30.0(L) 31.0(L)  Platelets 150 - 400 K/uL 186 - -   CMP Latest Ref Rng & Units 06/16/2020 06/16/2020 06/16/2020  Glucose 70 - 99 mg/dL 06/18/2020) - -  BUN 6 - 20 mg/dL 11 - -  Creatinine 350(K  - 1.24 mg/dL 9.38 - -  Sodium 1.82 - 145 mmol/L 138 140 140  Potassium 3.5 - 5.1 mmol/L 3.9 3.8 4.0  Chloride 98 - 111 mmol/L 108 - -  CO2 22 - 32 mmol/L 22 - -  Calcium 8.9 - 10.3 mg/dL 7.8(L) - -  Total Protein 6.5 - 8.1 g/dL - - -  Total Bilirubin 0.3 - 1.2 mg/dL - - -  Alkaline Phos 38 - 126 U/L - - -  AST 15 - 41 U/L - - -  ALT 0 - 44 U/L - - -    CXR: Clear  _______________________________________________________________  Assessment and Plan: POD 1 s/p CABG   Neuro: improving pain control today CV: on norvasc for radial artery.  Will transition off cardene gtt Pulm: pulm toilet.  Will keep drains Renal: stable.  Diuresing well GI: advancing diet Heme: stable ID: afebrile Endo: SSI Dispo: continue ICU care  993, MD 06/16/2020 10:19 AM

## 2020-06-16 NOTE — Progress Notes (Addendum)
   Awake, alert, and following commands this morning after three-vessel coronary bypass with LIMA to LAD, free radial to ramus, and SVG to PDA.  Neurologically intact.  ECG this a.m. reveals no ischemic change.  No arrhythmia.

## 2020-06-16 NOTE — Procedures (Signed)
Extubation Procedure Note  Patient Details:   Name: Michael Franklin DOB: 1976/08/24 MRN: [id number]   Airway Documentation:  Airway 8 mm (Active)  Secured at (cm) 24 cm 06/15/20 2340  Measured From Lips 06/15/20 2340  Secured Location Right 06/15/20 2340  Secured By [employer] Tape 06/15/20 2340  Cuff Pressure (cm H2O) 30 cm H2O 06/15/20 2340  Site Condition Dry 06/15/20 2340   Vent end date: (not recorded) Vent end time: (not recorded)   Evaluation  O2 sats: stable throughout Complications: No apparent complications Patient did tolerate procedure well. Bilateral Breath Sounds: Clear, Diminished   Yes  Pt. Extubated to 5L [place], clear bilateral BS, no stridor noted. Pt. Able to say name. Pt. Did have a audible cuff leak. VC 1L, nif -30.Marland Kitchen ABG within normal limits, RT will continue to monitor.  Michael Franklin 06/16/2020, 12:40 AM

## 2020-06-16 NOTE — Progress Notes (Signed)
[facility] BRIEF SICU PROGRESS NOTE  1 Day Post-Op  S/P Procedure(s) (LRB): CORONARY ARTERY BYPASS GRAFTING (CABG) TIMES THREE USING HARVESTED LEFT RADIAL ARTERY, LEFT INTERNAL MAMMARY ARTERY,RIGHT GREATER SAPHENOUS VEIN,HARVESTED ENDOSCOPICALLY. Removal of balloon pump. (N/A) TRANSESOPHAGEAL ECHOCARDIOGRAM (TEE) (N/A)   Stable day NSR w/ stable BP Breathing comfortably on room air  Plan: Continue current plan  Purcell Nails, MD 06/16/2020 5:57 PM

## 2020-06-16 NOTE — Progress Notes (Signed)
Verbal order to remove femostop. Right femoral femostop removed per protocol. Site is a level 0. Pulses palpable (+2). Patient hemodynamically stable.

## 2020-06-17 ENCOUNTER — Ambulatory Visit: Payer: 59 | Admitting: Cardiovascular Disease

## 2020-06-17 ENCOUNTER — Inpatient Hospital Stay (HOSPITAL_COMMUNITY): Payer: 59

## 2020-06-17 ENCOUNTER — Other Ambulatory Visit: Payer: Self-pay | Admitting: *Deleted

## 2020-06-17 DIAGNOSIS — Z951 Presence of aortocoronary bypass graft: Secondary | ICD-10-CM

## 2020-06-17 DIAGNOSIS — I214 Non-ST elevation (NSTEMI) myocardial infarction: Secondary | ICD-10-CM

## 2020-06-17 LAB — CBC
HCT: 29.4 % — ABNORMAL LOW (ref 39.0–52.0)
Hemoglobin: 9.6 g/dL — ABNORMAL LOW (ref 13.0–17.0)
MCH: 29.4 pg (ref 26.0–34.0)
MCHC: 32.7 g/dL (ref 30.0–36.0)
MCV: 90.2 fL (ref 80.0–100.0)
Platelets: 133 10*3/uL — ABNORMAL LOW (ref 150–400)
RBC: 3.26 MIL/uL — ABNORMAL LOW (ref 4.22–5.81)
RDW: 13 % (ref 11.5–15.5)
WBC: 10.4 10*3/uL (ref 4.0–10.5)
nRBC: 0 % (ref 0.0–0.2)

## 2020-06-17 LAB — GLUCOSE, CAPILLARY
Glucose-Capillary: 103 mg/dL — ABNORMAL HIGH (ref 70–99)
Glucose-Capillary: 108 mg/dL — ABNORMAL HIGH (ref 70–99)
Glucose-Capillary: 109 mg/dL — ABNORMAL HIGH (ref 70–99)
Glucose-Capillary: 114 mg/dL — ABNORMAL HIGH (ref 70–99)
Glucose-Capillary: 136 mg/dL — ABNORMAL HIGH (ref 70–99)
Glucose-Capillary: 95 mg/dL (ref 70–99)

## 2020-06-17 LAB — BASIC METABOLIC PANEL
Anion gap: 7 (ref 5–15)
BUN: 9 mg/dL (ref 6–20)
CO2: 27 mmol/L (ref 22–32)
Calcium: 8.3 mg/dL — ABNORMAL LOW (ref 8.9–10.3)
Chloride: 102 mmol/L (ref 98–111)
Creatinine, Ser: 1.06 mg/dL (ref 0.61–1.24)
GFR, Estimated: >60 mL/min (ref >60)
Glucose, Bld: 100 mg/dL — ABNORMAL HIGH (ref 70–99)
Potassium: 3.9 mmol/L (ref 3.5–5.1)
Sodium: 136 mmol/L (ref 135–145)

## 2020-06-17 MED ORDER — DIPHENHYDRAMINE HCL 25 MG PO CAPS: 25.0000 mg | ORAL_CAPSULE | Freq: Three times a day (TID) | ORAL | Status: DC | PRN

## 2020-06-17 MED ORDER — SODIUM CHLORIDE 0.9% FLUSH
3.0000 mL | Freq: Two times a day (BID) | INTRAVENOUS | Status: DC
  Administered 2020-06-17 – 2020-06-19 (×4): 3 mL via INTRAVENOUS

## 2020-06-17 MED ORDER — KETOROLAC TROMETHAMINE 30 MG/ML IJ SOLN
30.0000 mg | Freq: Four times a day (QID) | INTRAMUSCULAR | Status: DC
  Administered 2020-06-17 – 2020-06-18 (×7): 30 mg via INTRAVENOUS
  Filled 2020-06-17 (×8): qty 1

## 2020-06-17 MED ORDER — DIPHENHYDRAMINE HCL 25 MG PO CAPS
25.0000 mg | ORAL_CAPSULE | Freq: Four times a day (QID) | ORAL | Status: DC | PRN
  Administered 2020-06-17: 25 mg via ORAL
  Filled 2020-06-17: qty 1

## 2020-06-17 MED ORDER — SODIUM CHLORIDE 0.9% FLUSH: 10.0000 mL | INTRAVENOUS | Status: DC | PRN

## 2020-06-17 MED ORDER — SODIUM CHLORIDE 0.9 % IV SOLN: 250.0000 mL | INTRAVENOUS | Status: DC | PRN

## 2020-06-17 MED ORDER — HYDROXYZINE HCL 10 MG PO TABS
10.0000 mg | ORAL_TABLET | Freq: Three times a day (TID) | ORAL | Status: DC | PRN
  Filled 2020-06-17: qty 1

## 2020-06-17 MED ORDER — METHOCARBAMOL 500 MG PO TABS
500.0000 mg | ORAL_TABLET | Freq: Three times a day (TID) | ORAL | Status: DC
  Administered 2020-06-17 – 2020-06-18 (×3): 500 mg via ORAL
  Filled 2020-06-17 (×5): qty 1

## 2020-06-17 MED ORDER — INSULIN ASPART 100 UNIT/ML ~~LOC~~ SOLN: 0.0000 [IU] | Freq: Three times a day (TID) | SUBCUTANEOUS | Status: DC

## 2020-06-17 MED ORDER — SODIUM CHLORIDE 0.9% FLUSH
10.0000 mL | Freq: Two times a day (BID) | INTRAVENOUS | Status: DC
  Administered 2020-06-17 – 2020-06-19 (×5): 10 mL

## 2020-06-17 MED ORDER — POTASSIUM CHLORIDE CRYS ER 20 MEQ PO TBCR
40.0000 meq | EXTENDED_RELEASE_TABLET | Freq: Every day | ORAL | Status: DC
  Administered 2020-06-17 – 2020-06-18 (×2): 40 meq via ORAL
  Filled 2020-06-17 (×3): qty 2

## 2020-06-17 MED ORDER — DIPHENHYDRAMINE HCL 25 MG PO CAPS
25.0000 mg | ORAL_CAPSULE | Freq: Three times a day (TID) | ORAL | Status: DC | PRN
  Administered 2020-06-17: 25 mg via ORAL
  Filled 2020-06-17: qty 1

## 2020-06-17 MED ORDER — SODIUM CHLORIDE 0.9% FLUSH: 3.0000 mL | INTRAVENOUS | Status: DC | PRN

## 2020-06-17 MED ORDER — IPRATROPIUM-ALBUTEROL 0.5-2.5 (3) MG/3ML IN SOLN: 3.0000 mL | RESPIRATORY_TRACT | Status: DC | PRN

## 2020-06-17 MED ORDER — FUROSEMIDE 40 MG PO TABS
40.0000 mg | ORAL_TABLET | Freq: Every day | ORAL | Status: DC
  Administered 2020-06-17 – 2020-06-18 (×2): 40 mg via ORAL
  Filled 2020-06-17 (×3): qty 1

## 2020-06-17 MED ORDER — ~~LOC~~ CARDIAC SURGERY, PATIENT & FAMILY EDUCATION: Freq: Once | Status: AC

## 2020-06-17 NOTE — Consult Note (Signed)
   [facility] CM Inpatient Consult   06/17/2020  Michael Franklin 14-May-1976 [id number]    [facility] [[facility]]  Accountable Care Organization [ACO] Patient: [name]  Patient is to be followed by a [facility] Care Coordinator for the [facility] plan for support and needs.  Patient will receive a post hospital call and will be evaluated for assessments and disease process education.     For additional questions or referrals please contact:   Michael Shanks, RN BSN CCM [facility]  [phone] business mobile phone Toll free office [phone]  Fax number: [phone] [email or web address]

## 2020-06-17 NOTE — Progress Notes (Signed)
Spoke with Dr. Cornelius Moras and made him aware that patient is complaining of itching to incision sites and anywhere that has a dressing and asking for benadryl.  MD gave order for benadryl 25 mg PO q6H PRN itching.

## 2020-06-17 NOTE — Progress Notes (Signed)
Patient dangled on bedside and then stood to weigh.  Stood at door with four wheel walker and when getting ready to leave room to walk in hall patient stated he was dizzy and nauseated and had to lay back in bed. Zofran given and patient complained of cramping in right flank.  Hot packs applied to right flank per patient's request. BP stable with NSR on monitor. On 2L o2.

## 2020-06-17 NOTE — Progress Notes (Signed)
      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 Removal of balloon pump. (N/A) TRANSESOPHAGEAL ECHOCARDIOGRAM (TEE) (N/A)   Events: Pain overnight _______________________________________________________________ Vitals: BP (!) 148/77 (BP Location: Right Arm)   Pulse 75   Temp 99.1 F (37.3 C) (Oral)   Resp (!) 24   Ht 5\' 9"  (1.753 m)   Wt 80.5 kg   SpO2 92%   BMI 26.21 kg/m   - Neuro: alert NAD  - Cardiovascular: sinus  Drips: none    - Pulm: EWOB  ABG    Component Value Date/Time   PHART 7.305 (L) 06/16/2020 0138   PCO2ART 48.6 (H) 06/16/2020 0138   PO2ART 107 06/16/2020 0138   HCO3 24.1 06/16/2020 0138   TCO2 26 06/16/2020 0138   ACIDBASEDEF 2.0 06/16/2020 0138   O2SAT 97.0 06/16/2020 0138    - Abd: abd and groin soft - Extremity: warm  .Intake/Output      10/21 0701 - 10/22 0700   P.O.    I.V. (mL/kg) 0 (0)   IV Piggyback    Total Intake(mL/kg) 0 (0)   Urine (mL/kg/hr) 2725 (2.7)   Chest Tube 50   Total Output 2775   Net -2775       Urine Occurrence 1 x      _______________________________________________________________ Labs: CBC Latest Ref Rng & Units 06/17/2020 06/16/2020 06/16/2020  WBC 4.0 - 10.5 K/uL 10.4 12.6(H) 12.7(H)  Hemoglobin 13.0 - 17.0 g/dL 06/18/2020) 10.6(L) 11.2(L)  Hematocrit 39 - 52 % 29.4(L) 32.5(L) 33.0(L)  Platelets 150 - 400 K/uL 133(L) 163 186   CMP Latest Ref Rng & Units 06/17/2020 06/16/2020 06/16/2020  Glucose 70 - 99 mg/dL 06/18/2020) 683(M) 196(Q)  BUN 6 - 20 mg/dL 9 9 11   Creatinine 0.61 - 1.24 mg/dL 229(N 9.89  Sodium 135 - 145 mmol/L 136 135 138  Potassium 3.5 - 5.1 mmol/L 3.9 3.5 3.9  Chloride 98 - 111 mmol/L 102 102 108  CO2 22 - 32 mmol/L 27 26 22     Calcium 8.9 - 10.3 mg/dL 8.3(L) 8.3(L) 7.8(L)  Total Protein 6.5 - 8.1 g/dL - - -  Total Bilirubin 0.3 - 1.2 mg/dL - - -  Alkaline Phos 38 - 126 U/L - - -  AST 15 - 41 U/L - - -  ALT 0 - 44 U/L - - -    CXR: Clear  _______________________________________________________________  Assessment and Plan: POD 2 s/p CABG   Neuro: still complains of pain CV: on norvasc for radial artery.   Pulm: pulm toilet.  Will keep drains Renal: stable.  Diuresing well GI: on diet Heme: stable ID: [email or web address] Endo: SSI Dispo: 2C today  2.11, MD 06/17/2020 7:22 PM

## 2020-06-17 NOTE — Plan of Care (Signed)
°  Problem: Education: Goal: Knowledge of General Education information will improve Description: Including pain rating scale, medication(s)/side effects and non-pharmacologic comfort measures Outcome: Progressing   Problem: Clinical Measurements: Goal: Diagnostic test results will improve Outcome: Progressing Goal: Respiratory complications will improve Outcome: Progressing Goal: Cardiovascular complication will be avoided Outcome: Progressing   Problem: Elimination: Goal: Will not experience complications related to urinary retention Outcome: Progressing   Problem: Pain Managment: Goal: General experience of comfort will improve Outcome: Progressing   Problem: Nutrition: Goal: Adequate nutrition will be maintained Outcome: Not Progressing Note: Pt nauseous and not wanting to eat despite antiemetics

## 2020-06-17 NOTE — Progress Notes (Signed)
CARDIAC REHAB PHASE I   PRE:  Rate/Rhythm: 71 SR  BP:  Sitting: 120/79      SaO2: 100 2L  MODE:  Ambulation: 190 ft   POST:  Rate/Rhythm: 80 SR  BP:  Sitting: 130/79    SaO2: 100 2L   Pt needing some encouragement, than ambulated 168ft in hallway standby assist with EVA. Pt c/o dizziness throughout the walk, pressures stable. Pt returned to recliner. Encouraged continued walks, IS use, and staying out of bed. Call bell and phone within reach. Will continue to follow.  [id number] Reynold Bowen, RN BSN 06/17/2020 11:46 AM

## 2020-06-17 NOTE — Progress Notes (Signed)
   Difficulty with pain control during the night.  No sleep.  Normal sinus rhythm.  No episodes of AF.  No clinical features to suggest heart failure.  Hopefully LV function will perk up with revascularization.

## 2020-06-17 NOTE — Plan of Care (Signed)
  Problem: Education: Goal: Knowledge of General Education information will improve Description: Including pain rating scale, medication(s)/side effects and non-pharmacologic comfort measures Outcome: Progressing   Problem: Health Behavior/Discharge Planning: Goal: Ability to manage health-related needs will improve Outcome: Progressing   Problem: Clinical Measurements: Goal: Ability to maintain clinical measurements within normal limits will improve Outcome: Progressing   Problem: Clinical Measurements: Goal: Cardiovascular complication will be avoided Outcome: Progressing   Problem: Activity: Goal: Risk for activity intolerance will decrease Outcome: Progressing   Problem: Nutrition: Goal: Adequate nutrition will be maintained Outcome: Progressing   Problem: Coping: Goal: Level of anxiety will decrease Outcome: Progressing   Problem: Pain Managment: Goal: General experience of comfort will improve Outcome: Progressing   Problem: Skin Integrity: Goal: Risk for impaired skin integrity will decrease Outcome: Progressing

## 2020-06-18 ENCOUNTER — Ambulatory Visit: Payer: 59 | Admitting: Cardiovascular Disease

## 2020-06-18 ENCOUNTER — Inpatient Hospital Stay (HOSPITAL_COMMUNITY): Payer: 59

## 2020-06-18 LAB — BASIC METABOLIC PANEL
Anion gap: 10 (ref 5–15)
BUN: 11 mg/dL (ref 6–20)
CO2: 25 mmol/L (ref 22–32)
Calcium: 8.1 mg/dL — ABNORMAL LOW (ref 8.9–10.3)
Chloride: 103 mmol/L (ref 98–111)
Creatinine, Ser: 1.14 mg/dL (ref 0.61–1.24)
GFR, Estimated: >60 mL/min (ref >60)
Glucose, Bld: 98 mg/dL (ref 70–99)
Potassium: 3.9 mmol/L (ref 3.5–5.1)
Sodium: 138 mmol/L (ref 135–145)

## 2020-06-18 LAB — CBC
HCT: 28.5 % — ABNORMAL LOW (ref 39.0–52.0)
Hemoglobin: 9.2 g/dL — ABNORMAL LOW (ref 13.0–17.0)
MCH: 29 pg (ref 26.0–34.0)
MCHC: 32.3 g/dL (ref 30.0–36.0)
MCV: 89.9 fL (ref 80.0–100.0)
Platelets: 136 10*3/uL — ABNORMAL LOW (ref 150–400)
RBC: 3.17 MIL/uL — ABNORMAL LOW (ref 4.22–5.81)
RDW: 12.8 % (ref 11.5–15.5)
WBC: 9.8 10*3/uL (ref 4.0–10.5)
nRBC: 0 % (ref 0.0–0.2)

## 2020-06-18 LAB — GLUCOSE, CAPILLARY: Glucose-Capillary: 82 mg/dL (ref 70–99)

## 2020-06-18 MED ORDER — HYDROMORPHONE HCL 1 MG/ML PO LIQD
1.0000 mg | ORAL | Status: DC | PRN
  Administered 2020-06-18 (×2): 1 mg via ORAL
  Filled 2020-06-18 (×2): qty 1

## 2020-06-18 MED ORDER — GABAPENTIN 100 MG PO CAPS
100.0000 mg | ORAL_CAPSULE | Freq: Three times a day (TID) | ORAL | Status: DC
  Administered 2020-06-18 – 2020-06-19 (×2): 100 mg via ORAL
  Filled 2020-06-18 (×2): qty 1

## 2020-06-18 NOTE — Discharge Summary (Signed)
Physician Discharge Summary       [address] [place]       [place] [address]             [phone]    Patient ID: Michael Franklin MRN: [id number] DOB/AGE: 46-30-1977 46 y.o.  Admit date: 06/15/2020 Discharge date: 06/19/2020  Admission Diagnoses: 1. S/p NSTEMI 2. Coronary artery disease  Discharge Diagnoses:  1. S/p CABG x 3 2. Expected blood loss anemia 3. History of hyperlipidemia  Consults: None  Procedure (s):  CABG X 3, LIMA LAD, left radial artery to ramus intermedius, reverse saphenous vein graft to PDA Open left radial artery harvest Endoscopic greater saphenous vein harvest on the left Intra-operative Transesophageal Echocardiogram by Dr. Cliffton Asters on 06/15/2020.  History of Presenting Illness: The patient is a 44 year old male with no previous documented history of cardiac disease.  He does have a known family history with father having CABG at age 29.  He was recently diagnosed approximately 6 months ago with hyperlipidemia.  He was prescribed atorvastatin but this was discontinued due to severe myalgia symptoms.  The patient is quite physically active and plays soccer frequently.  During a match on Thursday of last week he developed an episode of prolonged substernal chest tightness that lasted for approximately 1 hour and then subsided.  He continued to have some symptoms with exertion and attempting to play another game the pain worsened he was unable to continue.  On 06/13/2020 he had an episode at work while going up the stairs prompting further evaluation.  An EKG was done at rest and was unremarkable.  A repeat EKG with some activity showed a 2 mm ST depression in the inferior leads with a 1 to 2 mm ST elevation in lead aVR.  The patient was admitted for further evaluation and treatment to include cardiology consultation. Troponins (HS)peaked at 333 . LVEF was estimated at 50 to 55% and the full report is listed below.  Cardiac catheterization shows  severe three-vessel coronary artery disease with occluded ostial LAD with some collaterals from the RCA, 95% stenosis in a large ostial ramus and severe right PDA disease.  EF was 50% with apical hypokinesis and mildly elevated LVEDP at 24 mmHg.  Due to these findings an intra-aortic balloon pump has been placed this morning and he was transferred from [facility] for surgical coronary artery revascularization by Dr. Cliffton Asters. Dr. Cliffton Asters discussed potential risks, benefits, and complications of the surgery. Patient agreed to proceed. Pre operative carotid duplex US showed no significant internal carotid artery stenosis bilaterally. He underwent a CABG x 3 with left radial artery harvest on 06/15/2020.  Brief Hospital Course:  Patient was extubated the morning of post operative day one. IABP was removed. He remained afebrile and hemodynamically stable. He was weaned off Cardene drip. He was started on Amlodipine for left radial artery harvest. Lopresor was started. Theone Murdoch, a line, foley were removed early in his post operative course. Chest tubes remained for a few days and once output decreased, were removed on 10/22. He was volume overloaded and diuresed. He had expected blood loss anemia. He did not require a transfusion. Last H and H was 9.2 and 28.5. He was weaned off the Insulin drip. His pre op HGA1C is 5.7. He will need further surveillance as an outpatient. He was felt surgically stable for transfer from the ICU to [facility] on 10/21. He is ambulating on room air. Pain control has been an issue and he is not  eating much (no abdominal pain, no nausea). Motor/sensory LUE is intact. Sternal and LUE wounds are clean, dry, and healing without sign of infection. Per patient request, Oxy PRN, Neurontin, Phenergan oral PRN, and Trazadone for sleep. He is felt surgically stable for discharge.  Latest Vital Signs: Blood pressure (!) 140/92, pulse 82, temperature 98.2 F (36.8 C), temperature source Oral,  resp. rate 18, height 5\' 9"  (1.753 m), weight 75.1 kg, SpO2 95 %.  Physical Exam: Cardiovascular: RRR Pulmonary: Slightly diminished right base Abdomen: Soft, non tender, bowel sounds present. Extremities: No LE edema.  Motor/sensory intact LUE Wounds: Clean and dry.  No erythema or signs of infection. LUE wound dressing removed and wound is clean and dry. Mild ecchymosis.   Discharge Condition: Stable and discharged to home.  Recent laboratory studies:  Lab Results  Component Value Date   WBC 9.8 06/18/2020   HGB 9.2 (L) 06/18/2020   HCT 28.5 (L) 06/18/2020   MCV 89.9 06/18/2020   PLT 136 (L) 06/18/2020   Lab Results  Component Value Date   NA 138 06/18/2020   K 3.9 06/18/2020   CL 103 06/18/2020   CO2 25 06/18/2020   CREATININE 1.14 06/18/2020   GLUCOSE 98 06/18/2020      Diagnostic Studies: DG Chest 2 View  Result Date: 06/15/2020 CLINICAL DATA:  Chest discomfort. EXAM: CHEST - 2 VIEW COMPARISON:  06/15/2015. FINDINGS: Mediastinum hilar structures normal. Lungs are clear. No pleural effusion or pneumothorax. Heart size normal. No acute bony abnormality. Mild degenerative change thoracic spine. IMPRESSION: No acute cardiopulmonary disease. Electronically Signed   By: Maisie Fus  Register   On: 06/15/2020 05:44   CARDIAC CATHETERIZATION  Result Date: 06/15/2020 Successful intra-aortic balloon pump placement via the right common femoral artery.  The patient had a vagal response with sheath placement with bradycardia and continued to have intermittent sinus bradycardia. Recommendations: Continue heparin drip.  Transfer to [facility] for urgent CABG.  CARDIAC CATHETERIZATION  Result Date: 06/14/2020  Ost LAD to Prox LAD lesion is 100% stenosed.  Ramus lesion is 95% stenosed.  Ost Cx to Prox Cx lesion is 30% stenosed.  Mid Cx lesion is 30% stenosed.  Prox RCA to Mid RCA lesion is 30% stenosed.  RPDA-1 lesion is 90% stenosed.  RPDA-2 lesion is 80% stenosed.  There is mild left  ventricular systolic dysfunction.  LV end diastolic pressure is moderately elevated.  The left ventricular ejection fraction is 45-50% by visual estimate.  1.  Severe three-vessel coronary artery disease.  Occluded ostial LAD with reasonable collaterals noted from RCA, 95% stenosis in large ostial ramus and severe right PDA disease. 2.  Mildly reduced LV systolic function with an EF of 50% with mild distal anterior and apical hypokinesis. 3.  Moderately elevated left ventricular end-diastolic pressure at 24 mmHg. Recommendations: I suspect that the patient occluded his LAD last week on Thursday when he had prolonged chest pain.  It is possible that he had a pre-existing stenosis leading to preformed collaterals before the occlusion.  In spite of occluded LAD, ejection fraction is almost well-preserved.  Given ostial location of the LAD occlusion and ostial disease in the ramus, PCI is not suitable.  Recommend evaluation for CABG. Resume heparin 8 hours after sheath pull. No beta-blocker for now given bradycardia. The patient appears to be stable and we can plan to transfer the patient to [facility] tomorrow a.m.   DG CHEST PORT 1 VIEW  Result Date: 06/18/2020 CLINICAL DATA:  Pleural effusion EXAM:  PORTABLE CHEST 1 VIEW COMPARISON:  06/17/2020 FINDINGS: Right IJ sheath has been removed. Drains remain present. Similar small right pleural effusion. Stable right basilar atelectasis/consolidation. No definite pneumothorax. Stable cardiomegaly. IMPRESSION: Similar small right pleural effusion and right basilar atelectasis/consolidation. Electronically Signed   By: Guadlupe Spanish M.D.   On: 06/18/2020 08:20   DG Chest Port 1 View  Result Date: 06/17/2020 CLINICAL DATA:  Pleural effusion. EXAM: PORTABLE CHEST 1 VIEW COMPARISON:  06/16/2020 FINDINGS: Interval removal of the Swan-Ganz catheter with the right IJ sheath still in place. Mediastinal drains remain in place. Low lung volumes with right basilar  opacity. Mild blunting of the left costophrenic sulcus, which could represent a tiny pleural effusion. No discernible pneumothorax. Prior CABG with median sternotomy. Enlarged cardiac silhouette, similar to prior. No acute osseous abnormality. IMPRESSION: 1. Interval removal of Swan-Ganz catheter with right IJ sheath still in place. Mediastinal drains in similar position. 2. Prior CABG with cardiomegaly and low lung volumes. Possible trace left pleural effusion. 3. Right basilar opacity, favor atelectasis versus layering pleural fluid. Electronically Signed   By: Feliberto Harts MD   On: 06/17/2020 08:00   DG Chest Port 1 View  Result Date: 06/16/2020 CLINICAL DATA:  CABG. EXAM: PORTABLE CHEST 1 VIEW COMPARISON:  06/15/2020. FINDINGS: Interim extubation and removal of NG tube. Swan-Ganz catheter and mediastinal drainage catheters in stable position. Prior CABG. Cardiomegaly. No pulmonary venous congestion. Low lung volumes with mild bibasilar atelectasis. Tiny left pleural effusion cannot be excluded. No pneumothorax. Mild left neck subcutaneous emphysema. IMPRESSION: 1. Interim extubation and removal of NG tube. Swan-Ganz catheter and mediastinal drainage catheters in stable position. No pneumothorax. 2. Prior CABG. Cardiomegaly. No pulmonary venous congestion. Electronically Signed   By: Maisie Fus  Register   On: 06/16/2020 07:15   DG Chest Port 1 View  Result Date: 06/15/2020 CLINICAL DATA:  44 year old male status post open heart surgery. EXAM: PORTABLE CHEST 1 VIEW COMPARISON:  Chest radiograph dated 06/15/2020 FINDINGS: Endotracheal tube with tip approximately 5 cm above the carina. Enteric tube extending below the diaphragm with side port in the distal esophagus and tip tip just beyond the GE junction. Recommend further advancing of the tube by at least additional 8 cm. Right IJ Swan-Ganz catheter with tip likely in the right main pulmonary artery. Left-sided chest tube with tip projecting over the  left costophrenic angle. Faint left mid to lower lung field densities, likely atelectasis. No lobar consolidation, large pleural effusion, or pneumothorax. Top-normal cardiac size. Median sternotomy wires and CABG vascular clips. No acute osseous pathology. IMPRESSION: 1. Endotracheal tube above the carina. 2. Enteric tube side port in the distal esophagus and tip just beyond the GE junction. Recommend further advancing of the tube by at least 8 cm. 3. Right IJ Swan-Ganz catheter with tip likely in the right main pulmonary artery. Electronically Signed   By: Elgie Collard M.D.   On: 06/15/2020 19:14   DG CHEST PORT 1 VIEW  Result Date: 06/15/2020 CLINICAL DATA:  44 year old male with shortness of breath. EXAM: PORTABLE CHEST 1 VIEW COMPARISON:  Chest radiograph dated 06/14/2020. FINDINGS: No focal consolidation, pleural effusion, or pneumothorax. The cardiac silhouette is within limits. No acute osseous pathology. IMPRESSION: No active disease. Electronically Signed   By: Elgie Collard M.D.   On: 06/15/2020 01:39   ECHOCARDIOGRAM COMPLETE  Result Date: 06/14/2020    ECHOCARDIOGRAM REPORT   Patient Name:   Michael Franklin Date of Exam: 06/14/2020 Medical Rec #:  [id number]  Height:       69.0 in Accession #:    [id number]      Weight:       171.3 lb Date of Birth:  [birth year]       BSA:          1.934 m Patient Age:    44 years        BP:           148/94 mmHg Patient Gender: M               HR:           67 bpm. Exam Location:  [facility] Procedure: 2D Echo, Cardiac Doppler and Color Doppler                              MODIFIED REPORT: This report was modified by Lorine Bears MD on 06/14/2020 due to update.  Indications:     Chest Pain 786.50 / R07.9  History:         Patient has no prior history of Echocardiogram examinations.  Sonographer:     Neysa Bonito Roar Referring Phys:  [id number] AGBATA Diagnosing Phys: Lorine Bears MD IMPRESSIONS  1. Left ventricular ejection fraction, by estimation,  is 50 to 55%. The left ventricle has low normal function. The left ventricle demonstrates regional wall motion abnormalities (see scoring diagram/findings for description). The left ventricular internal cavity size was mildly dilated. Left ventricular diastolic parameters were normal. There is mild hypokinesis of the left ventricular, apical anterior wall and apical segment.  2. Right ventricular systolic function is normal. The right ventricular size is normal. Tricuspid regurgitation signal is inadequate for assessing PA pressure.  3. The mitral valve is normal in structure. Mild mitral valve regurgitation. No evidence of mitral stenosis.  4. The aortic valve is normal in structure. Aortic valve regurgitation is not visualized. No aortic stenosis is present. FINDINGS  Left Ventricle: Left ventricular ejection fraction, by estimation, is 50 to 55%. The left ventricle has low normal function. The left ventricle demonstrates regional wall motion abnormalities. Mild hypokinesis of the left ventricular, apical anterior wall and apical segment. The left ventricular internal cavity size was mildly dilated. There is no left ventricular hypertrophy. Left ventricular diastolic parameters were normal. Right Ventricle: The right ventricular size is normal. No increase in right ventricular wall thickness. Right ventricular systolic function is normal. Tricuspid regurgitation signal is inadequate for assessing PA pressure. Left Atrium: Left atrial size was normal in size. Right Atrium: Right atrial size was normal in size. Pericardium: There is no evidence of pericardial effusion. Mitral Valve: The mitral valve is normal in structure. Mild mitral valve regurgitation. No evidence of mitral valve stenosis. Tricuspid Valve: The tricuspid valve is normal in structure. Tricuspid valve regurgitation is not demonstrated. No evidence of tricuspid stenosis. Aortic Valve: The aortic valve is normal in structure. Aortic valve  regurgitation is not visualized. No aortic stenosis is present. Aortic valve peak gradient measures 8.0 mmHg. Pulmonic Valve: The pulmonic valve was normal in structure. Pulmonic valve regurgitation is trivial. No evidence of pulmonic stenosis. Aorta: The aortic root is normal in size and structure. Venous: The inferior vena cava was not well visualized. IAS/Shunts: No atrial level shunt detected by color flow Doppler.  LEFT VENTRICLE PLAX 2D LVIDd:         5.58 cm  Diastology LVIDs:         4.10 cm  LV e' medial:    8.59 cm/s LV PW:         0.93 cm  LV E/e' medial:  7.9 LV IVS:        1.13 cm  LV e' lateral:   12.60 cm/s LVOT diam:     2.10 cm  LV E/e' lateral: 5.4 LVOT Area:     3.46 cm  RIGHT VENTRICLE RV Mid diam:    3.09 cm RV S prime:     12.80 cm/s TAPSE (M-mode): 3.0 cm LEFT ATRIUM             Index       RIGHT ATRIUM           Index LA diam:        3.90 cm 2.02 cm/m  RA Area:     19.60 cm LA Vol (A2C):   80.4 ml 41.56 ml/m RA Volume:   57.70 ml  29.83 ml/m LA Vol (A4C):   65.1 ml 33.65 ml/m LA Biplane Vol: 74.5 ml 38.51 ml/m  AORTIC VALVE                PULMONIC VALVE AV Area (Vmax): 2.24 cm    PV Vmax:        1.11 m/s AV Vmax:        141.00 cm/s PV Peak grad:   4.9 mmHg AV Peak Grad:   8.0 mmHg    RVOT Peak grad: 3 mmHg LVOT Vmax:      91.30 cm/s  AORTA Ao Root diam: 2.90 cm MITRAL VALVE MV Area (PHT): 4.41 cm    SHUNTS MV Decel Time: 172 msec    Systemic Diam: 2.10 cm MV E velocity: 67.60 cm/s MV A velocity: 53.30 cm/s MV E/A ratio:  1.27 MV A Prime:    9.0 cm/s Lorine BearsMuhammad Arida MD Electronically signed by Lorine BearsMuhammad Arida MD Signature Date/Time: 06/14/2020/9:44:14 PM    Final (Updated)    ECHO INTRAOPERATIVE TEE  Result Date: 06/15/2020  *INTRAOPERATIVE TRANSESOPHAGEAL REPORT *  Patient Name:   Michael BloodgoodSEYED Franklin Date of Exam: 06/15/2020 Medical Rec #:  [id number]       Height:       69.0 in Accession #:    [id number]      Weight:       173.7 lb Date of Birth:  [birth year]       BSA:          1.95 m  Patient Age:    44 years        BP:           101/71 mmHg Patient Gender: M               HR:           58 bpm. Exam Location:  Anesthesiology Transesophogeal exam was perform intraoperatively during surgical procedure. Patient was closely monitored under general anesthesia during the entirety of examination. Indications:     CABG. CAD Performing Phys: Karna Christmasyan Ellender, MD Complications: No known complications during this procedure. POST-OP IMPRESSIONS Overall, there were no significant changes from pre-bypass. - Left Ventricle: The left ventricle is unchanged from pre-bypass. - Right Ventricle: The right ventricle appears unchanged from pre-bypass. - Aorta: The aorta appears unchanged from pre-bypass. - Left Atrium: The left atrium appears unchanged from pre-bypass. - Left Atrial Appendage: The left atrial appendage appears unchanged from pre-bypass. - Aortic Valve: The aortic valve appears unchanged from pre-bypass. - Mitral Valve: The mitral valve appears unchanged  from pre-bypass. - Tricuspid Valve: The tricuspid valve appears unchanged from pre-bypass. - Interatrial Septum: The interatrial septum appears unchanged from pre-bypass. - Interventricular Septum: The interventricular septum appears unchanged from pre-bypass. - Pericardium: The pericardium appears unchanged from pre-bypass. PRE-OP FINDINGS  Left Ventricle: The left ventricle has normal systolic function, with an ejection fraction of 60-65%. The cavity size was normal. There is no increase in left ventricular wall thickness. Right Ventricle: The right ventricle has normal systolic function. The cavity was normal. There is no increase in right ventricular wall thickness. Left Atrium: Left atrial size was normal in size. The left atrial appendage is well visualized and there is no evidence of thrombus present. Right Atrium: Right atrial size was normal in size. Interatrial Septum: No atrial level shunt detected by color flow Doppler. Pericardium: There is  no evidence of pericardial effusion. Mitral Valve: The mitral valve is normal in structure. Mitral valve regurgitation is not visualized by color flow Doppler. Tricuspid Valve: The tricuspid valve was normal in structure. Tricuspid valve regurgitation was not visualized by color flow Doppler. Aortic Valve: The aortic valve is normal in structure. Aortic valve regurgitation was not visualized by color flow Doppler. There is no evidence of aortic valve stenosis. Pulmonic Valve: The pulmonic valve was normal in structure. Pulmonic valve regurgitation is trivial by color flow Doppler. Aorta: The aortic root, ascending aorta and aortic arch are normal in size and structure.  Karna Christmas MD Electronically signed by Karna Christmas MD Signature Date/Time: 06/15/2020/5:47:09 PM    Final    VAS US DOPPLER PRE CABG  Result Date: 06/15/2020 PREOPERATIVE VASCULAR EVALUATION  Indications:      Pre-CABG. Risk Factors:     Hyperlipidemia. Limitations:      Velocities and waveforms cannot be properly evaluated due to                   intra-aortic balloon pump Comparison Study: No prior study Performing Technologist: Gertie Fey MHA, RDMS, RVT, RDCS  Examination Guidelines: A complete evaluation includes B-mode imaging, spectral Doppler, color Doppler, and power Doppler as needed of all accessible portions of each vessel. Bilateral testing is considered an integral part of a complete examination. Limited examinations for reoccurring indications may be performed as noted.  Right Carotid Findings: +----------+--------+--------+--------+--------+--------+             PSV cm/s EDV cm/s Stenosis Describe Comments  +----------+--------+--------+--------+--------+--------+  CCA Prox                                       Patent    +----------+--------+--------+--------+--------+--------+  CCA Distal                                     Patent    +----------+--------+--------+--------+--------+--------+  ICA Prox                                        Patent    +----------+--------+--------+--------+--------+--------+  ICA Distal                                     Patent    +----------+--------+--------+--------+--------+--------+  ECA  Patent    +----------+--------+--------+--------+--------+--------+ Portions of this table do not appear on this page. +----------+--------+-------+--------+------------+             PSV cm/s EDV cms Describe Arm Pressure  +----------+--------+-------+--------+------------+  Subclavian                  Patent                 +----------+--------+-------+--------+------------+ +---------+--------+--------+------+  Vertebral PSV cm/s EDV cm/s Patent  +---------+--------+--------+------+ Left Carotid Findings: +----------+--------+--------+--------+--------+--------+             PSV cm/s EDV cm/s Stenosis Describe Comments  +----------+--------+--------+--------+--------+--------+  CCA Prox                                       Patent    +----------+--------+--------+--------+--------+--------+  CCA Distal                                     Patent    +----------+--------+--------+--------+--------+--------+  ICA Prox                                       Patent    +----------+--------+--------+--------+--------+--------+  ICA Distal                                     Patent    +----------+--------+--------+--------+--------+--------+  ECA                                            Patent    +----------+--------+--------+--------+--------+--------+ +----------+--------+--------+--------+------------+  Subclavian PSV cm/s EDV cm/s Describe Arm Pressure  +----------+--------+--------+--------+------------+                               Patent                 +----------+--------+--------+--------+------------+ +---------+--------+--------+------+  Vertebral PSV cm/s EDV cm/s Patent  +---------+--------+--------+------+  ABI Findings:  +--------+------------------+-----+--------+-----------------------------------+  Right    Rt Pressure (mmHg) Index Waveform Comment                              +--------+------------------+-----+--------+-----------------------------------+  Brachial                                   Patent. Pressure not obtained due                                                to recent catheterization            +--------+------------------+-----+--------+-----------------------------------+  PTA                                        Patent                               +--------+------------------+-----+--------+-----------------------------------+  DP                                         Patent                               +--------+------------------+-----+--------+-----------------------------------+ +--------+------------------+-----+--------+-------+  Left     Lt Pressure (mmHg) Index Waveform Comment  +--------+------------------+-----+--------+-------+  Brachial 105                               Patent   +--------+------------------+-----+--------+-------+  PTA                                        Patent   +--------+------------------+-----+--------+-------+  DP                                         Patent   +--------+------------------+-----+--------+-------+  Right Doppler Findings: +-----------+--------+-----+-------+-------------------------------------------+  Site        Pressure Index Doppler Comments                                     +-----------+--------+-----+-------+-------------------------------------------+  Brachial                           Patent. Pressure not obtained due to recent                                      catheterization                              +-----------+--------+-----+-------+-------------------------------------------+  Radial                             Patent                                        +-----------+--------+-----+-------+-------------------------------------------+  Ulnar                              Patent                                       +-----------+--------+-----+-------+-------------------------------------------+  Palmar Arch                        Within normal limits                         +-----------+--------+-----+-------+-------------------------------------------+  Left Doppler Findings: +-----------+--------+-----+-------+-------------------------------------------+  Site        Pressure Index Doppler Comments                                     +-----------+--------+-----+-------+-------------------------------------------+  Brachial    105                    Patent                                       +-----------+--------+-----+-------+-------------------------------------------+  Radial                             Patent. Plaque present                       +-----------+--------+-----+-------+-------------------------------------------+  Ulnar                              Patent                                       +-----------+--------+-----+-------+-------------------------------------------+  Palmar Arch                        Signal is unaffected with radial                                                 compression, decreases 50% with ulnar                                            compression.                                 +-----------+--------+-----+-------+-------------------------------------------+  Summary: Bilateral Carotid Arteries: No obvious evidence of stenosis by color Doppler. Vertebrals:  Bilateral vertebral arteries demonstrate antegrade flow. Subclavians: Patent by color Doppler. Pedal arteries: Pedal arteries are patent at rest by color Doppler. Right Upper Extremity: Doppler waveforms remain within normal limits with right radial compression. Doppler waveforms remain within normal limits with right ulnar compression. Left Upper Extremity:  Doppler waveforms remain within normal limits with left radial compression. Doppler waveforms decrease 50% with left ulnar compression.  Electronically signed by Gretta Began MD on 06/15/2020 at 4:26:00 PM.    Final        Discharge Instructions    Amb Referral to Cardiac Rehabilitation   Complete by: As directed    To [facility]   Diagnosis: CABG   CABG X ___: 3   After initial evaluation and assessments completed: Virtual Based Care may be provided alone or in conjunction with Phase 2 Cardiac Rehab based on patient barriers.: Yes      Discharge Medications: Allergies as of 06/19/2020   No Known Allergies     Medication List    STOP taking these medications   aspirin 81 MG chewable tablet Replaced by: aspirin 325 MG EC tablet   nitroGLYCERIN 0.4 MG SL tablet Commonly known as: NITROSTAT     TAKE these medications   amLODipine 5 MG tablet Commonly known as: NORVASC Take 1 tablet (5 mg total) by mouth daily. What changed:  medication strength  how much to take Notes to patient: Next dose 10/24   aspirin 325 MG EC tablet Take 1 tablet (325 mg total) by mouth daily. Start taking on: June 20, 2020 Replaces: aspirin 81 MG chewable tablet Notes to patient: Next dose 10/24   gabapentin 100 MG capsule Commonly known as: NEURONTIN Take 1 capsule (100 mg total) by mouth 3 (three) times daily.   metoprolol tartrate 25 MG tablet Commonly known as: LOPRESSOR Take 0.5 tablets (12.5 mg total) by mouth 2 (two) times daily.   oxyCODONE 5 MG immediate release tablet Commonly known as: Oxy IR/ROXICODONE Take 1 tablet (5 mg total) by mouth every 4 (four) hours as needed for severe pain.   promethazine 25 MG tablet Commonly known as: PHENERGAN Take 1 tablet (25 mg total) by mouth every 6 (six) hours as needed for nausea or vomiting.   rosuvastatin 20 MG tablet Commonly known as: CRESTOR Take 1 tablet (20 mg total) by mouth daily.   traZODone 100 MG tablet Commonly  known as: DESYREL Take 1 tablet (100 mg total) by mouth at bedtime.      The patient has been discharged on:   1.Beta Blocker:  Yes [x   ]                              No   [   ]                              If No, reason:  2.Ace Inhibitor/ARB: Yes [   ]                                     No  [  x  ]                                     If No, reason:Consider starting as outpatient when BP allows  3.Statin:   Yes [x   ]                  No  [   ]                  If No, reason:  4.Ecasa:  Yes  [ x  ]                  No   [   ]                  If No, reason:  Follow Up Appointments:  Follow-up Information    Corliss Skains, MD. Go on 06/25/2020.   Specialty: Cardiothoracic Surgery Why: Please see discharge paperwork for follow-up appointment with surgeon.  Also obtain a chest x-ray at [facility] 1/2-hour prior to this appointment.  It is located in the same office complex on the first floor. Appointment time is at 10:30 am Contact information: [address] [place] [address] [phone]        Creig Hines, NP. Go on 07/09/2020.   Specialties: Nurse Practitioner, Cardiology, Radiology Why: Appointment time is at 8:50 am Contact information: [address] [place] [address] [phone]  SignedLelon Huh ZimmermanPA-C 06/19/2020, 12:02 PM

## 2020-06-18 NOTE — Discharge Instructions (Signed)
Discharge Instructions:  1. You may shower, please wash incisions daily with soap and water and keep dry.  If you wish to cover wounds with dressing you may do so but please keep clean and change daily.  No tub baths or swimming until incisions have completely healed.  If your incisions become red or develop any drainage please call our office at [phone]  2. No Driving until cleared by Dr. Lucilla Lame office and you are no longer using narcotic pain medications  3. Monitor your weight daily.. Please use the same scale and weigh at same time... If you gain 5-10 lbs in 48 hours with associated lower extremity swelling, please contact our office at [phone]  4. Fever of 101.5 for at least 24 hours with no source, please contact our office at [phone]  5. Activity- up as tolerated, please walk at least 3 times per day.  Avoid strenuous activity, no lifting, pushing, or pulling with your arms over 8-10 lbs for a minimum of 6 weeks  6. If any questions or concerns arise, please do not hesitate to contact our office at [phone]  [facility] office [phone]   Endoscopic Saphenous Vein Harvesting, Care After This sheet gives you information about how to care for yourself after your procedure. Your health care provider may also give you more specific instructions. If you have problems or questions, contact your health care provider. What can I expect after the procedure? After the procedure, it is common to have:  Pain.  Bruising.  Swelling.  Numbness. Follow these instructions at home: Incision care   Follow instructions from your health care provider about how to take care of your incisions. Make sure you: ? Wash your hands with soap and water before and after you change your bandages (dressings). If soap and water are not available, use hand sanitizer. ? Change your dressings as told by your health care provider. ? Leave stitches (sutures), skin glue, or adhesive strips in  place. These skin closures may need to stay in place for 2 weeks or longer. If adhesive strip edges start to loosen and curl up, you may trim the loose edges. Do not remove adhesive strips completely unless your health care provider tells you to do that.  Check your incision areas every day for signs of infection. Check for: ? More redness, swelling, or pain. ? Fluid or blood. ? Warmth. ? Pus or a bad smell. Medicines  Take over-the-counter and prescription medicines only as told by your health care provider.  Ask your health care provider if the medicine prescribed to you requires you to avoid driving or using heavy machinery. General instructions  Raise (elevate) your legs above the level of your heart while you are sitting or lying down.  Avoid crossing your legs.  Avoid sitting for long periods of time. Change positions every 30 minutes.  Do any exercises your health care providers have given you. These may include deep breathing, coughing, and walking exercises.  Do not take baths, swim, or use a hot tub until your health care provider approves. Ask your health care provider if you may take showers. You may only be allowed to take sponge baths.  Wear compression stockings as told by your health care provider. These stockings help to prevent blood clots and reduce swelling in your legs.  Keep all follow-up visits as told by your health care provider. This is important. Contact a health care provider if:  Medicine does not help your pain.  Your  pain gets worse.  You have new leg bruises or your leg bruises get bigger.  Your leg feels numb.  You have more redness, swelling, or pain around your incision.  You have fluid or blood coming from your incision.  Your incision feels warm to the touch.  You have pus or a bad smell coming from your incision.  You have a fever. Get help right away if:  Your pain is severe.  You develop pain, tenderness, warmth, redness, or  swelling in any part of your leg.  You have chest pain.  You have trouble breathing. Summary  Raise (elevate) your legs above the level of your heart while you are sitting or lying down.  Wear compression stockings as told by your health care provider.  Make sure you know which symptoms should prompt you to contact your health care provider.  Keep all follow-up visits as told by your health care provider. This information is not intended to replace advice given to you by your health care provider. Make sure you discuss any questions you have with your health care provider. Document Revised: 07/22/2018 Document Reviewed: 07/22/2018 Elsevier Patient Education  2020 [employer] Coronary Artery Bypass Grafting, Care After This sheet gives you information about how to care for yourself after your procedure. Your doctor may also give you more specific instructions. If you have problems or questions, call your doctor. What can I expect after the procedure? After the procedure, it is common to:  Feel sick to your stomach (nauseous).  Not want to eat as much as normal (lack of appetite).  Have trouble pooping (constipation).  Have weakness and tiredness (fatigue).  Feel sad (depressed) or grouchy (irritable).  Have pain or discomfort around the cuts from surgery (incisions). Follow these instructions at home: Medicines  Take over-the-counter and prescription medicines only as told by your doctor. Do not stop taking medicines or start any new medicines unless your doctor says it is okay.  If you were prescribed an antibiotic medicine, take it as told by your doctor. Do not stop taking the antibiotic even if you start to feel better. Incision care   Follow instructions from your doctor about how to take care of your cuts from surgery. Make sure you: ? Wash your hands with soap and water before and after you change your bandage (dressing). If you cannot use soap and water, use hand  sanitizer. ? Change your bandage as told by your doctor. ? Leave stitches (sutures), skin glue, or skin tape (adhesive) strips in place. They may need to stay in place for 2 weeks or longer. If tape strips get loose and curl up, you may trim the loose edges. Do not remove tape strips completely unless your doctor says it is okay.  Make sure the surgery cuts are clean, dry, and protected.  Check your cut areas every day for signs of infection. Check for: ? More redness, swelling, or pain. ? More fluid or blood. ? Warmth. ? Pus or a bad smell.  If cuts were made in your legs: ? Avoid crossing your legs. ? Avoid sitting for long periods of time. Change positions every 30 minutes. ? Raise (elevate) your legs when you are sitting. Bathing  Do not take baths, swim, or use a hot tub until your doctor says it is okay.  May shower. Pat the surgery cuts dry. Do not rub the cuts to dry.   Eating and drinking   Eat foods that are high in  fiber, such as beans, nuts, whole grains, and raw fruits and vegetables. Any meats you eat should be lean cut. Avoid canned, processed, and fried foods. This can help prevent trouble pooping. This is also a part of a heart-healthy diet.  Drink enough fluid to keep your pee (urine) pale yellow.  Do not drink alcohol until you are fully recovered. Ask your doctor when it is safe to drink alcohol. Activity  Rest and limit your activity as told by your doctor. You may be told to: ? Stop any activity right away if you have chest pain, shortness of breath, irregular heartbeats, or dizziness. Get help right away if you have any of these symptoms. ? Move around often for short periods or take short walks as told by your doctor. Slowly increase your activities. ? Avoid lifting, pushing, or pulling anything that is heavier than 10 lb (4.5 kg) for at least 6 weeks or as told by your doctor.  Do physical therapy or a cardiac rehab (cardiac rehabilitation) program as  told by your doctor. ? Physical therapy involves doing exercises to maintain movement and build strength and endurance. ? A cardiac rehab program includes:  Exercise training.  Education.  Counseling.  Do not drive until your doctor says it is okay.  Ask your doctor when you can go back to work.  Ask your doctor when you can be sexually active. General instructions  Do not drive or use heavy machinery while taking prescription pain medicine.  Do not use any products that contain nicotine or tobacco. These include cigarettes, e-cigarettes, and chewing tobacco. If you need help quitting, ask your doctor.  Take 2-3 deep breaths every few hours during the day while you get better. This helps expand your lungs and prevent problems.  If you were given a device called an incentive spirometer, use it several times a day to practice deep breathing. Support your chest with a pillow or your arms when you take deep breaths or cough.  Wear compression stockings as told by your doctor.  Weigh yourself every day. This helps to see if your body is holding (retaining) fluid that may make your heart and lungs work harder.  Keep all follow-up visits as told by your doctor. This is important. Contact a doctor if:  You have more redness, swelling, or pain around any cut.  You have more fluid or blood coming from any cut.  Any cut feels warm to the touch.  You have pus or a bad smell coming from any cut.  You have a fever.  You have swelling in your ankles or legs.  You have pain in your legs.  You gain 2 lb (0.9 kg) or more a day.  You feel sick to your stomach or you throw up (vomit).  You have watery poop (diarrhea). Get help right away if:  You have chest pain that goes to your jaw or arms.  You are short of breath.  You have a fast or irregular heartbeat.  You notice a "clicking" in your breastbone (sternum) when you move.  You have any signs of a stroke. "BE FAST" is an  easy way to remember the main warning signs: ? B - Balance. Signs are dizziness, sudden trouble walking, or loss of balance. ? E - Eyes. Signs are trouble seeing or a change in how you see. ? F - Face. Signs are sudden weakness or loss of feeling of the face, or the face or eyelid drooping on one  side. ? A - Arms. Signs are weakness or loss of feeling in an arm. This happens suddenly and usually on one side of the body. ? S - Speech. Signs are sudden trouble speaking, slurred speech, or trouble understanding what people say. ? T - Time. Time to call emergency services. Write down what time symptoms started.  You have other signs of a stroke, such as: ? A sudden, very bad headache with no known cause. ? Feeling sick to your stomach. ? Throwing up. ? Jerky movements you cannot control (seizure). These symptoms may be an emergency. Do not wait to see if the symptoms will go away. Get medical help right away. Call your local emergency services (911 in the [place] Do not drive yourself to the hospital. Summary  After the procedure, it is common to have pain or discomfort in the cuts from surgery (incisions).  Do not take baths, swim, or use a hot tub until your doctor says it is okay.  Slowly increase your activities. You may need physical therapy or cardiac rehab.  Weigh yourself every day. This helps to see if your body is holding fluid. This information is not intended to replace advice given to you by your health care provider. Make sure you discuss any questions you have with your health care provider. Document Revised: 04/23/2018 Document Reviewed: 04/23/2018 Elsevier Patient Education  2020 [employer].

## 2020-06-18 NOTE — Progress Notes (Signed)
   Asleep.  NSR on monitor. Vitals stable. O2 sat 99%.

## 2020-06-18 NOTE — Progress Notes (Signed)
CARDIAC REHAB PHASE I   PRE:  Rate/Rhythm: 79 SR  BP:  Sitting: 130/81      SaO2: 92 RA  MODE:  Ambulation: 350 ft   POST:  Rate/Rhythm: 89 SR  BP:  Sitting: 140/86    SaO2: 94 RA   Pt ambulated 327ft in hallway standby assist with front wheel walker. Pt with some pain and dizziness. BPs maintained throughout. Stressed importance of continued ambulation and IS use. Will continue to follow.  [id number] Reynold Bowen, RN BSN 06/18/2020 9:52 AM

## 2020-06-18 NOTE — Progress Notes (Addendum)
[address]       [place] [address]             [phone]        3 Days Post-Op Procedure(s) (LRB): CORONARY ARTERY BYPASS GRAFTING (CABG) TIMES THREE USING HARVESTED LEFT RADIAL ARTERY, LEFT INTERNAL MAMMARY ARTERY,RIGHT GREATER SAPHENOUS VEIN,HARVESTED ENDOSCOPICALLY. Removal of balloon pump. (N/A) TRANSESOPHAGEAL ECHOCARDIOGRAM (TEE) (N/A)  Subjective: Patient in a lot of pain last night, despite multiple drugs (Fentanyl, Toradol) and did not sleep at all. He feels the chest tube "sucking" on the left. He also had a squeezing like chest pain last night. He said he looked at EKG and no new abnormality. He is unable to take a deep breath because of pain. Also, he has not eaten much because he is in too much pain.  Objective: Vital signs in last 24 hours: Temp:  [98.7 F (37.1 C)-99.1 F (37.3 C)] 98.8 F (37.1 C) (10/22 0728) Pulse Rate:  [66-82] 76 (10/22 0728) Cardiac Rhythm: Normal sinus rhythm (10/22 0312) Resp:  [5-27] 18 (10/22 0728) BP: (110-148)/(56-83) 123/82 (10/22 0728) SpO2:  [89 %-100 %] 98 % (10/22 0728) Weight:  [77.4 kg] 77.4 kg (10/22 0621)  Pre op weight 78.8 kg Current Weight  06/18/20 77.4 kg      Intake/Output from previous day: 10/21 0701 - 10/22 0700 In: 550.7 [P.O.:480; I.V.:70.7] Out: 5335 [Urine:5225; Chest Tube:110]   Physical Exam:  Cardiovascular: RRR Pulmonary: Slightly diminished right base Abdomen: Soft, non tender, bowel sounds present. Extremities: Trace bilateral lower extremity edema. Motor/sensory intact LUE Wounds: Clean and dry.  No erythema or signs of infection. LUE wound dressing is dry and intact. Chest tube: to suction, no air leak  Lab Results: CBC: Recent Labs    06/17/20 0524 06/18/20 0022  WBC 10.4 9.8  HGB 9.6* 9.2*  HCT 29.4* 28.5*  PLT 133* 136*   BMET:  Recent Labs    06/17/20 0524 06/18/20 0022  NA 136 138  K 3.9 3.9  CL 102 103  CO2 27 25  GLUCOSE 100* 98  BUN 9 11   CREATININE 1.06 1.14  CALCIUM 8.3* 8.1*    PT/INR:  Lab Results  Component Value Date   INR 1.2 06/15/2020   INR 1.0 06/15/2020   INR 1.0 06/14/2020   ABG:  INR: Will add last result for INR, ABG once components are confirmed Will add last 4 CBG results once components are confirmed  Assessment/Plan:  1. CV - Maintaining SR with HR in the 70's. On Amlodipine 2.5 mg daily and Lopressor 12.5 mg bid. 2.  Pulmonary - On 2 liters of oxygen via [place]. Chest tube this am to suction, no air leak, and outut 110 cc last 24 hours. CXR this am shows small right pleural effusion and atelectasis. Hope to remove chest tube. Encourage incentive spirometer. 3. Volume Overload - On Lasix 40 mg daily 4. Expected blood loss anemia-H and H this am slightly decreased to 9.2 and 28.5  5. CBGs 109/136/82. Pre op HGA1C 5.7. Will stop accu checks and SS PRN. He will need further surveillance with medical doctor after discharge. 6. Supplement potassium 7.  Mild thrombocytopenia-platelets 136,000 8. Will discuss pain management with Dr. Boykin Reaper, will remove chest tube, which should help  Donielle M ZimmermanPA-C 06/18/2020,8:38 AM  Agree with above Pain is the main issue.  Adding dilaudid, and gabapentin Will remove CT  Continue lasix Home this weekend if pain is under better control.  Shalimar Mcclain Bary Leriche

## 2020-06-18 NOTE — Hospital Course (Signed)
HPI: The patient is a 44 year old male with no previous documented history of cardiac disease.  He does have a known family history with father having CABG at age 66.  He was recently diagnosed approximately 6 months ago with hyperlipidemia.  He was prescribed atorvastatin but this was discontinued due to severe myalgia symptoms.  The patient is quite physically active and plays soccer frequently.  During a match on Thursday of last week he developed an episode of prolonged substernal chest tightness that lasted for approximately 1 hour and then subsided.  He continued to have some symptoms with exertion and attempting to play another game the pain worsened he was unable to continue.  On 06/13/2020 he had an episode at work while going up the stairs prompting further evaluation.  An EKG was done at rest and was unremarkable.  A repeat EKG with some activity showed a 2 mm ST depression in the inferior leads with a 1 to 2 mm ST elevation in lead aVR.  The patient was admitted for further evaluation and treatment to include cardiology consultation. Troponins (HS)peaked at 333 . LVEF was estimated at 50 to 55% and the full report is listed below.  Cardiac catheterization shows severe three-vessel coronary artery disease with occluded ostial LAD with some collaterals from the RCA, 95% stenosis in a large ostial ramus and severe right PDA disease.  EF was 50% with apical hypokinesis and mildly elevated LVEDP at 24 mmHg.  Due to these findings an intra-aortic balloon pump has been placed this morning and he was transferred from [facility] for surgical coronary artery revascularization by Dr. Cliffton Asters. Dr. Cliffton Asters discussed potential risks, benefits, and complications of the surgery. Patient agreed to proceed. He underwent a CABG x 3 with left radial artery harvest on 06/15/2020.

## 2020-06-19 MED ORDER — METOPROLOL TARTRATE 25 MG PO TABS: 12.5000 mg | ORAL_TABLET | Freq: Two times a day (BID) | ORAL | 1 refills | Status: DC

## 2020-06-19 MED ORDER — INFLUENZA VAC SPLIT QUAD 0.5 ML IM SUSY
0.5000 mL | PREFILLED_SYRINGE | INTRAMUSCULAR | Status: AC | PRN
  Administered 2020-06-19: 0.5 mL via INTRAMUSCULAR
  Filled 2020-06-19: qty 0.5

## 2020-06-19 MED ORDER — TRAZODONE HCL 100 MG PO TABS: 100.0000 mg | ORAL_TABLET | Freq: Every day | ORAL | 0 refills | Status: DC

## 2020-06-19 MED ORDER — PROMETHAZINE HCL 25 MG PO TABS
25.0000 mg | ORAL_TABLET | Freq: Four times a day (QID) | ORAL | 0 refills | Status: DC | PRN
Start: 2020-06-19 — End: 2020-07-30

## 2020-06-19 MED ORDER — OXYCODONE HCL 5 MG PO TABS: 5.0000 mg | ORAL_TABLET | ORAL | 0 refills | Status: DC | PRN

## 2020-06-19 MED ORDER — ASPIRIN 325 MG PO TBEC
325.0000 mg | DELAYED_RELEASE_TABLET | Freq: Every day | ORAL | 0 refills | Status: DC
Start: 2020-06-20 — End: 2021-11-07

## 2020-06-19 MED ORDER — AMLODIPINE BESYLATE 5 MG PO TABS: 5.0000 mg | ORAL_TABLET | Freq: Every day | ORAL | Status: DC

## 2020-06-19 MED ORDER — GABAPENTIN 100 MG PO CAPS
100.0000 mg | ORAL_CAPSULE | Freq: Three times a day (TID) | ORAL | 1 refills | Status: DC
Start: 2020-06-19 — End: 2020-07-30

## 2020-06-19 MED ORDER — ONDANSETRON HCL 4 MG PO TABS
4.0000 mg | ORAL_TABLET | Freq: Once | ORAL | Status: AC
  Administered 2020-06-19: 4 mg via ORAL
  Filled 2020-06-19: qty 1

## 2020-06-19 MED ORDER — AMLODIPINE BESYLATE 5 MG PO TABS
5.0000 mg | ORAL_TABLET | Freq: Every day | ORAL | 1 refills | Status: DC
Start: 2020-06-19 — End: 2020-08-16

## 2020-06-19 NOTE — Progress Notes (Addendum)
      [address]       [place] [address]             [phone]        4 Days Post-Op Procedure(s) (LRB): CORONARY ARTERY BYPASS GRAFTING (CABG) TIMES THREE USING HARVESTED LEFT RADIAL ARTERY, LEFT INTERNAL MAMMARY ARTERY,RIGHT GREATER SAPHENOUS VEIN,HARVESTED ENDOSCOPICALLY. Removal of balloon pump. (N/A) TRANSESOPHAGEAL ECHOCARDIOGRAM (TEE) (N/A)  Subjective: Patient had dizziness with ambulation yesterday when walking with cardiac rehab. He has had some intermittent nausea that he thinks is from pain medication.  Objective: Vital signs in last 24 hours: Temp:  [98.2 F (36.8 C)-99.5 F (37.5 C)] 98.2 F (36.8 C) (10/23 0747) Pulse Rate:  [70-83] 80 (10/23 0747) Cardiac Rhythm: Normal sinus rhythm (10/23 0803) Resp:  [15-16] 15 (10/23 0747) BP: (128-140)/(82-85) 138/84 (10/23 0747) SpO2:  [95 %-100 %] 95 % (10/23 0747) Weight:  [75.1 kg] 75.1 kg (10/23 0500)  Pre op weight 78.8 kg Current Weight  06/19/20 75.1 kg      Intake/Output from previous day: 10/22 0701 - 10/23 0700 In: -  Out: 2250 [Urine:2250]   Physical Exam:  Cardiovascular: RRR Pulmonary: Slightly diminished right base Abdomen: Soft, non tender, bowel sounds present. Extremities: No LE edema.  Motor/sensory intact LUE Wounds: Clean and dry.  No erythema or signs of infection. LUE wound dressing removed and wound is clean and dry. Mild ecchymosis.  Lab Results: CBC: Recent Labs    06/17/20 0524 06/18/20 0022  WBC 10.4 9.8  HGB 9.6* 9.2*  HCT 29.4* 28.5*  PLT 133* 136*   BMET:  Recent Labs    06/17/20 0524 06/18/20 0022  NA 136 138  K 3.9 3.9  CL 102 103  CO2 27 25  GLUCOSE 100* 98  BUN 9 11  CREATININE 1.06 1.14  CALCIUM 8.3* 8.1*    PT/INR:  Lab Results  Component Value Date   INR 1.2 06/15/2020   INR 1.0 06/15/2020   INR 1.0 06/14/2020   ABG:  INR: Will add last result for INR, ABG once components are confirmed Will add last 4 CBG results once  components are confirmed  Assessment/Plan:  1. CV - Maintaining SR with HR in the 70's. On Amlodipine 2.5 mg daily and Lopressor 12.5 mg bid. Will increase Amlodipine to 5 mg daily for better BP control. 2.  Pulmonary - On room air.  Encourage incentive spirometer. 3. Volume Overload - On Lasix 40 mg daily 4. Expected blood loss anemia-H and H this am slightly decreased to 9.2 and 28.5  7.  Mild thrombocytopenia-platelets 136,000 8. Regarding pain management, Dilaudid and Neurontin added yesterday;aready on Toradol. Per patient request, Oxy and Neurontin at discharge. Also, requesting Phenergan and Trazadone (has taken before) for sleep. 9. Likely discharge;chst tube sutures will be removed in the office after discharge  Michael Mainwaring M ZimmermanPA-C 06/19/2020,10:21 AM

## 2020-06-19 NOTE — Plan of Care (Signed)
  Problem: Education: Goal: Knowledge of General Education information will improve Description: Including pain rating scale, medication(s)/side effects and non-pharmacologic comfort measures Outcome: Completed/Met   Problem: Health Behavior/Discharge Planning: Goal: Ability to manage health-related needs will improve Outcome: Completed/Met   Problem: Clinical Measurements: Goal: Ability to maintain clinical measurements within normal limits will improve Outcome: Completed/Met Goal: Will remain free from infection Outcome: Completed/Met Goal: Diagnostic test results will improve Outcome: Completed/Met Goal: Respiratory complications will improve Outcome: Completed/Met Goal: Cardiovascular complication will be avoided Outcome: Completed/Met   Problem: Activity: Goal: Risk for activity intolerance will decrease Outcome: Completed/Met   Problem: Nutrition: Goal: Adequate nutrition will be maintained Outcome: Completed/Met   Problem: Coping: Goal: Level of anxiety will decrease Outcome: Completed/Met   Problem: Elimination: Goal: Will not experience complications related to bowel motility Outcome: Completed/Met Goal: Will not experience complications related to urinary retention Outcome: Completed/Met   Problem: Pain Managment: Goal: General experience of comfort will improve Outcome: Completed/Met   Problem: Safety: Goal: Ability to remain free from injury will improve Outcome: Completed/Met   Problem: Skin Integrity: Goal: Risk for impaired skin integrity will decrease Outcome: Completed/Met   Problem: Cardiac: Goal: Ability to achieve and maintain adequate cardiopulmonary perfusion will improve Outcome: Completed/Met

## 2020-06-19 NOTE — Progress Notes (Signed)
Pt feeling much better. Off monitor. Able to walk hall 340 ft independently. Does not need RW. No c/o. Discussed IS, sternal precautions, diet, exercise, and CRPII. Will refer to [facility]. Very receptive. [id number] Ethelda Chick CES, ACSM 11:56 AM 06/19/2020

## 2020-06-19 NOTE — Progress Notes (Signed)
Pt got discharged to home, discharge instructions provided and patient showed understanding to it, IV taken out,Telemonitor DC,pt left unit in wheelchair with all of the belongings accompanied with a family member (wife)  Tien Spooner,RN 

## 2020-06-21 ENCOUNTER — Other Ambulatory Visit: Payer: Self-pay

## 2020-06-21 ENCOUNTER — Encounter: Payer: Self-pay | Admitting: *Deleted

## 2020-06-21 ENCOUNTER — Other Ambulatory Visit: Payer: Self-pay | Admitting: *Deleted

## 2020-06-21 ENCOUNTER — Other Ambulatory Visit: Payer: Self-pay | Admitting: Thoracic Surgery (Cardiothoracic Vascular Surgery)

## 2020-06-21 DIAGNOSIS — E785 Hyperlipidemia, unspecified: Secondary | ICD-10-CM

## 2020-06-21 DIAGNOSIS — I200 Unstable angina: Secondary | ICD-10-CM

## 2020-06-21 MED ORDER — ROSUVASTATIN CALCIUM 20 MG PO TABS: 20.0000 mg | ORAL_TABLET | Freq: Every day | ORAL | 3 refills | Status: DC

## 2020-06-21 MED ORDER — NITROGLYCERIN 0.4 MG SL SUBL: 0.4000 mg | SUBLINGUAL_TABLET | SUBLINGUAL | 6 refills | Status: DC | PRN

## 2020-06-21 MED FILL — OMRON 3 SERIES BP MONITOR D: 30 days supply | Qty: 1 | Fill #0

## 2020-06-21 NOTE — Patient Outreach (Signed)
[facility]) Care Management  06/21/2020  Michael Franklin March 30, 1976 [id number]   Transition of care call/case closure   Referral received:06/16/20 Initial outreach:06/21/20 Insurance: [employer]    Subjective: Initial successful telephone call to patient's preferred number in order to complete transition of care assessment; 2 HIPAA identifiers verified. Explained purpose of call and completed transition of care assessment.  States he is feeling tough , but recovering.  denies post-operative problems, says surgical incisions are unremarkable. He states that he is having some difficulty with pain management , using oxycodone prn experiences nausea, and takes prn phenergan. He reports having some incision as well as chest pain , he has placed call to surgeon office to discuss need for sl nitroglycerin as well as crestor prescription not sent to pharmacy. He discussed appetite has not been good, reviewed trying smaller meals, having as snack time of taking pain medication if tolerated He reports passing gas, no bowel movement he has taken stool softener and is not concerned regarding constipation.   Spouse/family are assisting with his recovery. He reports tolerating mobility in home and continues to work on using incentive spirometry. Reinforced monitoring daily weights at home and notifying MD of sudden weight gain of 3 pounds in a day, swelling or shortness of breath per discharge instruction,he reports that he will purchase scales and monitor.   Reviewed accessing the following [place] Benefits :   He does use   [facility] as needed.  Discussed benefit of getting blood pressure monitor at pharmacy at reasonable cost.   During call patient received incoming call from MD Office requested return call.  Returned call to patient he reports being able to speak with surgeon office and prescriptions have been sent as requested.       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   Assessment:  Patient voices good understanding of all discharge instructions.  See transition of care flowsheet for assessment details.   Plan:  Reviewed hospital discharge diagnosis of NSTEMI, CABG   and discharge treatment plan using hospital discharge instructions, assessing medication adherence, reviewing problems requiring provider notification, and discussing the importance of follow up with surgeon, primary care provider and/or specialists as directed.  Provided patient with contact number for [facility] regarding blood pressure monitor, call to [facility] outpatient pharmacy verified that they have bp monitors on hand patient made aware.   No ongoing care management needs identified so will close case to [facility] Care Management services and route successful outreach letter with [facility] Care Management pamphlet and 24 Hour Nurse Line Magnet to [employer] Care Management clinical pool to be mailed to patient's home address.  Thanked patient for their services to [facility].   [name], RN, BSN  [facility] Care Management,Care Management Coordinator  [phone]- Mobile [phone]- Toll Free Main Office

## 2020-06-25 ENCOUNTER — Ambulatory Visit
Admission: RE | Admit: 2020-06-25 | Discharge: 2020-06-25 | Disposition: A | Discharge status: Home / Self Care | Payer: 59 | Source: Ambulatory Visit | Attending: Thoracic Surgery (Cardiothoracic Vascular Surgery) | Admitting: Thoracic Surgery (Cardiothoracic Vascular Surgery)

## 2020-06-25 ENCOUNTER — Other Ambulatory Visit: Payer: Self-pay

## 2020-06-25 ENCOUNTER — Encounter: Payer: Self-pay | Admitting: Thoracic Surgery (Cardiothoracic Vascular Surgery)

## 2020-06-25 ENCOUNTER — Other Ambulatory Visit: Payer: Self-pay | Admitting: Thoracic Surgery (Cardiothoracic Vascular Surgery)

## 2020-06-25 ENCOUNTER — Ambulatory Visit (INDEPENDENT_AMBULATORY_CARE_PROVIDER_SITE_OTHER): Payer: Self-pay | Admitting: Thoracic Surgery (Cardiothoracic Vascular Surgery)

## 2020-06-25 VITALS — BP 136/88 | HR 83 | Temp 97.9°F | Resp 20 | Ht 69.0 in | Wt 163.0 lb

## 2020-06-25 DIAGNOSIS — I10 Essential (primary) hypertension: Secondary | ICD-10-CM

## 2020-06-25 DIAGNOSIS — Z951 Presence of aortocoronary bypass graft: Secondary | ICD-10-CM

## 2020-06-25 DIAGNOSIS — J90 Pleural effusion, not elsewhere classified: Secondary | ICD-10-CM | POA: Diagnosis not present

## 2020-06-25 MED ORDER — METHOCARBAMOL 500 MG PO TABS: 500.0000 mg | ORAL_TABLET | Freq: Four times a day (QID) | ORAL | 0 refills | Status: DC | PRN

## 2020-06-25 MED ORDER — METOPROLOL TARTRATE 25 MG PO TABS: 12.5000 mg | ORAL_TABLET | Freq: Two times a day (BID) | ORAL | 1 refills | Status: DC

## 2020-06-25 NOTE — Progress Notes (Signed)
      [address]       [place] [address]             [phone]        [facility] Medical Record #[id number] Date of Birth: 1976/01/23  Referring: Iran Ouch, MD Primary Care: Wendall Stade, MD Primary Cardiologist:No primary care provider on file.  Reason for visit:   follow-up  History of Present Illness:     Patient comes in for standard 1 week appointment.  Overall he is doing well.  He has not been using his pain medication and complains of chest wall pain and some shortness of breath.  Physical Exam: BP 136/88   Pulse 83   Temp 97.9 F (36.6 C) (Skin)   Resp 20   Ht 5\' 9"  (1.753 m)   Wt 163 lb (73.9 kg)   SpO2 95% Comment: RA  BMI 24.07 kg/m   Alert NAD Incision clean.  Sternum stable Right arm incision is also clean No peripheral edema   Diagnostic Studies & Laboratory data: CXR: Mild atelectasis     Assessment / Plan:   44 year old male status post CABG. Doing well Giving him a prescription for Robaxin and instructed him to be more consistent with his pain medication. He will meet with his cardiologist in the next week or so He will follow back up with me in 1 month for a chest x-ray   49 06/25/2020 4:47 PM

## 2020-06-30 ENCOUNTER — Other Ambulatory Visit: Payer: Self-pay

## 2020-07-01 ENCOUNTER — Other Ambulatory Visit: Payer: Self-pay | Admitting: *Deleted

## 2020-07-01 MED ORDER — ONDANSETRON 4 MG PO TBDP: 4.0000 mg | ORAL_TABLET | Freq: Three times a day (TID) | ORAL | 0 refills | Status: DC | PRN

## 2020-07-01 MED ORDER — LIDOCAINE 5 % EX PTCH: 1.0000 | MEDICATED_PATCH | CUTANEOUS | 0 refills | Status: DC

## 2020-07-01 NOTE — Progress Notes (Signed)
Mr. Bontempo called c/o increased nausea accompanied with vomiting "2 gallons" this morning. Pt requests phenergan be changed to sublingual zofran. Pt also c/o pain stating he is taking Ibuprofen, Tylenol, and Oxy IR as told every 6-8hrs. Pt states he does not want to take the Robaxin. Pt encouraged per Dr. Cliffton Asters to continue taking the Robaxin and to continue alternating Ibuprofen and Tylenol with his Oxy. Pt requests his Oxy be changed to an extended release pill, will ask Dr. Cliffton Asters. Per Dr. Cliffton Asters, a prescription for Zofran sublingual and Lidocaine patches have been sent to pts preferred pharmacy, [facility].

## 2020-07-06 ENCOUNTER — Other Ambulatory Visit: Payer: Self-pay | Admitting: Thoracic Surgery (Cardiothoracic Vascular Surgery)

## 2020-07-06 ENCOUNTER — Other Ambulatory Visit: Payer: Self-pay

## 2020-07-06 MED ORDER — OXYCODONE HCL 5 MG PO TABS: 5.0000 mg | ORAL_TABLET | ORAL | 0 refills | Status: DC | PRN

## 2020-07-06 MED ORDER — FAMOTIDINE 20 MG PO TABS: 20.0000 mg | ORAL_TABLET | Freq: Two times a day (BID) | ORAL | 0 refills | Status: DC

## 2020-07-09 ENCOUNTER — Ambulatory Visit: Payer: 59 | Admitting: Nurse Practitioner

## 2020-07-12 ENCOUNTER — Telehealth (HOSPITAL_COMMUNITY): Payer: Self-pay

## 2020-07-12 NOTE — Telephone Encounter (Signed)
Faxed cardiac rehab referral to [facility] cardiac rehab  Per phase I

## 2020-07-13 ENCOUNTER — Encounter: Payer: Self-pay | Admitting: Cardiovascular Disease

## 2020-07-13 ENCOUNTER — Ambulatory Visit (INDEPENDENT_AMBULATORY_CARE_PROVIDER_SITE_OTHER): Payer: 59 | Admitting: Cardiovascular Disease

## 2020-07-13 ENCOUNTER — Other Ambulatory Visit: Payer: Self-pay

## 2020-07-13 VITALS — BP 122/79 | HR 54 | Temp 97.9°F | Ht 69.0 in | Wt 167.6 lb

## 2020-07-13 DIAGNOSIS — E785 Hyperlipidemia, unspecified: Secondary | ICD-10-CM | POA: Diagnosis not present

## 2020-07-13 DIAGNOSIS — Z951 Presence of aortocoronary bypass graft: Secondary | ICD-10-CM

## 2020-07-13 MED ORDER — ZOLPIDEM TARTRATE 5 MG PO TABS: 5.0000 mg | ORAL_TABLET | Freq: Every evening | ORAL | 0 refills | Status: DC | PRN

## 2020-07-13 NOTE — Patient Instructions (Addendum)
Medication Instructions:  STOP TAKING METOPROLOL STOP TAKING TRAZODONE BEGIN TAKING AMBIEN 5 MG NIGHTLY AS NEEDED FOR INSOMNIA *If you need a refill on your cardiac medications before your next appointment, please call your pharmacy*   Lab Work: NONE ORDERED If you have labs (blood work) drawn today and your tests are completely normal, you will receive your results only by: Marland Kitchen MyChart Message (if you have MyChart) OR . A paper copy in the mail If you have any lab test that is abnormal or we need to change your treatment, we will call you to review the results.   Testing/Procedures: NONE ORDERED   Follow-Up: At [facility], you and your health needs are our priority.  As part of our continuing mission to provide you with exceptional heart care, we have created designated Provider Care Teams.  These Care Teams include your primary Cardiologist (physician) and Advanced Practice Providers (APPs -  Physician Assistants and Nurse Practitioners) who all work together to provide you with the care you need, when you need it.  We recommend signing up for the patient portal called "MyChart".  Sign up information is provided on this After Visit Summary.  MyChart is used to connect with patients for Virtual Visits (Telemedicine).  Patients are able to view lab/test results, encounter notes, upcoming appointments, etc.  Non-urgent messages can be sent to your provider as well.   To learn more about what you can do with MyChart, go to [email or web address].    Your next appointment:   3 month(s)  The format for your next appointment:   In Person  Provider:   Lorine Bears, MD   Other Instructions You have been referred to Dr. Rennis Golden for management of your hyperlipidemia. We will schedule that appointment today.

## 2020-07-13 NOTE — Progress Notes (Signed)
Cardiology Office Note   Date:  07/13/2020   ID:  Michael Franklin, DOB 1976-05-21, MRN [id number]  PCP:  Wendall Stade, MD  Cardiologist:   Lorine Bears, MD   No chief complaint on file.     History of Present Illness: Michael Franklin is a 44 y.o. male who presents for a follow-up visit regarding coronary artery disease status post recent CABG. He is one of our hospitalists.  He has known history of hyperlipidemia and family history of coronary artery disease.  His father had CABG in his 89s.  The patient has been very healthy throughout his life with excellent lifestyle and he plays soccer on a regular basis.  He did not tolerate atorvastatin in the past due to myalgia.  He was seen last month due to exertional chest pain with very concerning story.  EKG was unremarkable with the exception of possible septal Q waves.  The patient was hospitalized and was found to have mildly elevated troponin.  Echocardiogram showed an EF of 50 to 55% with mild mitral regurgitation.  Cardiac catheterization surprisingly showed severe three-vessel coronary artery disease with an occluded ostial LAD which was significantly calcified with collaterals noted from the right coronary artery, 95% stenosis in large ostial ramus and severe right PDA stenosis.  LVEDP was 24.  CABG was recommended.  The patient was observed overnight but started having severe refractory angina and thus an intra-aortic balloon pump was placed and the patient was transferred to [facility] where he underwent CABG by Dr. Cliffton Asters.  Postoperative course was complicated by severe pain.  Patient is recovering slowly.  He does complain of dizziness.  He is on small dose metoprolol and has known history of baseline bradycardia.  He has been taking rosuvastatin 20 mg daily but reports moderate bilateral leg pain.  He has been using high-dose ibuprofen and acetaminophen for pain control and continues have issues with insomnia.   Past Medical  History:  Diagnosis Date  . Hyperlipidemia     Past Surgical History:  Procedure Laterality Date  . CORONARY ARTERY BYPASS GRAFT N/A 06/15/2020   Procedure: CORONARY ARTERY BYPASS GRAFTING (CABG) TIMES THREE USING HARVESTED LEFT RADIAL ARTERY, LEFT INTERNAL MAMMARY ARTERY,RIGHT GREATER SAPHENOUS VEIN,HARVESTED ENDOSCOPICALLY. Removal of balloon pump.;  Surgeon: Corliss Skains, MD;  Location: [facility] OR;  Service: Open Heart Surgery;  Laterality: N/A;  . CORONARY/GRAFT ACUTE MI REVASCULARIZATION N/A 06/14/2020   Procedure: Coronary/Graft Acute MI Revascularization;  Surgeon: Iran Ouch, MD;  Location: [facility] INVASIVE CV LAB;  Service: Cardiovascular;  Laterality: N/A;  . IABP INSERTION N/A 06/15/2020   Procedure: IABP Insertion;  Surgeon: Iran Ouch, MD;  Location: [facility] INVASIVE CV LAB;  Service: Cardiovascular;  Laterality: N/A;  . LEFT HEART CATH AND CORONARY ANGIOGRAPHY N/A 06/14/2020   Procedure: LEFT HEART CATH AND CORONARY ANGIOGRAPHY;  Surgeon: Iran Ouch, MD;  Location: [facility] INVASIVE CV LAB;  Service: Cardiovascular;  Laterality: N/A;  . TEE WITHOUT CARDIOVERSION N/A 06/15/2020   Procedure: TRANSESOPHAGEAL ECHOCARDIOGRAM (TEE);  Surgeon: Corliss Skains, MD;  Location: [facility] OR;  Service: Open Heart Surgery;  Laterality: N/A;     Current Outpatient Medications  Medication Sig Dispense Refill  . amLODipine (NORVASC) 5 MG tablet Take 1 tablet (5 mg total) by mouth daily. 30 tablet 1  . aspirin EC 325 MG EC tablet Take 1 tablet (325 mg total) by mouth daily. 30 tablet 0  . famotidine (PEPCID) 20 MG tablet Take 1 tablet (20 mg total)  by mouth 2 (two) times daily. 60 tablet 0  . gabapentin (NEURONTIN) 100 MG capsule Take 1 capsule (100 mg total) by mouth 3 (three) times daily. 90 capsule 1  . lidocaine (LIDODERM) 5 % Place 1 patch onto the skin daily. Remove & Discard patch within 12 hours or as directed by MD 30 patch 0  . methocarbamol (ROBAXIN) 500 MG tablet Take  1 tablet (500 mg total) by mouth every 6 (six) hours as needed for muscle spasms. 60 tablet 0  . nitroGLYCERIN (NITROSTAT) 0.4 MG SL tablet Place 1 tablet (0.4 mg total) under the tongue every 5 (five) minutes as needed for chest pain. 50 tablet 6  . ondansetron (ZOFRAN ODT) 4 MG disintegrating tablet Take 1 tablet (4 mg total) by mouth every 8 (eight) hours as needed for up to 20 doses for nausea or vomiting. 20 tablet 0  . oxyCODONE (OXY IR/ROXICODONE) 5 MG immediate release tablet Take 1 tablet (5 mg total) by mouth every 4 (four) hours as needed for severe pain. 30 tablet 0  . promethazine (PHENERGAN) 25 MG tablet Take 1 tablet (25 mg total) by mouth every 6 (six) hours as needed for nausea or vomiting. 30 tablet 0  . rosuvastatin (CRESTOR) 20 MG tablet Take 1 tablet (20 mg total) by mouth daily. 30 tablet 3  . zolpidem (AMBIEN) 5 MG tablet Take 1 tablet (5 mg total) by mouth at bedtime as needed (Insomnia). 30 tablet 0   No current facility-administered medications for this visit.    Allergies:   Penicillins    Social History:  The patient  reports that he has never smoked. He has never used smokeless tobacco. He reports that he does not drink alcohol and does not use drugs.   Family History:  The patient's family history includes Asthma in his mother; CAD in his father; Hyperlipidemia in his father; Hypertension in his father.    ROS:  Please see the history of present illness.   Otherwise, review of systems are positive for none.   All other systems are reviewed and negative.    PHYSICAL EXAM: VS:  BP 122/79   Pulse (!) 54   Temp 97.9 F (36.6 C)   Ht 5\' 9"  (1.753 m)   Wt 167 lb 9.6 oz (76 kg)   SpO2 98%   BMI 24.75 kg/m  , BMI Body mass index is 24.75 kg/m. GEN: Well nourished, well developed, in no acute distress  HEENT: normal  Neck: no JVD, carotid bruits, or masses Cardiac: RRR; no murmurs, rubs, or gallops,no edema  Respiratory:  clear to auscultation bilaterally,  normal work of breathing GI: soft, nontender, nondistended, + BS MS: no deformity or atrophy  Skin: warm and dry, no rash Neuro:  Strength and sensation are intact Psych: euthymic mood, full affect   EKG:  EKG is ordered today. The ekg ordered today demonstrates sinus bradycardia with inverted T waves in the septal leads.   Recent Labs: 06/13/2020: ALT 20; TSH 1.663 06/16/2020: Magnesium 2.0 06/18/2020: BUN 11; Creatinine, Ser 1.14; Hemoglobin 9.2; Platelets 136; Potassium 3.9; Sodium 138    Lipid Panel    Component Value Date/Time   CHOL 193 06/14/2020 1729   TRIG 65 06/14/2020 1729   HDL 43 06/14/2020 1729   CHOLHDL 4.5 06/14/2020 1729   VLDL 13 06/14/2020 1729   LDLCALC 137 (H) 06/14/2020 1729      Wt Readings from Last 3 Encounters:  07/13/20 167 lb 9.6 oz (76 kg)  06/25/20 163 lb (73.9 kg)  06/19/20 165 lb 9.1 oz (75.1 kg)       No flowsheet data found.    ASSESSMENT AND PLAN:  1.  Coronary artery disease involving native coronary arteries: Status post recent CABG.  He is making reasonable recovery overall and is planning to start increasing physical activities.  He wants to be able to resume playing soccer and I explained to him this likely will take at least 3 to 6 months.  Continue aspirin 325 mg daily. He has been having dizziness which I suspect is likely due to metoprolol given his known baseline bradycardia.  Given that his ejection fraction was normal, I elected to discontinue metoprolol. He continues to be on amlodipine to prevent spasm in his radial artery graft.  2.  Hyperlipidemia: He is on rosuvastatin 20 mg daily but seems to be having myalgia.  I am going to refer the patient to Dr. Rennis Golden to evaluate for causes of aggressive atherosclerosis given the very unexpected cardiac cath findings at such young age with very healthy lifestyle.  3.  Essential hypertension: Continue treatment with amlodipine.  4.  Insomnia: Not responding to trazodone.  I  gave him 30 tablets of Ambien 5 mg to be used as needed.   Disposition:   FU with me in 3 months  Signed,  Lorine Bears, MD  07/13/2020 5:23 PM    [facility]

## 2020-07-16 NOTE — Telephone Encounter (Signed)
Attempted to call patient to schedule sooner lipid clinic visit. He did not have a name-verified VM - did not leave message

## 2020-07-17 ENCOUNTER — Telehealth: Payer: Self-pay | Admitting: Cardiology

## 2020-07-17 NOTE — Telephone Encounter (Signed)
  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 He has seen Dr. Kirke Corin on 07/13/2020 in clinic.   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 He was recently discontinued on Metoprolol 12.5mg  BID on 07/13/2020 due to history of bradycardia. But recently has had more frequency of symptomatic PVCs. He has taken ASA and Lopressor 1 hr prior to the call. I agreed with this decision.   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 I suspect this is likely driven due to microvascular dysfunction. I recommended reinitiating the low dose lopressor.   Dr. Flossie Dibble agreed to the treatment plan.   He might benefit from 14 day holter monitor, repeat Echo. I will communicate with Dr. Kirke Corin after the weekend.    Signed: Glenford Bayley 07/17/2020, 5:29 PM

## 2020-07-20 ENCOUNTER — Telehealth (HOSPITAL_COMMUNITY): Payer: Self-pay

## 2020-07-20 NOTE — Telephone Encounter (Signed)
Cardiac Rehab Note:  Unsuccessful telephone encounter to Michael Franklin to follow up on his request to attend cardiac rehab. Unable to leave message as "call cannot be completed at this time". Per EMR review, it is recommended that patient have follow up with cardiology secondary to ongoing symptomatic PVCs. Re-prescribed lopressor 07/17/20 after previously discontinued for bradycardia. Will await recommendation from cardiology on further testing prior to enrollment in CR. Of note, patient has surgical follow-up 07/30/20.   Plan: Will attempt to contact patient at later time  Madiha Bambrick E. Suzie Portela RN, BSN [place]  Cardiac and Pulmonary Rehabilitation Phone: [phone] Fax: [phone]

## 2020-07-20 NOTE — Telephone Encounter (Signed)
Jasmine from [facility] cardiac rehab called and state that they are scheduling out until January 2022 and that pt stated that he wanted to get in sooner, advised Leavy Cella that we are scheduling out to mid December and it changes daily also advised that pt has a follow up with surgeon on 12/3 and that he may have to complete that before scheduling. Will send a message to the nurse Portia RN for review.

## 2020-07-24 ENCOUNTER — Other Ambulatory Visit: Payer: Self-pay | Admitting: Physician Assistant

## 2020-07-27 ENCOUNTER — Other Ambulatory Visit: Payer: Self-pay | Admitting: Thoracic Surgery (Cardiothoracic Vascular Surgery)

## 2020-07-27 MED FILL — ROSUVASTATIN CALCIUM 20 MG: 20 | 30 days supply | Qty: 30 | Fill #0

## 2020-07-28 ENCOUNTER — Other Ambulatory Visit: Payer: Self-pay | Admitting: Internal Medicine

## 2020-07-28 ENCOUNTER — Encounter: Payer: Self-pay | Admitting: Internal Medicine

## 2020-07-28 ENCOUNTER — Telehealth (INDEPENDENT_AMBULATORY_CARE_PROVIDER_SITE_OTHER): Payer: 59 | Admitting: Internal Medicine

## 2020-07-28 VITALS — BP 121/75 | HR 57

## 2020-07-28 DIAGNOSIS — I2510 Atherosclerotic heart disease of native coronary artery without angina pectoris: Secondary | ICD-10-CM

## 2020-07-28 DIAGNOSIS — E785 Hyperlipidemia, unspecified: Secondary | ICD-10-CM | POA: Diagnosis not present

## 2020-07-28 DIAGNOSIS — T466X5A Adverse effect of antihyperlipidemic and antiarteriosclerotic drugs, initial encounter: Secondary | ICD-10-CM | POA: Diagnosis not present

## 2020-07-28 DIAGNOSIS — M791 Myalgia, unspecified site: Secondary | ICD-10-CM | POA: Diagnosis not present

## 2020-07-28 DIAGNOSIS — Z951 Presence of aortocoronary bypass graft: Secondary | ICD-10-CM

## 2020-07-28 DIAGNOSIS — I10 Essential (primary) hypertension: Secondary | ICD-10-CM

## 2020-07-28 MED ORDER — ROSUVASTATIN CALCIUM 5 MG PO TABS: 5.0000 mg | ORAL_TABLET | Freq: Every day | ORAL | 3 refills | Status: DC

## 2020-07-28 MED ORDER — REPATHA SURECLICK 140 MG/ML ~~LOC~~ SOAJ: 1.0000 | SUBCUTANEOUS | 11 refills | Status: DC

## 2020-07-28 MED FILL — REPATHA SURECLICK 140 MG/ML: 140 | 28 days supply | Qty: 2 | Fill #0

## 2020-07-28 NOTE — Patient Instructions (Addendum)
Medication Instructions:  STOP crestor 20mg  and REDUCE to crestor 5mg  daily -- please contact our office if not tolerated  Dr. recommends Repatha 140mg /mL (PCSK9). This is an injectable cholesterol medication self-administered once every 14 days. This medication will likely need a pre-approval with your insurance company before you will be able to pick up at the pharmacy.   - Administer medication in area of fatty tissue such as abdomen, outer thigh, back of upper arm - and rotate site with each injection - Store medication in refrigerator until ready to administer - allow to sit at room temp for 30 mins - 1 hour prior to injection - Dispose of medication in a SHARPS container - your pharmacy should be able to direct you on this and proper disposal   If you need a co-pay card for Repatha: >> paying for Repatha or red box that says "Repatha Copay Card" in top right  Lab Work: FASTING blood work in 3 months - NMR lipoprofile, LP(a), apolipoprotein B --- please complete about 1 week before your next visit with Dr. Rennis Golden  --- we will send you a reminder about the blood work  If you have labs (blood work) drawn today and your tests are completely normal, you will receive your results only by: MyChart Message (if you have MyChart) OR . A paper copy in the mail If you have any lab test that is abnormal or we need to change your treatment, we will call you to review the results.   Follow-Up: At [facility], you and your health needs are our priority.  As part of our continuing mission to provide you with exceptional heart care, we have created designated Provider Care Teams.  These Care Teams include your primary Cardiologist (physician) and Advanced Practice Providers (APPs -  Physician Assistants and Nurse Practitioners) who all work together to provide you with the care you need, when you need it.  We recommend signing up for the patient portal called "MyChart".  Sign up  information is provided on this After Visit Summary.  MyChart is used to connect with patients for Virtual Visits (Telemedicine).  Patients are able to view lab/test results, encounter notes, upcoming appointments, etc.  Non-urgent messages can be sent to your provider as well.   To learn more about what you can do with MyChart, go to Rennis Golden.    Your next appointment:   3 month(s) - lipid clinic  The format for your next appointment:   In Person  Provider:   K. Marland Kitchen Hilty, MD   Other Instructions

## 2020-07-28 NOTE — Progress Notes (Signed)
Virtual Visit via Video Note   This visit type was conducted due to national recommendations for restrictions regarding the COVID-19 Pandemic (e.g. social distancing) in an effort to limit this patient's exposure and mitigate transmission in our community.  Due to his co-morbid illnesses, this patient is at least at moderate risk for complications without adequate follow up.  This format is felt to be most appropriate for this patient at this time.  All issues noted in this document were discussed and addressed.  A limited physical exam was performed with this format.  Please refer to the patient's chart for his consent to telehealth for [facility].  Video Connection Lost Video connection was lost at < 50% of the duration of this visit, at which time the remainder of the visit was completed via audio only.    Date:  07/28/2020   ID:  Michael Franklin, DOB 1975/11/07, MRN [id number] The patient was identified using 2 identifiers.  Evaluation Performed:  New Patient Evaluation  Patient Location:  [address] [place] [address]  Provider location:   [address] [place], [place] [address]  PCP:  No primary care provider on file.  Cardiologist:  Lorine Bears, MD Electrophysiologist:  None   Chief Complaint: Manage dyslipidemia and cardiovascular risk  History of Present Illness:    Michael Franklin is a 44 y.o. male who presents via audio/video conferencing for a telehealth visit today.  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  He does have a family history of heart disease in his father who had CABG at 11 and is a retired Development worker, community, and it sounds like some remote heart disease in the family.  He recently had lipid testing which showed a trend toward rising LDL cholesterol.  A lipid profile 1 month ago showed total cholesterol 225, triglycerides 96, HDL  45 and LDL of 161.  Subsequently he was started on atorvastatin 20 mg daily but had significant myalgias with this and was unable to play sports.  He was actually playing a soccer match and after finishing noted that he had some substernal chest tightness.  That eventually resolved but the day after he had some exertional chest discomfort that did not abate.  He then presented for evaluation and was noted to have ST segment depression on his EKG and ultimately admitted for cardiac catheterization.  This demonstrated 3 vessel coronary artery disease with an occluded ostial LAD, collaterals and a decreased LVEF of 50 to 55%.  Based on these findings he was referred for urgent coronary bypass and had balloon pump insertion.  He received a LIMA to LAD, free radial graft to the DS and reverse saphenous vein graft to the PDA.  Since that time he has been recovering at home and has follow-up with Dr. Cliffton Asters next week.  He hopes to be cleared to start cardiac rehabilitation and back to work soon.  In addition to physical activity reports a healthy diet without significant fats or fried foods.  The patient does not have symptoms concerning for COVID-19 infection (fever, chills, cough, or new SHORTNESS OF BREATH).    Prior CV studies:   The following studies were reviewed today:  Chart reviewed, lab work  PMHx:  Past Medical History:  Diagnosis Date  . Hyperlipidemia     Past Surgical History:  Procedure Laterality Date  . CORONARY ARTERY BYPASS GRAFT N/A 06/15/2020  Procedure: CORONARY ARTERY BYPASS GRAFTING (CABG) TIMES THREE USING HARVESTED LEFT RADIAL ARTERY, LEFT INTERNAL MAMMARY ARTERY,RIGHT GREATER SAPHENOUS VEIN,HARVESTED ENDOSCOPICALLY. Removal of balloon pump.;  Surgeon: Corliss Skains, MD;  Location: [facility] OR;  Service: Open Heart Surgery;  Laterality: N/A;  . CORONARY/GRAFT ACUTE MI REVASCULARIZATION N/A 06/14/2020   Procedure: Coronary/Graft Acute MI Revascularization;  Surgeon: Iran Ouch, MD;  Location: [facility] INVASIVE CV LAB;  Service: Cardiovascular;  Laterality: N/A;  . IABP INSERTION N/A 06/15/2020   Procedure: IABP Insertion;  Surgeon: Iran Ouch, MD;  Location: [facility] INVASIVE CV LAB;  Service: Cardiovascular;  Laterality: N/A;  . LEFT HEART CATH AND CORONARY ANGIOGRAPHY N/A 06/14/2020   Procedure: LEFT HEART CATH AND CORONARY ANGIOGRAPHY;  Surgeon: Iran Ouch, MD;  Location: [facility] INVASIVE CV LAB;  Service: Cardiovascular;  Laterality: N/A;  . TEE WITHOUT CARDIOVERSION N/A 06/15/2020   Procedure: TRANSESOPHAGEAL ECHOCARDIOGRAM (TEE);  Surgeon: Corliss Skains, MD;  Location: [facility] OR;  Service: Open Heart Surgery;  Laterality: N/A;    FAMHx:  Family History  Problem Relation Age of Onset  . Asthma Mother   . Hypertension Father   . CAD Father   . Hyperlipidemia Father     SOCHx:   reports that he has never smoked. He has never used smokeless tobacco. He reports that he does not drink alcohol and does not use drugs.  ALLERGIES:  Allergies  Allergen Reactions  . Atorvastatin     myalgias  . Crestor [Rosuvastatin]     higher doses cause myalgias  . Penicillins     MEDS:  Current Meds  Medication Sig  . amLODipine (NORVASC) 5 MG tablet Take 1 tablet (5 mg total) by mouth daily.  Marland Kitchen aspirin EC 325 MG EC tablet Take 1 tablet (325 mg total) by mouth daily.  . famotidine (PEPCID) 20 MG tablet Take 1 tablet (20 mg total) by mouth 2 (two) times daily.  Marland Kitchen gabapentin (NEURONTIN) 100 MG capsule Take 1 capsule (100 mg total) by mouth 3 (three) times daily.  . methocarbamol (ROBAXIN) 500 MG tablet Take 1 tablet (500 mg total) by mouth every 6 (six) hours as needed for muscle spasms.  . metoprolol tartrate (LOPRESSOR) 25 MG tablet Take 12.5 mg by mouth in the morning and at bedtime.  . nitroGLYCERIN (NITROSTAT) 0.4 MG SL tablet Place 1 tablet (0.4 mg total) under the tongue every 5 (five) minutes as needed for chest pain.  Marland Kitchen ondansetron (ZOFRAN  ODT) 4 MG disintegrating tablet Take 1 tablet (4 mg total) by mouth every 8 (eight) hours as needed for up to 20 doses for nausea or vomiting.  Marland Kitchen oxyCODONE (OXY IR/ROXICODONE) 5 MG immediate release tablet Take 1 tablet (5 mg total) by mouth every 4 (four) hours as needed for severe pain.  . promethazine (PHENERGAN) 25 MG tablet Take 1 tablet (25 mg total) by mouth every 6 (six) hours as needed for nausea or vomiting.  Marland Kitchen zolpidem (AMBIEN) 5 MG tablet Take 1 tablet (5 mg total) by mouth at bedtime as needed (Insomnia).  . [DISCONTINUED] rosuvastatin (CRESTOR) 20 MG tablet Take 1 tablet (20 mg total) by mouth daily.     ROS: Pertinent items noted in HPI and remainder of comprehensive ROS otherwise negative.  Labs/Other Tests and Data Reviewed:    Recent Labs: 06/13/2020: ALT 20; TSH 1.663 06/16/2020: Magnesium 2.0 06/18/2020: BUN 11; Creatinine, Ser 1.14; Hemoglobin 9.2; Platelets 136; Potassium 3.9; Sodium 138   Recent Lipid Panel Lab Results  Component  Value Date/Time   CHOL 193 06/14/2020 05:29 PM   TRIG 65 06/14/2020 05:29 PM   HDL 43 06/14/2020 05:29 PM   CHOLHDL 4.5 06/14/2020 05:29 PM   LDLCALC 137 (H) 06/14/2020 05:29 PM    Wt Readings from Last 3 Encounters:  07/13/20 167 lb 9.6 oz (76 kg)  06/25/20 163 lb (73.9 kg)  06/19/20 165 lb 9.1 oz (75.1 kg)     Exam:    Vital Signs:  BP 121/75   Pulse (!) 57   SpO2 99%    Exam not performed due to telephone visit  ASSESSMENT & PLAN:    1. Multivessel coronary artery disease status post CABG x3 (LIMA to LAD, free radial to ramus intermedius and reverse saphenous vein graft to PDA)-05/2020 2. Mixed dyslipidemia, goal LDL less than 70 3. Family history of coronary artery disease in his father 334. 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  Obviously is concerning that he has a very young age of onset of heart disease despite the fact that he was very physically active and eat a healthy  diet.  There is clearly genetic risk factors that put him at increased risk for this including possible familial hyperlipidemia.  Studies show that 1 can have LDL as low as 150 and still potentially have this.  His father did have onset of heart disease probably in his 6450s with CABG in his 660s.  This is not necessarily premature and there is no significant lineage of early onset heart disease in his family suggesting FH.  The other possibility could be an elevated LP(a).  This should be considered for testing because it is an option for treatment including an upcoming clinical trial that we will be performing.  Unfortunately, he cannot tolerate atorvastatin at high doses as well as rosuvastatin high potency due to significant myalgias.  I have encouraged him now to discontinue the rosuvastatin and if his symptoms resolve we will likely trial a low-dose of rosuvastatin 5 mg daily which if tolerated would be the highest possible dose.  He should not start that until January to allow subsequent washout.  In addition though, he will not reach target and should be considered for PCSK9 inhibitor to drive LDL less than 70.  I would recommend Repatha 140 mg every 2 weeks.  We will seek prior authorization for this and plan repeat lipids in about 3 to 4 months with a LP(a), lipid NMR and APO B.  Consider possible genetic testing if this work-up is not revealing as to the possible causes of his premature coronary disease.  I have encouraged him to educate his family members about his heart disease and recommend at minimum lipid testing of his children.  Thanks again for the kind referral.  Plan follow-up with me in 3 to 4 months.  COVID-19 Education: The signs and symptoms of COVID-19 were discussed with the patient and how to seek care for testing (follow up with PCP or arrange E-visit).  The importance of social distancing was discussed today.  Patient Risk:   After full review of this patients clinical status, I  feel that they are at least moderate risk at this time.  Time:   Today, I have spent 25 minutes with the patient with telehealth technology discussing dyslipidemia, genetic risk factors for coronary disease, statin side effects including myalgias, mechanism of action of PCSK9 inhibitors, current and future research trials.     Medication Adjustments/Labs and Tests Ordered: Current  medicines are reviewed at length with the patient today.  Concerns regarding medicines are outlined above.   Tests Ordered: Orders Placed This Encounter  Procedures  . Lipoprotein A (LPA)  . NMR, lipoprofile  . Apolipoprotein B    Medication Changes: Meds ordered this encounter  Medications  . rosuvastatin (CRESTOR) 5 MG tablet    Sig: Take 1 tablet (5 mg total) by mouth daily.    Dispense:  90 tablet    Refill:  3  . Evolocumab (REPATHA SURECLICK) 140 MG/ML SOAJ    Sig: Inject 1 Dose into the skin every 14 (fourteen) days.    Dispense:  2 mL    Refill:  11    Disposition:  in 3 month(s)  Chrystie Nose, MD, [facility], FACP  [place]  [facility]  Medical Director of the Advanced Lipid Disorders &  Cardiovascular Risk Reduction Clinic Diplomate of the American Board of Clinical Lipidology Attending Cardiologist  Direct Dial: [phone]  Fax: [phone]  Website:  [email or web address]  Chrystie Nose, MD  07/28/2020 10:00 AM

## 2020-07-29 ENCOUNTER — Other Ambulatory Visit: Payer: Self-pay | Admitting: Thoracic Surgery (Cardiothoracic Vascular Surgery)

## 2020-07-29 DIAGNOSIS — Z951 Presence of aortocoronary bypass graft: Secondary | ICD-10-CM

## 2020-07-29 MED FILL — ROSUVASTATIN CALCIUM 5 MG T: 5 | 90 days supply | Qty: 90 | Fill #0

## 2020-07-30 ENCOUNTER — Other Ambulatory Visit: Payer: Self-pay | Admitting: Thoracic Surgery (Cardiothoracic Vascular Surgery)

## 2020-07-30 ENCOUNTER — Ambulatory Visit
Admission: RE | Admit: 2020-07-30 | Discharge: 2020-07-30 | Disposition: A | Discharge status: Home / Self Care | Payer: 59 | Source: Ambulatory Visit | Attending: Thoracic Surgery (Cardiothoracic Vascular Surgery) | Admitting: Thoracic Surgery (Cardiothoracic Vascular Surgery)

## 2020-07-30 ENCOUNTER — Other Ambulatory Visit: Payer: Self-pay

## 2020-07-30 ENCOUNTER — Encounter: Payer: Self-pay | Admitting: Thoracic Surgery (Cardiothoracic Vascular Surgery)

## 2020-07-30 ENCOUNTER — Ambulatory Visit (INDEPENDENT_AMBULATORY_CARE_PROVIDER_SITE_OTHER): Payer: Self-pay | Admitting: Thoracic Surgery (Cardiothoracic Vascular Surgery)

## 2020-07-30 VITALS — BP 123/83 | HR 65 | Resp 20 | Ht 69.0 in | Wt 170.0 lb

## 2020-07-30 DIAGNOSIS — I2510 Atherosclerotic heart disease of native coronary artery without angina pectoris: Secondary | ICD-10-CM

## 2020-07-30 DIAGNOSIS — Z951 Presence of aortocoronary bypass graft: Secondary | ICD-10-CM

## 2020-07-30 DIAGNOSIS — Z09 Encounter for follow-up examination after completed treatment for conditions other than malignant neoplasm: Secondary | ICD-10-CM

## 2020-07-30 DIAGNOSIS — J90 Pleural effusion, not elsewhere classified: Secondary | ICD-10-CM | POA: Diagnosis not present

## 2020-07-30 MED ORDER — GABAPENTIN 100 MG PO CAPS
100.0000 mg | ORAL_CAPSULE | Freq: Three times a day (TID) | ORAL | 1 refills | Status: DC
Start: 2020-07-30 — End: 2020-10-25

## 2020-07-30 MED FILL — GABAPENTIN 100 MG CAPSULE: 100 | 30 days supply | Qty: 90 | Fill #0

## 2020-07-30 NOTE — Progress Notes (Addendum)
      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  Overall he is doing much better in regards to pain.  He only has some slight pain in his left chest over the LIMA harvest site, and some paresthesias over his left arm at the radial harvest site.  He is anxious to get back to work and get back to exercising.  Physical Exam: BP 123/83   Pulse 65   Resp 20   Ht 5\' 9"  (1.753 m)   Wt 170 lb (77.1 kg)   SpO2 95% Comment: RA  BMI 25.10 kg/m   Alert NAD Incision clean well-healed.   Abdomen soft, ND No peripheral edema   Diagnostic Studies & Laboratory data: CXR: Trace bilateral effusions     Assessment / Plan:   44 year old male status post CABG currently doing well. He is cleared for cardiac rehab and to resume normal activities. He will follow-up as needed. He can return to work on 09/21/20   09/23/20 07/30/2020 12:58 PM

## 2020-08-02 ENCOUNTER — Encounter: Payer: Self-pay | Admitting: Internal Medicine

## 2020-08-16 ENCOUNTER — Other Ambulatory Visit: Payer: Self-pay | Admitting: Cardiovascular Disease

## 2020-08-16 MED ORDER — AMLODIPINE BESYLATE 5 MG PO TABS: 5.0000 mg | ORAL_TABLET | Freq: Every day | ORAL | 3 refills | Status: DC

## 2020-08-16 MED FILL — AMLODIPINE BESYLATE 5 MG TA: 5 | 90 days supply | Qty: 90 | Fill #0

## 2020-08-16 NOTE — Telephone Encounter (Signed)
It seems that he is having difficulty breaking the metoprolol tablet in half and thus I recommend switching him to metoprolol succinate 25 mg once daily.

## 2020-08-16 NOTE — Telephone Encounter (Signed)
OK to restart metoprolol? Please advise.  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 He has seen Dr. Kirke Corin on 07/13/2020 in clinic.   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 He was recently discontinued on Metoprolol 12.5mg  BID on 07/13/2020 due to history of bradycardia. But recently has had more frequency of symptomatic PVCs. He has taken ASA and Lopressor 1 hr prior to the call. I agreed with this decision.   Dr. [name] endorses experiencing similar symptoms of symptomatic PVCs pre CABG. I suspect this is likely driven due to microvascular dysfunction. I recommended reinitiating the low dose lopressor.   Dr. [name] agreed to the treatment plan.   He might benefit from 14 day holter monitor, repeat Echo. I will communicate with Dr. Kirke Corin after the weekend.    Signed: Glenford Bayley 07/17/2020, 5:29 PM

## 2020-08-17 ENCOUNTER — Other Ambulatory Visit: Payer: Self-pay | Admitting: Cardiovascular Disease

## 2020-08-17 MED ORDER — METOPROLOL SUCCINATE ER 25 MG PO TB24: 25.0000 mg | ORAL_TABLET | Freq: Every day | ORAL | 6 refills | Status: DC

## 2020-08-17 MED FILL — METOPROLOL SUCCINATE ER 25: 25 | 30 days supply | Qty: 30 | Fill #0

## 2020-08-19 ENCOUNTER — Other Ambulatory Visit: Payer: Self-pay | Admitting: Physician Assistant

## 2020-08-25 DIAGNOSIS — E785 Hyperlipidemia, unspecified: Secondary | ICD-10-CM | POA: Diagnosis not present

## 2020-08-25 DIAGNOSIS — Z951 Presence of aortocoronary bypass graft: Secondary | ICD-10-CM | POA: Diagnosis not present

## 2020-08-26 LAB — NMR, LIPOPROFILE
Cholesterol, Total: 232 mg/dL — ABNORMAL HIGH (ref 100–199)
HDL Particle Number: 26.7 umol/L — ABNORMAL LOW (ref >=30.5)
HDL-C: 39 mg/dL — ABNORMAL LOW (ref >39)
LDL Particle Number: 1989 nmol/L — ABNORMAL HIGH (ref <1000)
LDL Size: 20.4 nm — ABNORMAL LOW (ref >20.5)
LDL-C (NIH Calc): 155 mg/dL — ABNORMAL HIGH (ref 0–99)
LP-IR Score: 59 — ABNORMAL HIGH (ref <=45)
Small LDL Particle Number: 1285 nmol/L — ABNORMAL HIGH (ref <=527)
Triglycerides: 207 mg/dL — ABNORMAL HIGH (ref 0–149)

## 2020-08-26 LAB — LIPOPROTEIN A (LPA): Lipoprotein (a): 225.9 nmol/L — ABNORMAL HIGH (ref <75.0)

## 2020-08-26 LAB — APOLIPOPROTEIN B: Apolipoprotein B: 159 mg/dL — ABNORMAL HIGH (ref <90)

## 2020-08-31 DIAGNOSIS — Z951 Presence of aortocoronary bypass graft: Secondary | ICD-10-CM

## 2020-08-31 DIAGNOSIS — M791 Myalgia, unspecified site: Secondary | ICD-10-CM

## 2020-08-31 DIAGNOSIS — E785 Hyperlipidemia, unspecified: Secondary | ICD-10-CM

## 2020-08-31 DIAGNOSIS — T466X5A Adverse effect of antihyperlipidemic and antiarteriosclerotic drugs, initial encounter: Secondary | ICD-10-CM

## 2020-08-31 NOTE — Telephone Encounter (Signed)
Spoke to patient about email. Stated he has had 2 episodes of chest pain,sob,dizziness.Each episode was relieved by NTG x 1.No chest pain at present.He is presently at gym working out.Advised to avoid strenuous exercise.He held his metoprolol thinking it was causing dizziness.B/P has been good.Advised he should take medications as prescribed.Appointment scheduled with Azalee Course PA 09/01/20 at 3:15 pm.Advised to bring a list of all medications and discuss at visit.

## 2020-09-01 ENCOUNTER — Ambulatory Visit: Payer: 59 | Admitting: Physician Assistant

## 2020-09-03 ENCOUNTER — Telehealth: Payer: Self-pay

## 2020-09-03 ENCOUNTER — Other Ambulatory Visit: Payer: Self-pay

## 2020-09-03 ENCOUNTER — Ambulatory Visit (INDEPENDENT_AMBULATORY_CARE_PROVIDER_SITE_OTHER): Payer: Self-pay | Admitting: Thoracic Surgery (Cardiothoracic Vascular Surgery)

## 2020-09-03 ENCOUNTER — Encounter: Payer: Self-pay | Admitting: Thoracic Surgery (Cardiothoracic Vascular Surgery)

## 2020-09-03 VITALS — BP 147/105 | HR 62 | Resp 20 | Ht 69.0 in | Wt 165.0 lb

## 2020-09-03 DIAGNOSIS — I2510 Atherosclerotic heart disease of native coronary artery without angina pectoris: Secondary | ICD-10-CM

## 2020-09-03 DIAGNOSIS — Z951 Presence of aortocoronary bypass graft: Secondary | ICD-10-CM

## 2020-09-03 NOTE — Telephone Encounter (Signed)
[employer] forms were completed and faxed to [phone]. Return to work date is 09/21/2020

## 2020-09-03 NOTE — Progress Notes (Signed)
     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  Complains of paresthesias tingling along the superior portion of his incision.  There is a small pustule that is noted to the left of his incision that is very tender to touch.  There is no erythema over this area.  Due to concern for potential stitch abscess the patient requested that incision and drainage performed.  The skin was anesthetized with 1% lidocaine and a 1 cm incision was made along this pustule.  There was no purulence evident.  I probed the incision there is no evidence of any infectious material.  I then packed with iodoform.  45 year old male status post CABG with hypertrophic scarring of his incisions, and status post an I&D of a 1 cm pustule along the left border of his sternal incision.  I made a referral to plastic surgery for assessment of his hypertrophic scar along his chest and his arm.  I have also instructed him to come back to clinic in 1 week for wound care to the incision and drainage site.  Kamali Sakata Keane Scrape

## 2020-09-07 ENCOUNTER — Encounter: Payer: Self-pay | Admitting: Cardiovascular Disease

## 2020-09-07 ENCOUNTER — Other Ambulatory Visit (HOSPITAL_COMMUNITY)
Admission: RE | Admit: 2020-09-07 | Discharge: 2020-09-07 | Disposition: A | Discharge status: Home / Self Care | Payer: 59 | Source: Ambulatory Visit | Attending: Cardiovascular Disease | Admitting: Cardiovascular Disease

## 2020-09-07 ENCOUNTER — Ambulatory Visit (INDEPENDENT_AMBULATORY_CARE_PROVIDER_SITE_OTHER): Payer: 59 | Admitting: Cardiovascular Disease

## 2020-09-07 ENCOUNTER — Other Ambulatory Visit: Payer: Self-pay

## 2020-09-07 VITALS — BP 116/72 | HR 53 | Ht 70.0 in | Wt 174.8 lb

## 2020-09-07 DIAGNOSIS — Z01812 Encounter for preprocedural laboratory examination: Secondary | ICD-10-CM | POA: Insufficient documentation

## 2020-09-07 DIAGNOSIS — E785 Hyperlipidemia, unspecified: Secondary | ICD-10-CM

## 2020-09-07 DIAGNOSIS — I10 Essential (primary) hypertension: Secondary | ICD-10-CM

## 2020-09-07 DIAGNOSIS — Z20822 Contact with and (suspected) exposure to covid-19: Secondary | ICD-10-CM | POA: Diagnosis not present

## 2020-09-07 DIAGNOSIS — I25118 Atherosclerotic heart disease of native coronary artery with other forms of angina pectoris: Secondary | ICD-10-CM | POA: Diagnosis not present

## 2020-09-07 DIAGNOSIS — R072 Precordial pain: Secondary | ICD-10-CM | POA: Diagnosis not present

## 2020-09-07 LAB — SARS CORONAVIRUS 2 (TAT 6-24 HRS): SARS Coronavirus 2: NEGATIVE

## 2020-09-07 NOTE — Patient Instructions (Signed)
Medication Instructions:   NO CHANGE  *If you need a refill on your cardiac medications before your next appointment, please call your pharmacy*  Testing/Procedures:  Your physician has requested that you have en exercise stress myoview. For further information please visit [email or web address] Please follow instruction sheet, as given.  [facility]   Follow-Up: At [facility], you and your health needs are our priority.  As part of our continuing mission to provide you with exceptional heart care, we have created designated Provider Care Teams.  These Care Teams include your primary Cardiologist (physician) and Advanced Practice Providers (APPs -  Physician Assistants and Nurse Practitioners) who all work together to provide you with the care you need, when you need it.  We recommend signing up for the patient portal called "MyChart".  Sign up information is provided on this After Visit Summary.  MyChart is used to connect with patients for Virtual Visits (Telemedicine).  Patients are able to view lab/test results, encounter notes, upcoming appointments, etc.  Non-urgent messages can be sent to your provider as well.   To learn more about what you can do with MyChart, go to [email or web address].    Your next appointment:   6 month(s)  The format for your next appointment:   In Person  Provider:   Lorine Bears MD

## 2020-09-07 NOTE — Progress Notes (Signed)
Cardiology Office Note   Date:  09/07/2020   ID:  Michael Franklin, DOB 1976-03-31, MRN [id number]  PCP:  Iran Ouch, MD  Cardiologist:   Lorine Bears, MD   No chief complaint on file.     History of Present Illness: Michael Franklin is a 45 y.o. male who presents for a follow-up visit regarding coronary artery disease status post recent CABG. He is one of our hospitalists.  He has known history of hyperlipidemia and family history of coronary artery disease.  His father had CABG in his 10s.  The patient has been very healthy throughout his life with excellent lifestyle and he plays soccer on a regular basis.  He did not tolerate atorvastatin in the past due to myalgia.  He presented in Oct 2021 with non-ST elevation myocardial infarction. EKG was unremarkable with the exception of possible septal Q waves.Echocardiogram showed an EF of 50 to 55% with mild mitral regurgitation.  Cardiac catheterization surprisingly showed severe three-vessel coronary artery disease with an occluded ostial LAD which was significantly calcified with collaterals noted from the right coronary artery, 95% stenosis in large ostial ramus and severe right PDA stenosis.  LVEDP was 24.  The patient underwent CABG by Dr. Cliffton Asters.  The patient was referred to Dr. Rennis Golden to evaluate for premature atherosclerosis and was found to have significantly elevated LP(a) elevation.  The patient has been tolerating small dose Crestor and was started on Repatha.  He had symptomatic PVCs that initially responded to treatment with Toprol but he developed bradycardia with fatigue and thus I instructed him to continue amlodipine alone. He reports intermittent episodes of chest pain and tightness that happens mostly at night and in the evening.  Surprisingly, the pain does not happen when he exercises at the gym.  He denies heartburn and the pain is not pleuritic.  It does respond to nitroglycerin.  He is planning to go back to work  at the end of the month.   Past Medical History:  Diagnosis Date  . Hyperlipidemia     Past Surgical History:  Procedure Laterality Date  . CORONARY ARTERY BYPASS GRAFT N/A 06/15/2020   Procedure: CORONARY ARTERY BYPASS GRAFTING (CABG) TIMES THREE USING HARVESTED LEFT RADIAL ARTERY, LEFT INTERNAL MAMMARY ARTERY,RIGHT GREATER SAPHENOUS VEIN,HARVESTED ENDOSCOPICALLY. Removal of balloon pump.;  Surgeon: Corliss Skains, MD;  Location: [facility] OR;  Service: Open Heart Surgery;  Laterality: N/A;  . CORONARY/GRAFT ACUTE MI REVASCULARIZATION N/A 06/14/2020   Procedure: Coronary/Graft Acute MI Revascularization;  Surgeon: Iran Ouch, MD;  Location: [facility] INVASIVE CV LAB;  Service: Cardiovascular;  Laterality: N/A;  . IABP INSERTION N/A 06/15/2020   Procedure: IABP Insertion;  Surgeon: Iran Ouch, MD;  Location: [facility] INVASIVE CV LAB;  Service: Cardiovascular;  Laterality: N/A;  . LEFT HEART CATH AND CORONARY ANGIOGRAPHY N/A 06/14/2020   Procedure: LEFT HEART CATH AND CORONARY ANGIOGRAPHY;  Surgeon: Iran Ouch, MD;  Location: [facility] INVASIVE CV LAB;  Service: Cardiovascular;  Laterality: N/A;  . TEE WITHOUT CARDIOVERSION N/A 06/15/2020   Procedure: TRANSESOPHAGEAL ECHOCARDIOGRAM (TEE);  Surgeon: Corliss Skains, MD;  Location: [facility] OR;  Service: Open Heart Surgery;  Laterality: N/A;     Current Outpatient Medications  Medication Sig Dispense Refill  . amLODipine (NORVASC) 5 MG tablet Take 1 tablet (5 mg total) by mouth daily. 90 tablet 3  . aspirin EC 325 MG EC tablet Take 1 tablet (325 mg total) by mouth daily. 30 tablet 0  . Evolocumab (REPATHA  SURECLICK) 140 MG/ML SOAJ Inject 1 Dose into the skin every 14 (fourteen) days. 2 mL 11  . gabapentin (NEURONTIN) 100 MG capsule Take 1 capsule (100 mg total) by mouth 3 (three) times daily. 90 capsule 1  . nitroGLYCERIN (NITROSTAT) 0.4 MG SL tablet Place 1 tablet (0.4 mg total) under the tongue every 5 (five) minutes as needed for  chest pain. 50 tablet 6  . rosuvastatin (CRESTOR) 5 MG tablet Take 1 tablet (5 mg total) by mouth daily. 90 tablet 3  . zolpidem (AMBIEN) 5 MG tablet Take 1 tablet (5 mg total) by mouth at bedtime as needed (Insomnia). 30 tablet 0  . metoprolol succinate (TOPROL XL) 25 MG 24 hr tablet Take 1 tablet (25 mg total) by mouth daily. (Patient not taking: Reported on 09/07/2020) 30 tablet 6   No current facility-administered medications for this visit.    Allergies:   Atorvastatin, Crestor [rosuvastatin], and Penicillins    Social History:  The patient  reports that he has never smoked. He has never used smokeless tobacco. He reports that he does not drink alcohol and does not use drugs.   Family History:  The patient's family history includes Asthma in his mother; CAD in his father; Hyperlipidemia in his father; Hypertension in his father.    ROS:  Please see the history of present illness.   Otherwise, review of systems are positive for none.   All other systems are reviewed and negative.    PHYSICAL EXAM: VS:  BP 116/72   Pulse (!) 53   Ht 5\' 10"  (1.778 m)   Wt 174 lb 12.8 oz (79.3 kg)   SpO2 99%   BMI 25.08 kg/m  , BMI Body mass index is 25.08 kg/m. GEN: Well nourished, well developed, in no acute distress  HEENT: normal  Neck: no JVD, carotid bruits, or masses Cardiac: RRR; no murmurs, rubs, or gallops,no edema  Respiratory:  clear to auscultation bilaterally, normal work of breathing GI: soft, nontender, nondistended, + BS MS: no deformity or atrophy  Skin: warm and dry, no rash Neuro:  Strength and sensation are intact Psych: euthymic mood, full affect   EKG:  EKG is ordered today. The ekg ordered today demonstrates sinus bradycardia with left atrial enlargement   Recent Labs: 06/13/2020: ALT 20; TSH 1.663 06/16/2020: Magnesium 2.0 06/18/2020: BUN 11; Creatinine, Ser 1.14; Hemoglobin 9.2; Platelets 136; Potassium 3.9; Sodium 138    Lipid Panel    Component Value  Date/Time   CHOL 193 06/14/2020 1729   TRIG 65 06/14/2020 1729   HDL 43 06/14/2020 1729   CHOLHDL 4.5 06/14/2020 1729   VLDL 13 06/14/2020 1729   LDLCALC 137 (H) 06/14/2020 1729      Wt Readings from Last 3 Encounters:  09/07/20 174 lb 12.8 oz (79.3 kg)  09/03/20 165 lb (74.8 kg)  07/30/20 170 lb (77.1 kg)       No flowsheet data found.    ASSESSMENT AND PLAN:  1.  Coronary artery disease involving native coronary arteries with other forms of angina: He is status post CABG in October but continues to have intermittent atypical chest pain surprisingly responsive to nitroglycerin.  These episodes seem to be frequent and we do have to make sure there is no significant ischemia before he resumes working.  I requested a treadmill nuclear stress test for evaluation.  2.  Hyperlipidemia: Continue treatment with small dose of simvastatin and Repatha.  3.  Essential hypertension: Continue treatment with amlodipine.  4.  PVCs: I don't think he will tolerate beta-blockers very well at due to baseline bradycardia.  I do not see evidence of PVCs today by exam or on the EKGs.   Disposition:   FU with me in 6 months  Signed,  Lorine Bears, MD  09/07/2020 8:41 AM    [facility]

## 2020-09-08 ENCOUNTER — Other Ambulatory Visit: Payer: Self-pay

## 2020-09-08 ENCOUNTER — Emergency Department (HOSPITAL_COMMUNITY): Payer: 59

## 2020-09-08 ENCOUNTER — Emergency Department (HOSPITAL_COMMUNITY)
Admission: EM | Admit: 2020-09-08 | Discharge: 2020-09-08 | Disposition: A | Discharge status: Home / Self Care | Payer: 59 | Attending: Emergency Medicine | Admitting: Emergency Medicine

## 2020-09-08 ENCOUNTER — Other Ambulatory Visit (HOSPITAL_COMMUNITY): Payer: Self-pay | Admitting: Emergency Medicine

## 2020-09-08 DIAGNOSIS — Z7982 Long term (current) use of aspirin: Secondary | ICD-10-CM | POA: Diagnosis not present

## 2020-09-08 DIAGNOSIS — Z951 Presence of aortocoronary bypass graft: Secondary | ICD-10-CM | POA: Diagnosis not present

## 2020-09-08 DIAGNOSIS — I2510 Atherosclerotic heart disease of native coronary artery without angina pectoris: Secondary | ICD-10-CM | POA: Diagnosis not present

## 2020-09-08 DIAGNOSIS — J90 Pleural effusion, not elsewhere classified: Secondary | ICD-10-CM | POA: Diagnosis not present

## 2020-09-08 DIAGNOSIS — R079 Chest pain, unspecified: Secondary | ICD-10-CM | POA: Diagnosis not present

## 2020-09-08 DIAGNOSIS — R072 Precordial pain: Secondary | ICD-10-CM | POA: Insufficient documentation

## 2020-09-08 LAB — TROPONIN I (HIGH SENSITIVITY)
Troponin I (High Sensitivity): 5 ng/L
Troponin I (High Sensitivity): 5 ng/L

## 2020-09-08 LAB — CBC
HCT: 43 % (ref 39.0–52.0)
Hemoglobin: 13.5 g/dL (ref 13.0–17.0)
MCH: 26.9 pg (ref 26.0–34.0)
MCHC: 31.4 g/dL (ref 30.0–36.0)
MCV: 85.7 fL (ref 80.0–100.0)
Platelets: 276 10*3/uL (ref 150–400)
RBC: 5.02 MIL/uL (ref 4.22–5.81)
RDW: 13.2 % (ref 11.5–15.5)
WBC: 5.7 10*3/uL (ref 4.0–10.5)
nRBC: 0 % (ref 0.0–0.2)

## 2020-09-08 LAB — BASIC METABOLIC PANEL WITH GFR
Anion gap: 11 (ref 5–15)
BUN: 10 mg/dL (ref 6–20)
CO2: 24 mmol/L (ref 22–32)
Calcium: 9.2 mg/dL (ref 8.9–10.3)
Chloride: 105 mmol/L (ref 98–111)
Creatinine, Ser: 1.16 mg/dL (ref 0.61–1.24)
GFR, Estimated: >60 mL/min
Glucose, Bld: 111 mg/dL — ABNORMAL HIGH (ref 70–99)
Potassium: 4.3 mmol/L (ref 3.5–5.1)
Sodium: 140 mmol/L (ref 135–145)

## 2020-09-08 MED ORDER — AMLODIPINE BESYLATE 5 MG PO TABS
5.0000 mg | ORAL_TABLET | Freq: Once | ORAL | Status: AC
  Administered 2020-09-08: 5 mg via ORAL
  Filled 2020-09-08: qty 1

## 2020-09-08 MED ORDER — ZOLPIDEM TARTRATE 5 MG PO TABS: 5.0000 mg | ORAL_TABLET | Freq: Every evening | ORAL | 0 refills | Status: DC | PRN

## 2020-09-08 MED ORDER — OMEPRAZOLE 20 MG PO CPDR: 20.0000 mg | DELAYED_RELEASE_CAPSULE | Freq: Every day | ORAL | 0 refills | Status: DC

## 2020-09-08 MED ORDER — SUCRALFATE 1 GM/10ML PO SUSP: 1.0000 g | Freq: Three times a day (TID) | ORAL | 0 refills | Status: DC

## 2020-09-08 MED FILL — OMEPRAZOLE 20 MG CAP: 20 | 30 days supply | Qty: 30 | Fill #0

## 2020-09-08 MED FILL — ZOLPIDEM TARTRATE 5 MG TAB: 5 | 30 days supply | Qty: 30 | Fill #0

## 2020-09-08 NOTE — ED Triage Notes (Signed)
Pt with left sided chest pain radiating L shoulder onset last night. Took one nitro last night and one this morning, both with mild relief. CABG in October.

## 2020-09-08 NOTE — ED Provider Notes (Signed)
[place] EMERGENCY DEPARTMENT Provider Note  CSN: [id number] Arrival date & time: 09/08/20 1105    History Chief Complaint  Patient presents with  . Chest Pain    HPI  Michael Franklin is a 45 y.o. male with history of CAD requiring CABG in Oct 2021 has had intermittent episodes of left sided chest pain, typically resolves with NTG but not necessarily associated with exertion. Pain radiates into LUE. He was at his Cardiologist's office yesterday and was scheduled for nuclear stress for later this week however he was having chest pain most of the night and came to the ED this morning for evaluation. He took a NTG before coming in and pain is minimal at the time of my evaluation    Past Medical History:  Diagnosis Date  . Hyperlipidemia     Past Surgical History:  Procedure Laterality Date  . CORONARY ARTERY BYPASS GRAFT N/A 06/15/2020   Procedure: CORONARY ARTERY BYPASS GRAFTING (CABG) TIMES THREE USING HARVESTED LEFT RADIAL ARTERY, LEFT INTERNAL MAMMARY ARTERY,RIGHT GREATER SAPHENOUS VEIN,HARVESTED ENDOSCOPICALLY. Removal of balloon pump.;  Surgeon: Corliss Skains, MD;  Location: [facility] OR;  Service: Open Heart Surgery;  Laterality: N/A;  . CORONARY/GRAFT ACUTE MI REVASCULARIZATION N/A 06/14/2020   Procedure: Coronary/Graft Acute MI Revascularization;  Surgeon: Iran Ouch, MD;  Location: [facility] INVASIVE CV LAB;  Service: Cardiovascular;  Laterality: N/A;  . IABP INSERTION N/A 06/15/2020   Procedure: IABP Insertion;  Surgeon: Iran Ouch, MD;  Location: [facility] INVASIVE CV LAB;  Service: Cardiovascular;  Laterality: N/A;  . LEFT HEART CATH AND CORONARY ANGIOGRAPHY N/A 06/14/2020   Procedure: LEFT HEART CATH AND CORONARY ANGIOGRAPHY;  Surgeon: Iran Ouch, MD;  Location: [facility] INVASIVE CV LAB;  Service: Cardiovascular;  Laterality: N/A;  . TEE WITHOUT CARDIOVERSION N/A 06/15/2020   Procedure: TRANSESOPHAGEAL ECHOCARDIOGRAM (TEE);  Surgeon: Corliss Skains, MD;   Location: [facility] OR;  Service: Open Heart Surgery;  Laterality: N/A;    Family History  Problem Relation Age of Onset  . Asthma Mother   . Hypertension Father   . CAD Father   . Hyperlipidemia Father     Social History   Tobacco Use  . Smoking status: Never Smoker  . Smokeless tobacco: Never Used  Substance Use Topics  . Alcohol use: Never  . Drug use: Never     Home Medications Prior to Admission medications   Medication Sig Start Date End Date Taking? Authorizing Provider  omeprazole (PRILOSEC) 20 MG capsule Take 1 capsule (20 mg total) by mouth daily. 09/08/20  Yes Pollyann Savoy, MD  sucralfate (CARAFATE) 1 GM/10ML suspension Take 10 mLs (1 g total) by mouth 4 (four) times daily -  with meals and at bedtime. 09/08/20  Yes Pollyann Savoy, MD  amLODipine (NORVASC) 5 MG tablet Take 1 tablet (5 mg total) by mouth daily. 08/16/20   Iran Ouch, MD  aspirin EC 325 MG EC tablet Take 1 tablet (325 mg total) by mouth daily. 06/20/20   Doree Fudge M, PA-C  Evolocumab (REPATHA SURECLICK) 140 MG/ML SOAJ Inject 1 Dose into the skin every 14 (fourteen) days. 07/28/20   Hilty, Lisette Abu, MD  gabapentin (NEURONTIN) 100 MG capsule Take 1 capsule (100 mg total) by mouth 3 (three) times daily. 07/30/20   Corliss Skains, MD  metoprolol succinate (TOPROL XL) 25 MG 24 hr tablet Take 1 tablet (25 mg total) by mouth daily. Patient not taking: Reported on 09/07/2020 08/17/20   Iran Ouch,  MD  nitroGLYCERIN (NITROSTAT) 0.4 MG SL tablet Place 1 tablet (0.4 mg total) under the tongue every 5 (five) minutes as needed for chest pain. 06/21/20   Lightfoot, Eliezer Lofts, MD  rosuvastatin (CRESTOR) 5 MG tablet Take 1 tablet (5 mg total) by mouth daily. 07/28/20 10/26/20  Hilty, Lisette Abu, MD  zolpidem (AMBIEN) 5 MG tablet Take 1 tablet (5 mg total) by mouth at bedtime as needed (Insomnia). 09/08/20   Pollyann Savoy, MD     Allergies    Atorvastatin, Crestor [rosuvastatin], and  Penicillins   Review of Systems   Review of Systems A comprehensive review of systems was completed and negative except as noted in HPI.    Physical Exam BP (!) 143/97   Pulse (!) 57   Temp 98.1 F (36.7 C)   Resp 20   SpO2 99%   Physical Exam Vitals and nursing note reviewed.  Constitutional:      Appearance: Normal appearance.  HENT:     Head: Normocephalic and atraumatic.     Nose: Nose normal.     Mouth/Throat:     Mouth: Mucous membranes are moist.  Eyes:     Extraocular Movements: Extraocular movements intact.     Conjunctiva/sclera: Conjunctivae normal.  Cardiovascular:     Rate and Rhythm: Normal rate.  Pulmonary:     Effort: Pulmonary effort is normal.     Breath sounds: Normal breath sounds.  Abdominal:     General: Abdomen is flat.     Palpations: Abdomen is soft.     Tenderness: There is no abdominal tenderness.  Musculoskeletal:        General: No swelling. Normal range of motion.     Cervical back: Neck supple.  Skin:    General: Skin is warm and dry.  Neurological:     General: No focal deficit present.     Mental Status: He is alert.  Psychiatric:        Mood and Affect: Mood normal.      ED Results / Procedures / Treatments   Labs (all labs ordered are listed, but only abnormal results are displayed) Labs Reviewed  BASIC METABOLIC PANEL - Abnormal; Notable for the following components:      Result Value   Glucose, Bld 111 (*)    All other components within normal limits  CBC  TROPONIN I (HIGH SENSITIVITY)  TROPONIN I (HIGH SENSITIVITY)    EKG EKG Interpretation  Date/Time:  Wednesday September 08 2020 14:53:10 EST Ventricular Rate:  52 PR Interval:  140 QRS Duration: 95 QT Interval:  468 QTC Calculation: 436 R Axis:   110 Text Interpretation: Sinus rhythm Probable left atrial enlargement Right axis deviation No significant change since last tracing Confirmed by Susy Frizzle [id number]) on 09/08/2020 3:03:29 PM   Radiology DG  Chest 2 View  Result Date: 09/08/2020 CLINICAL DATA:  Chest pain. EXAM: CHEST - 2 VIEW COMPARISON:  July 30, 2020. FINDINGS: The heart size and mediastinal contours are within normal limits. Prior CABG. Both lungs are clear. No visible pneumothorax. Small right greater than left pleural effusions, similar to prior. No acute osseous abnormality. IMPRESSION: Small right greater than left pleural effusions, similar to prior. Otherwise, no evidence of acute cardiopulmonary disease. Electronically Signed   By: Feliberto Harts MD   On: 09/08/2020 11:44    Procedures Procedures  Medications Ordered in the ED Medications  amLODipine (NORVASC) tablet 5 mg (5 mg Oral Given 09/08/20 1336)  MDM Rules/Calculators/A&P MDM EKG without ischemic changes, first Trop is normal. Will discuss with cardiology regarding proceeding with stress today vs holding off until Friday.   ED Course  I have reviewed the triage vital signs and the nursing notes.  Pertinent labs & imaging results that were available during my care of the patient were reviewed by me and considered in my medical decision making (see chart for details).  Clinical Course as of 09/08/20 1528  Wed Sep 08, 2020  1453 Spoke with Cardiology, they are not able to arrange nuclear stress test today. Patient's second troponin is also neg. He would like one final EKG but is otherwise comfortable going home for outpatient stress test scheduled in 2 days.  [CS]    Clinical Course User Index [CS] Pollyann Savoy, MD    Final Clinical Impression(s) / ED Diagnoses Final diagnoses:  Precordial pain    Rx / DC Orders ED Discharge Orders         Ordered    zolpidem (AMBIEN) 5 MG tablet  At bedtime PRN,   Status:  Discontinued        09/08/20 1502    omeprazole (PRILOSEC) 20 MG capsule  Daily        09/08/20 1502    sucralfate (CARAFATE) 1 GM/10ML suspension  3 times daily with meals & bedtime        09/08/20 1502    zolpidem (AMBIEN) 5  MG tablet  At bedtime PRN        09/08/20 1504           Pollyann Savoy, MD 09/08/20 1528

## 2020-09-09 ENCOUNTER — Telehealth (HOSPITAL_COMMUNITY): Payer: Self-pay | Admitting: *Deleted

## 2020-09-09 ENCOUNTER — Ambulatory Visit (INDEPENDENT_AMBULATORY_CARE_PROVIDER_SITE_OTHER): Payer: Self-pay | Admitting: Thoracic Surgery (Cardiothoracic Vascular Surgery)

## 2020-09-09 ENCOUNTER — Encounter: Payer: Self-pay | Admitting: Thoracic Surgery (Cardiothoracic Vascular Surgery)

## 2020-09-09 VITALS — BP 122/79 | HR 79 | Resp 20 | Ht 69.0 in | Wt 165.0 lb

## 2020-09-09 DIAGNOSIS — Z09 Encounter for follow-up examination after completed treatment for conditions other than malignant neoplasm: Secondary | ICD-10-CM

## 2020-09-09 DIAGNOSIS — Z951 Presence of aortocoronary bypass graft: Secondary | ICD-10-CM

## 2020-09-09 NOTE — Progress Notes (Signed)
     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  It is healing quite nicely and will require is covered with a Band-Aid.  Yesterday he presented to the emergency department with chest pain that was not relieved with the nitroglycerin.Michael Franklin  His troponins and EKGs were all negative.  He is scheduled for a stress test tomorrow.  He continues to remain very active and is able to run on the treadmill and is working out on a regular basis.  He has been on the Norvasc which I advised him that he should be on chronically given the radial artery harvest.  It is possible that he may be having some vasospasm although his symptoms are not consistent with that given his rigorous activity.  I will follow-up with a stress test. His wound is well-healed and he can follow-up as needed.  Ragan Duhon Keane Scrape

## 2020-09-09 NOTE — Telephone Encounter (Signed)
Close encounter 

## 2020-09-10 ENCOUNTER — Ambulatory Visit (HOSPITAL_COMMUNITY)
Admission: RE | Admit: 2020-09-10 | Discharge: 2020-09-10 | Disposition: A | Discharge status: Home / Self Care | Payer: 59 | Source: Ambulatory Visit | Attending: Cardiovascular Disease | Admitting: Cardiovascular Disease

## 2020-09-10 ENCOUNTER — Other Ambulatory Visit: Payer: Self-pay

## 2020-09-10 DIAGNOSIS — R072 Precordial pain: Secondary | ICD-10-CM | POA: Diagnosis not present

## 2020-09-10 LAB — MYOCARDIAL PERFUSION IMAGING
Estimated workload: 13.7 METS
Exercise duration (min): 13 min
Exercise duration (sec): 0 s
LV dias vol: 156 mL (ref 62–150)
LV sys vol: 79 mL
MPHR: 174 {beats}/min
Peak HR: 153 {beats}/min
Percent HR: 87 %
Rest HR: 56 {beats}/min
SDS: 0
SRS: 1
SSS: 1
TID: 0.94

## 2020-09-10 MED ORDER — TECHNETIUM TC 99M TETROFOSMIN IV KIT
9.7000 | PACK | Freq: Once | INTRAVENOUS | Status: AC | PRN
  Administered 2020-09-10: 9.7 via INTRAVENOUS
  Filled 2020-09-10: qty 10

## 2020-09-10 MED ORDER — TECHNETIUM TC 99M TETROFOSMIN IV KIT
30.1000 | PACK | Freq: Once | INTRAVENOUS | Status: AC | PRN
  Administered 2020-09-10: 30.1 via INTRAVENOUS
  Filled 2020-09-10: qty 31

## 2020-09-15 ENCOUNTER — Ambulatory Visit (INDEPENDENT_AMBULATORY_CARE_PROVIDER_SITE_OTHER): Payer: 59 | Admitting: Plastic Surgery

## 2020-09-15 ENCOUNTER — Encounter: Payer: Self-pay | Admitting: Plastic Surgery

## 2020-09-15 ENCOUNTER — Other Ambulatory Visit: Payer: Self-pay

## 2020-09-15 VITALS — BP 127/86 | HR 70 | Ht 69.0 in | Wt 173.8 lb

## 2020-09-15 DIAGNOSIS — L910 Hypertrophic scar: Secondary | ICD-10-CM | POA: Diagnosis not present

## 2020-09-15 NOTE — Progress Notes (Signed)
Referring Provider Iran Ouch, MD [phone] [address] [place],  [place] [address]   CC:  Chief Complaint  Patient presents with  . Advice Only      Michael Franklin is an 45 y.o. male.  HPI: Patient presents for evaluation of scars in his central chest and left forearm.  He recently had an unexpected finding of cardiovascular disease and underwent a three-vessel CABG.  He has a history of either hypertrophic scars or keloiding and is bothered by some prominent scars in his central chest and left forearm.  They are painful and sensitive and he believes they are getting larger with time.  Allergies  Allergen Reactions  . Atorvastatin     myalgias  . Crestor [Rosuvastatin]     higher doses cause myalgias  . Penicillins     Outpatient Encounter Medications as of 09/15/2020  Medication Sig Note  . amLODipine (NORVASC) 5 MG tablet Take 1 tablet (5 mg total) by mouth daily.   Marland Kitchen aspirin EC 325 MG EC tablet Take 1 tablet (325 mg total) by mouth daily.   . Evolocumab (REPATHA SURECLICK) 140 MG/ML SOAJ Inject 1 Dose into the skin every 14 (fourteen) days. 07/30/2020: Will start this RX on 08/28/2020  . gabapentin (NEURONTIN) 100 MG capsule Take 1 capsule (100 mg total) by mouth 3 (three) times daily.   . metoprolol succinate (TOPROL XL) 25 MG 24 hr tablet Take 1 tablet (25 mg total) by mouth daily.   . nitroGLYCERIN (NITROSTAT) 0.4 MG SL tablet Place 1 tablet (0.4 mg total) under the tongue every 5 (five) minutes as needed for chest pain.   Marland Kitchen omeprazole (PRILOSEC) 20 MG capsule Take 1 capsule (20 mg total) by mouth daily.   . rosuvastatin (CRESTOR) 5 MG tablet Take 1 tablet (5 mg total) by mouth daily. 07/30/2020: Will start this RX 08/28/2020   . sucralfate (CARAFATE) 1 GM/10ML suspension Take 10 mLs (1 g total) by mouth 4 (four) times daily -  with meals and at bedtime.   Marland Kitchen zolpidem (AMBIEN) 5 MG tablet Take 1 tablet (5 mg total) by mouth at bedtime as needed (Insomnia).    No  facility-administered encounter medications on file as of 09/15/2020.     Past Medical History:  Diagnosis Date  . Hyperlipidemia     Past Surgical History:  Procedure Laterality Date  . CORONARY ARTERY BYPASS GRAFT N/A 06/15/2020   Procedure: CORONARY ARTERY BYPASS GRAFTING (CABG) TIMES THREE USING HARVESTED LEFT RADIAL ARTERY, LEFT INTERNAL MAMMARY ARTERY,RIGHT GREATER SAPHENOUS VEIN,HARVESTED ENDOSCOPICALLY. Removal of balloon pump.;  Surgeon: Corliss Skains, MD;  Location: [facility] OR;  Service: Open Heart Surgery;  Laterality: N/A;  . CORONARY/GRAFT ACUTE MI REVASCULARIZATION N/A 06/14/2020   Procedure: Coronary/Graft Acute MI Revascularization;  Surgeon: Iran Ouch, MD;  Location: [facility] INVASIVE CV LAB;  Service: Cardiovascular;  Laterality: N/A;  . IABP INSERTION N/A 06/15/2020   Procedure: IABP Insertion;  Surgeon: Iran Ouch, MD;  Location: [facility] INVASIVE CV LAB;  Service: Cardiovascular;  Laterality: N/A;  . LEFT HEART CATH AND CORONARY ANGIOGRAPHY N/A 06/14/2020   Procedure: LEFT HEART CATH AND CORONARY ANGIOGRAPHY;  Surgeon: Iran Ouch, MD;  Location: [facility] INVASIVE CV LAB;  Service: Cardiovascular;  Laterality: N/A;  . TEE WITHOUT CARDIOVERSION N/A 06/15/2020   Procedure: TRANSESOPHAGEAL ECHOCARDIOGRAM (TEE);  Surgeon: Corliss Skains, MD;  Location: [facility] OR;  Service: Open Heart Surgery;  Laterality: N/A;    Family History  Problem Relation Age of  Onset  . Asthma Mother   . Hypertension Father   . CAD Father   . Hyperlipidemia Father     Social History   Social History Narrative  . Not on file     Review of Systems General: Denies fevers, chills, weight loss CV: Denies chest pain, shortness of breath, palpitations  Physical Exam Vitals with BMI 09/15/2020 09/10/2020 09/09/2020  Height 5\' 9"  5\' 10"  5\' 9"   Weight 173 lbs 13 oz 174 lbs 165 lbs  BMI 25.65 24.97 24.36  Systolic 127 - 122  Diastolic 86 - 79  Pulse 70 - 79  Some encounter  information is confidential and restricted. Go to Review Flowsheets activity to see all data.    General:  No acute distress,  Alert and oriented, Non-Toxic, Normal speech and affect Examination shows a longitudinal scar in the mid chest from his sternotomy.  At the bottom it looks to be particularly raised and firm.  It is still within the borders of the scar however. Examination of the left forearm shows a longitudinal scar from the wrist crease up towards the elbow.  The most symptomatic area is distally at the level of the wrist.  There is some firmness and raised nature to the entire length of the scar.  Assessment/Plan Patient presents with at least hypertrophic scars and possibly keloids that are 3 months out from surgery.  We discussed various alternatives including silicone sheeting and steroid injections.  He is tried silicone sheets in the past with little success for other scars.  He is interested in steroid injections of the most symptomatic areas which are at the distal aspect of the forearm Garro in the inferior aspect of the chest scar.  We discussed the risks and benefits and alternatives and elected to proceed.  Both areas were prepped with a alcohol pad and a mixture of half Kenalog 40 and half lidocaine with epinephrine was injected into each location.  About 2.5 cc of the solution solution was utilized.  He is content to follow-up as needed at this point and we will see him again depending on how things go.  09/15/2020, 1:08 PM

## 2020-09-22 MED FILL — REPATHA SURECLICK 140 MG/ML: 140 | 84 days supply | Qty: 6 | Fill #1

## 2020-09-24 ENCOUNTER — Telehealth: Payer: 59 | Admitting: Internal Medicine

## 2020-10-16 ENCOUNTER — Telehealth: Payer: Self-pay | Admitting: Internal Medicine

## 2020-10-16 NOTE — Telephone Encounter (Signed)
Patient called on call pager for advice on chest pain. Has intermittently had angina since CABG and has seen Dr. Cliffton Asters and Dr. Kirke Corin, with nuclear stress test showing no areas of ischemia and estimated EF of 49%. He has taken amlodipine and half of his metoprolol tab and inquires for further advice.   Recommend sublingual nitroglycerin for chest pain. He recently had ED eval for chest pain with rule out (negative trops and ECG) for ischemia. Chest pain as same as that incident. Wonders if we can manage as outpatient. Gave red flag ER precautions for chest pain and ED presentation. He will try nitroglycerin for chest pain and understands recommendations. Will follow up with patient later this afternoon regarding symptoms and further recommendations.

## 2020-10-19 ENCOUNTER — Ambulatory Visit: Payer: 59 | Admitting: Cardiovascular Disease

## 2020-10-20 DIAGNOSIS — T466X5A Adverse effect of antihyperlipidemic and antiarteriosclerotic drugs, initial encounter: Secondary | ICD-10-CM | POA: Diagnosis not present

## 2020-10-20 DIAGNOSIS — Z951 Presence of aortocoronary bypass graft: Secondary | ICD-10-CM | POA: Diagnosis not present

## 2020-10-20 DIAGNOSIS — E785 Hyperlipidemia, unspecified: Secondary | ICD-10-CM | POA: Diagnosis not present

## 2020-10-20 DIAGNOSIS — M791 Myalgia, unspecified site: Secondary | ICD-10-CM | POA: Diagnosis not present

## 2020-10-21 ENCOUNTER — Ambulatory Visit: Payer: 59 | Admitting: Internal Medicine

## 2020-10-22 LAB — LIPOPROTEIN A (LPA): Lipoprotein (a): 198.6 nmol/L — ABNORMAL HIGH (ref <75.0)

## 2020-10-22 LAB — NMR, LIPOPROFILE
Cholesterol, Total: 98 mg/dL — ABNORMAL LOW (ref 100–199)
HDL Particle Number: 36.5 umol/L (ref >=30.5)
HDL-C: 50 mg/dL (ref >39)
LDL Particle Number: <300 nmol/L (ref <1000)
LDL-C (NIH Calc): 30 mg/dL (ref 0–99)
LP-IR Score: 25 (ref <=45)
Small LDL Particle Number: 92 nmol/L (ref <=527)
Triglycerides: 92 mg/dL (ref 0–149)

## 2020-10-25 ENCOUNTER — Encounter: Payer: Self-pay | Admitting: Internal Medicine

## 2020-10-25 ENCOUNTER — Other Ambulatory Visit: Payer: Self-pay

## 2020-10-25 ENCOUNTER — Ambulatory Visit (INDEPENDENT_AMBULATORY_CARE_PROVIDER_SITE_OTHER): Payer: 59 | Admitting: Internal Medicine

## 2020-10-25 VITALS — BP 121/74 | HR 67 | Ht 69.0 in | Wt 167.8 lb

## 2020-10-25 DIAGNOSIS — E785 Hyperlipidemia, unspecified: Secondary | ICD-10-CM

## 2020-10-25 DIAGNOSIS — M791 Myalgia, unspecified site: Secondary | ICD-10-CM | POA: Diagnosis not present

## 2020-10-25 DIAGNOSIS — E7841 Elevated Lipoprotein(a): Secondary | ICD-10-CM | POA: Diagnosis not present

## 2020-10-25 DIAGNOSIS — T466X5A Adverse effect of antihyperlipidemic and antiarteriosclerotic drugs, initial encounter: Secondary | ICD-10-CM | POA: Diagnosis not present

## 2020-10-25 DIAGNOSIS — Z951 Presence of aortocoronary bypass graft: Secondary | ICD-10-CM | POA: Diagnosis not present

## 2020-10-25 NOTE — Patient Instructions (Signed)
Medication Instructions:  Your physician recommends that you continue on your current medications as directed. Please refer to the Current Medication list given to you today.  *If you need a refill on your cardiac medications before your next appointment, please call your pharmacy*   Lab Work: FASTING lipid panel in 6 months to check cholesterol   If you have labs (blood work) drawn today and your tests are completely normal, you will receive your results only by: Marland Kitchen MyChart Message (if you have MyChart) OR . A paper copy in the mail If you have any lab test that is abnormal or we need to change your treatment, we will call you to review the results.   Testing/Procedures: NONE   Follow-Up: At [facility], you and your health needs are our priority.  As part of our continuing mission to provide you with exceptional heart care, we have created designated Provider Care Teams.  These Care Teams include your primary Cardiologist (physician) and Advanced Practice Providers (APPs -  Physician Assistants and Nurse Practitioners) who all work together to provide you with the care you need, when you need it.  We recommend signing up for the patient portal called "MyChart".  Sign up information is provided on this After Visit Summary.  MyChart is used to connect with patients for Virtual Visits (Telemedicine).  Patients are able to view lab/test results, encounter notes, upcoming appointments, etc.  Non-urgent messages can be sent to your provider as well.   To learn more about what you can do with MyChart, go to [email or web address].    Your next appointment:   12 month(s) - lipid clinic  The format for your next appointment:   In Person  Provider:   K. [place] Hilty, MD   Other Instructions

## 2020-10-25 NOTE — Progress Notes (Signed)
LIPID CLINIC CONSULT NOTE  Chief Complaint:  Follow-up dyslipidemia  Primary Care Physician: Iran Ouch, MD  Primary Cardiologist:  Lorine Bears, MD  HPI:  Michael Franklin is a 45 y.o. male who is being seen today for the evaluation of dyslipidemia at the request of Iran Ouch, MD. 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  He does have a family history of heart disease in his father who had CABG at 43 and is a retired Development worker, community, and it sounds like some remote heart disease in the family.  He recently had lipid testing which showed a trend toward rising LDL cholesterol.  A lipid profile 1 month ago showed total cholesterol 225, triglycerides 96, HDL 45 and LDL of 161.  Subsequently he was started on atorvastatin 20 mg daily but had significant myalgias with this and was unable to play sports.  He was actually playing a soccer match and after finishing noted that he had some substernal chest tightness.  That eventually resolved but the day after he had some exertional chest discomfort that did not abate.  He then presented for evaluation and was noted to have ST segment depression on his EKG and ultimately admitted for cardiac catheterization.  This demonstrated 3 vessel coronary artery disease with an occluded ostial LAD, collaterals and a decreased LVEF of 50 to 55%.  Based on these findings he was referred for urgent coronary bypass and had balloon pump insertion.  He received a LIMA to LAD, free radial graft to the DS and reverse saphenous vein graft to the PDA.  Since that time he has been recovering at home and has follow-up with Dr. Cliffton Asters next week.  He hopes to be cleared to start cardiac rehabilitation and back to work soon.  In addition to physical activity reports a healthy diet without significant fats or fried foods.  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  He underwent repeat lipid testing now on Repatha and low-dose rosuvastatin.  A marked reduction in his cholesterol.  His LDL-P has decreased from 1989 to less than 300 particles detectable.  LDL-C is down to 30 from 155.  HDL-C as up to 50 from 39 and triglycerides are down to 92 from 207.  Most impressively, his small LDL particle number is down from 1285 to 92.  Overall, a marked improvement in his lipid profile across the board.  Additionally, his LP(a) even is decreased from 226 down to 199.  He feels well on the medication without any detectable side effects.  PMHx:  Past Medical History:  Diagnosis Date  . Hyperlipidemia     Past Surgical History:  Procedure Laterality Date  . CORONARY ARTERY BYPASS GRAFT N/A 06/15/2020   Procedure: CORONARY ARTERY BYPASS GRAFTING (CABG) TIMES THREE USING HARVESTED LEFT RADIAL ARTERY, LEFT INTERNAL MAMMARY ARTERY,RIGHT GREATER SAPHENOUS VEIN,HARVESTED ENDOSCOPICALLY. Removal of balloon pump.;  Surgeon: Corliss Skains, MD;  Location: [facility] OR;  Service: Open Heart Surgery;  Laterality: N/A;  . CORONARY/GRAFT ACUTE MI REVASCULARIZATION N/A 06/14/2020   Procedure: Coronary/Graft Acute MI Revascularization;  Surgeon: Iran Ouch, MD;  Location: [facility] INVASIVE CV LAB;  Service: Cardiovascular;  Laterality: N/A;  . IABP INSERTION N/A 06/15/2020   Procedure: IABP Insertion;  Surgeon: Iran Ouch, MD;  Location: [facility] INVASIVE CV LAB;  Service: Cardiovascular;  Laterality: N/A;  . LEFT HEART CATH AND CORONARY ANGIOGRAPHY N/A 06/14/2020  Procedure: LEFT HEART CATH AND CORONARY ANGIOGRAPHY;  Surgeon: Iran Ouch, MD;  Location: [facility] INVASIVE CV LAB;  Service: Cardiovascular;  Laterality: N/A;  . TEE WITHOUT CARDIOVERSION N/A 06/15/2020   Procedure: TRANSESOPHAGEAL ECHOCARDIOGRAM (TEE);  Surgeon: Corliss Skains, MD;  Location: [facility] OR;  Service: Open Heart Surgery;  Laterality: N/A;    FAMHx:  Family History  Problem  Relation Age of Onset  . Asthma Mother   . Hypertension Father   . CAD Father   . Hyperlipidemia Father     SOCHx:   reports that he has never smoked. He has never used smokeless tobacco. He reports that he does not drink alcohol and does not use drugs.  ALLERGIES:  Allergies  Allergen Reactions  . Atorvastatin     myalgias  . Crestor [Rosuvastatin]     higher doses cause myalgias  . Penicillins     ROS: Pertinent items noted in HPI and remainder of comprehensive ROS otherwise negative.  HOME MEDS: Current Outpatient Medications on File Prior to Visit  Medication Sig Dispense Refill  . amLODipine (NORVASC) 5 MG tablet Take 1 tablet (5 mg total) by mouth daily. 90 tablet 3  . aspirin EC 325 MG EC tablet Take 1 tablet (325 mg total) by mouth daily. 30 tablet 0  . Evolocumab (REPATHA SURECLICK) 140 MG/ML SOAJ Inject 1 Dose into the skin every 14 (fourteen) days. 2 mL 11  . metoprolol succinate (TOPROL XL) 25 MG 24 hr tablet Take 1 tablet (25 mg total) by mouth daily. 30 tablet 6  . nitroGLYCERIN (NITROSTAT) 0.4 MG SL tablet Place 1 tablet (0.4 mg total) under the tongue every 5 (five) minutes as needed for chest pain. 50 tablet 6  . omeprazole (PRILOSEC) 20 MG capsule Take 1 capsule (20 mg total) by mouth daily. 30 capsule 0  . rosuvastatin (CRESTOR) 5 MG tablet Take 1 tablet (5 mg total) by mouth daily. 90 tablet 3  . zolpidem (AMBIEN) 5 MG tablet Take 1 tablet (5 mg total) by mouth at bedtime as needed (Insomnia). 30 tablet 0   No current facility-administered medications on file prior to visit.    LABS/IMAGING: No results found for this or any previous visit (from the past 48 hour(s)). No results found.  LIPID PANEL:    Component Value Date/Time   CHOL 193 06/14/2020 1729   TRIG 65 06/14/2020 1729   HDL 43 06/14/2020 1729   CHOLHDL 4.5 06/14/2020 1729   VLDL 13 06/14/2020 1729   LDLCALC 137 (H) 06/14/2020 1729    WEIGHTS: Wt Readings from Last 3 Encounters:   10/25/20 167 lb 12.8 oz (76.1 kg)  09/15/20 173 lb 12.8 oz (78.8 kg)  09/10/20 174 lb (78.9 kg)    VITALS: BP 121/74   Pulse 67   Ht 5\' 9"  (1.753 m)   Wt 167 lb 12.8 oz (76.1 kg)   SpO2 96%   BMI 24.78 kg/m   EXAM: General appearance: alert and no distress Lungs: clear to auscultation bilaterally Heart: regular rate and rhythm, S1, S2 normal, no murmur, click, rub or gallop Extremities: extremities normal, atraumatic, no cyanosis or edema Neurologic: Grossly normal  EKG: Deferred  ASSESSMENT: 1. Multivessel coronary artery disease status post CABG x3 (LIMA to LAD, free radial to ramus intermedius and reverse saphenous vein graft to PDA)-05/2020 2. Mixed dyslipidemia, goal LDL less than 70 3. Family history of coronary artery disease in his father 55. Essential hypertension  PLAN: 1.   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  He has had some improvement in his LP(a) as well.  He is a very good candidate for the upcoming [employer] LP(a) trial and I will refer him.  He understands that this is a randomization to placebo or drug, but it is considered additive therapy to current treatments.  Will plan repeat lipids in 6 months and follow-up with me in 1 year.  Chrystie Nose, MD, [facility], FACP  [place]  [facility]  Medical Director of the Advanced Lipid Disorders &  Cardiovascular Risk Reduction Clinic Diplomate of the American Board of Clinical Lipidology Attending Cardiologist  Direct Dial: [phone]  Fax: [phone]  Website:  [email or web address] 10/25/2020, 7:56 PM

## 2020-10-30 ENCOUNTER — Other Ambulatory Visit: Payer: Self-pay

## 2020-10-30 ENCOUNTER — Emergency Department (HOSPITAL_COMMUNITY): Payer: 59

## 2020-10-30 ENCOUNTER — Emergency Department (HOSPITAL_COMMUNITY)
Admission: EM | Admit: 2020-10-30 | Discharge: 2020-10-30 | Disposition: A | Discharge status: Home / Self Care | Payer: 59 | Attending: Emergency Medicine | Admitting: Emergency Medicine

## 2020-10-30 ENCOUNTER — Encounter (HOSPITAL_COMMUNITY): Payer: Self-pay

## 2020-10-30 DIAGNOSIS — J90 Pleural effusion, not elsewhere classified: Secondary | ICD-10-CM | POA: Diagnosis not present

## 2020-10-30 DIAGNOSIS — Z7982 Long term (current) use of aspirin: Secondary | ICD-10-CM | POA: Insufficient documentation

## 2020-10-30 DIAGNOSIS — R0789 Other chest pain: Secondary | ICD-10-CM | POA: Insufficient documentation

## 2020-10-30 DIAGNOSIS — I25118 Atherosclerotic heart disease of native coronary artery with other forms of angina pectoris: Secondary | ICD-10-CM | POA: Diagnosis not present

## 2020-10-30 DIAGNOSIS — R072 Precordial pain: Secondary | ICD-10-CM

## 2020-10-30 DIAGNOSIS — Z951 Presence of aortocoronary bypass graft: Secondary | ICD-10-CM | POA: Diagnosis not present

## 2020-10-30 DIAGNOSIS — R110 Nausea: Secondary | ICD-10-CM | POA: Diagnosis not present

## 2020-10-30 DIAGNOSIS — I493 Ventricular premature depolarization: Secondary | ICD-10-CM

## 2020-10-30 DIAGNOSIS — E785 Hyperlipidemia, unspecified: Secondary | ICD-10-CM

## 2020-10-30 DIAGNOSIS — R079 Chest pain, unspecified: Secondary | ICD-10-CM | POA: Diagnosis not present

## 2020-10-30 DIAGNOSIS — I2510 Atherosclerotic heart disease of native coronary artery without angina pectoris: Secondary | ICD-10-CM | POA: Insufficient documentation

## 2020-10-30 HISTORY — DX: Non-ST elevation (NSTEMI) myocardial infarction: I21.4

## 2020-10-30 LAB — CBC
HCT: 41.5 % (ref 39.0–52.0)
Hemoglobin: 13.5 g/dL (ref 13.0–17.0)
MCH: 26.9 pg (ref 26.0–34.0)
MCHC: 32.5 g/dL (ref 30.0–36.0)
MCV: 82.7 fL (ref 80.0–100.0)
Platelets: 263 10*3/uL (ref 150–400)
RBC: 5.02 MIL/uL (ref 4.22–5.81)
RDW: 14.1 % (ref 11.5–15.5)
WBC: 7.1 10*3/uL (ref 4.0–10.5)
nRBC: 0 % (ref 0.0–0.2)

## 2020-10-30 LAB — TROPONIN I (HIGH SENSITIVITY)
Troponin I (High Sensitivity): 3 ng/L (ref <18)
Troponin I (High Sensitivity): 3 ng/L (ref <18)

## 2020-10-30 LAB — BASIC METABOLIC PANEL
Anion gap: 8 (ref 5–15)
BUN: 15 mg/dL (ref 6–20)
CO2: 26 mmol/L (ref 22–32)
Calcium: 9.4 mg/dL (ref 8.9–10.3)
Chloride: 104 mmol/L (ref 98–111)
Creatinine, Ser: 1.15 mg/dL (ref 0.61–1.24)
GFR, Estimated: >60 mL/min (ref >60)
Glucose, Bld: 105 mg/dL — ABNORMAL HIGH (ref 70–99)
Potassium: 5.1 mmol/L (ref 3.5–5.1)
Sodium: 138 mmol/L (ref 135–145)

## 2020-10-30 MED ORDER — AMLODIPINE BESYLATE 5 MG PO TABS
5.0000 mg | ORAL_TABLET | Freq: Once | ORAL | Status: AC
  Administered 2020-10-30: 5 mg via ORAL
  Filled 2020-10-30: qty 1

## 2020-10-30 MED ORDER — ONDANSETRON HCL 4 MG/2ML IJ SOLN
4.0000 mg | Freq: Once | INTRAMUSCULAR | Status: AC
  Administered 2020-10-30: 4 mg via INTRAVENOUS
  Filled 2020-10-30: qty 2

## 2020-10-30 NOTE — Consult Note (Addendum)
Cardiology Consultation:   Patient ID: Michael Franklin; [id number]; [birth year]   Admit date: 10/30/2020 Date of Consult: 10/30/2020  Primary Care Provider: Iran Ouch, MD  Patient Profile:   Michael Franklin is a 45 y.o. male with a hx of CAD with NSTEMI s/p CABG x3 05/2020, HLD, and family hx of CAD who is being seen today for the evaluation of chest pain at the request of Dr. Renaye Rakers.  History of Present Illness:   [name] is a 45yo M with a hx as stated above who presented to [facility] -  10/30/20 with chest pain that woke him from sleep around 0400. He states that he took a SL NTG with some relief.  He attempted to go back to sleep however the pain persisted. He took two additional nitro tablets and was on his way to work when he decided to come to the ED for further evaluation. He reports prior to his cath he never had chest pain. His chest pain began after CABG. As below, he has been evaluated on several occasions for recurrent angina which is not exertional. Stress test completed 09/10/20 that showed normal perfusion. He did have EKG changes with exercise felt to be secondary to small vessel disease but still considered a low risk study.   In the ED, HsT were found to be negative at 5>>5>>3 with stable EKG. All other labs look stable on review. CXR with no acute finding and trace trivial pleural effusion.   He is followed by Dr. Kirke Corin for his cardiology care. He initially presented 05/2020 with NSTEMI. EKG with NSR with septal Q waves. Echocardiogram showed an EF at 50-55% with mild regurgitation. LHC performed showed severe three-vessel CAD with an occluded ostial LAD which was significantly calcified with collaterals noted from the right coronary artery, 95% stenosis in large ostial ramus and severe right PDA stenosis. He subsequently underwent CABG by Dr. Cliffton Asters. He was referred to the lipid clinic for significant elevated LDL and premature atherosclerosis. He was started on Repatha  and is followed closely in the OP lipid clinic. He has issues in the past with PVCs that responded to Toprol  However he developed subsequent bradycardia. He was last seen by Dr. Kirke Corin 09/07/20 with reports of intermittent chest pain and tightness which would occur mostly at night. Pain was not occurring with exercise and was not responsive to SL NTG. Plan was to pursue a treadmill nuclear stress test.   Unfortunately he presented to the ED 09/08/20 with chest pain. EKG was stable with negative HsT. He was subsequently discharged with cardiology follow up.   Past Medical History:  Diagnosis Date  . Hyperlipidemia   . NSTEMI (non-ST elevated myocardial infarction) [facility])     Past Surgical History:  Procedure Laterality Date  . CORONARY ARTERY BYPASS GRAFT N/A 06/15/2020   Procedure: CORONARY ARTERY BYPASS GRAFTING (CABG) TIMES THREE USING HARVESTED LEFT RADIAL ARTERY, LEFT INTERNAL MAMMARY ARTERY,RIGHT GREATER SAPHENOUS VEIN,HARVESTED ENDOSCOPICALLY. Removal of balloon pump.;  Surgeon: Corliss Skains, MD;  Location: [facility] OR;  Service: Open Heart Surgery;  Laterality: N/A;  . CORONARY/GRAFT ACUTE MI REVASCULARIZATION N/A 06/14/2020   Procedure: Coronary/Graft Acute MI Revascularization;  Surgeon: Iran Ouch, MD;  Location: [facility] INVASIVE CV LAB;  Service: Cardiovascular;  Laterality: N/A;  . IABP INSERTION N/A 06/15/2020   Procedure: IABP Insertion;  Surgeon: Iran Ouch, MD;  Location: [facility] INVASIVE CV LAB;  Service: Cardiovascular;  Laterality: N/A;  . LEFT HEART CATH AND CORONARY ANGIOGRAPHY N/A 06/14/2020  Procedure: LEFT HEART CATH AND CORONARY ANGIOGRAPHY;  Surgeon: Iran Ouch, MD;  Location: [facility] INVASIVE CV LAB;  Service: Cardiovascular;  Laterality: N/A;  . TEE WITHOUT CARDIOVERSION N/A 06/15/2020   Procedure: TRANSESOPHAGEAL ECHOCARDIOGRAM (TEE);  Surgeon: Corliss Skains, MD;  Location: [facility] OR;  Service: Open Heart Surgery;  Laterality: N/A;     Prior to  Admission medications   Medication Sig Start Date End Date Taking? Authorizing Provider  amLODipine (NORVASC) 5 MG tablet Take 1 tablet (5 mg total) by mouth daily. 08/16/20  Yes Iran Ouch, MD  aspirin EC 325 MG EC tablet Take 1 tablet (325 mg total) by mouth daily. 06/20/20  Yes Doree Fudge M, PA-C  nitroGLYCERIN (NITROSTAT) 0.4 MG SL tablet Place 1 tablet (0.4 mg total) under the tongue every 5 (five) minutes as needed for chest pain. 06/21/20  Yes Lightfoot, Eliezer Lofts, MD  omeprazole (PRILOSEC) 20 MG capsule Take 1 capsule (20 mg total) by mouth daily. Patient taking differently: Take 20 mg by mouth as needed (Acid Reflux). 09/08/20  Yes Pollyann Savoy, MD  rosuvastatin (CRESTOR) 5 MG tablet Take 1 tablet (5 mg total) by mouth daily. 07/28/20 10/26/20 Yes Hilty, Lisette Abu, MD  zolpidem (AMBIEN) 5 MG tablet Take 1 tablet (5 mg total) by mouth at bedtime as needed (Insomnia). 09/08/20  Yes Pollyann Savoy, MD  Evolocumab (REPATHA SURECLICK) 140 MG/ML SOAJ Inject 1 Dose into the skin every 14 (fourteen) days. 07/28/20   Hilty, Lisette Abu, MD  metoprolol succinate (TOPROL XL) 25 MG 24 hr tablet Take 1 tablet (25 mg total) by mouth daily. Patient not taking: Reported on 10/30/2020 08/17/20   Iran Ouch, MD    Inpatient Medications: Scheduled Meds:  Continuous Infusions:  PRN Meds:   Allergies:    Allergies  Allergen Reactions  . Atorvastatin     myalgias  . Crestor [Rosuvastatin]     higher doses cause myalgias  . Penicillins     Social History:   Social History   Socioeconomic History  . Marital status: Married    Spouse name: Not on file  . Number of children: Not on file  . Years of education: Not on file  . Highest education level: Not on file  Occupational History  . Not on file  Tobacco Use  . Smoking status: Never Smoker  . Smokeless tobacco: Never Used  Substance and Sexual Activity  . Alcohol use: Never  . Drug use: Never  . Sexual  activity: Not on file  Other Topics Concern  . Not on file  Social History Narrative  . Not on file   Social Determinants of Health   Financial Resource Strain: Not on file  Food Insecurity: Not on file  Transportation Needs: Not on file  Physical Activity: Not on file  Stress: Not on file  Social Connections: Not on file  Intimate Partner Violence: Not on file    Family History:   Family History  Problem Relation Age of Onset  . Asthma Mother   . Hypertension Father   . CAD Father   . Hyperlipidemia Father    Family Status:  Family Status  Relation Name Status  . Mother  (Not Specified)  . Father  (Not Specified)    ROS:  Please see the history of present illness.  All other ROS reviewed and negative.     Physical Exam/Data:   Vitals:   10/30/20 4268 10/30/20 0715 10/30/20 0730 10/30/20 0745  BP:  127/89 125/90 116/80 114/80  Pulse: (!) 57 (!) 50 (!) 52 (!) 52  Resp: 18 16 17 15   Temp:      TempSrc:      SpO2: 99% 99% 98% 99%  Weight:      Height:       No intake or output data in the 24 hours ending 10/30/20 0824 Filed Weights   10/30/20 0630  Weight: 77.1 kg   Body mass index is 25.1 kg/m.   General: Well developed, well nourished, NAD Neck: Negative for carotid bruits. No JVD Lungs:Clear to ausculation bilaterally. No wheezes, rales, or rhonchi. Breathing is unlabored. Cardiovascular: RRR with S1 S2. No murmurs Abdomen: Soft, non-tender, non-distended MSK: Strength and tone appear normal for age. 5/5 in all extremities Extremities: No edema. No clubbing or cyanosis. DP/PT pulses 2+ bilaterally Neuro: Alert and oriented. No focal deficits. No facial asymmetry. MAE spontaneously. Psych: Responds to questions appropriately with normal affect.     EKG:  The EKG was personally reviewed and demonstrates:  10/30/20 SB with HR 51bpm, and no acute ST/T wave changes  Telemetry:  Telemetry was personally reviewed and demonstrates:  10/30/20 SB with HRs in the  50's   Relevant CV Studies:  Stress test 09/10/20:    The left ventricular ejection fraction is mildly decreased (45-54%).  Nuclear stress EF: 49%.  Blood pressure demonstrated a normal response to exercise.  Horizontal ST segment depression ST segment depression of 2 mm was noted during stress in the II, III, aVF, V5, V6 and V4 leads, and returning to baseline after 1-5 minutes of recovery.  This is a low risk study.   Normal pefusion. LVEF 49% with septal dyskinesis (post-operative). Electrically abnormal treadmill test with up to 2 mm horizontal ST depression at peak exercise in II, III, AVF, V4-V6, however, exercise tolerance is excellent at 13.7 METS. Overall a low risk study. No prior for comparison.  LHC 06/14/21:   Ost LAD to Prox LAD lesion is 100% stenosed.  Ramus lesion is 95% stenosed.  Ost Cx to Prox Cx lesion is 30% stenosed.  Mid Cx lesion is 30% stenosed.  Prox RCA to Mid RCA lesion is 30% stenosed.  RPDA-1 lesion is 90% stenosed.  RPDA-2 lesion is 80% stenosed.  There is mild left ventricular systolic dysfunction.  LV end diastolic pressure is moderately elevated.  The left ventricular ejection fraction is 45-50% by visual estimate.   1.  Severe three-vessel coronary artery disease.  Occluded ostial LAD with reasonable collaterals noted from RCA, 95% stenosis in large ostial ramus and severe right PDA disease. 2.  Mildly reduced LV systolic function with an EF of 50% with mild distal anterior and apical hypokinesis. 3.  Moderately elevated left ventricular end-diastolic pressure at 24 mmHg.  Recommendations: I suspect that the patient occluded his LAD last week on Thursday when he had prolonged chest pain.  It is possible that he had a pre-existing stenosis leading to preformed collaterals before the occlusion.  In spite of occluded LAD, ejection fraction is almost well-preserved.  Given ostial location of the LAD occlusion and ostial disease in the  ramus, PCI is not suitable.  Recommend evaluation for CABG. Resume heparin 8 hours after sheath pull. No beta-blocker for now given bradycardia. The patient appears to be stable and we can plan to transfer the [facility] tomorrow a.m.  Laboratory Data:  Chemistry Recent Labs  Lab 10/30/20 0639  NA 138  K 5.1  CL 104  CO2 26  GLUCOSE 105*  BUN 15  CREATININE 1.15  CALCIUM 9.4  GFRNONAA >60  ANIONGAP 8    Total Protein  Date Value Ref Range Status  06/13/2020 7.6 6.5 - 8.1 g/dL Final   Albumin  Date Value Ref Range Status  06/13/2020 4.6 3.5 - 5.0 g/dL Final   AST  Date Value Ref Range Status  06/13/2020 22 15 - 41 U/L Final   ALT  Date Value Ref Range Status  06/13/2020 20 0 - 44 U/L Final   Alkaline Phosphatase  Date Value Ref Range Status  06/13/2020 63 38 - 126 U/L Final   Total Bilirubin  Date Value Ref Range Status  06/13/2020 0.9 0.3 - 1.2 mg/dL Final   Hematology Recent Labs  Lab 10/30/20 0639  WBC 7.1  RBC 5.02  HGB 13.5  HCT 41.5  MCV 82.7  MCH 26.9  MCHC 32.5  RDW 14.1  PLT 263   Cardiac EnzymesNo results for input(s): TROPONINI in the last 168 hours. No results for input(s): TROPIPOC in the last 168 hours.  BNPNo results for input(s): BNP, PROBNP in the last 168 hours.  DDimer No results for input(s): DDIMER in the last 168 hours. TSH:  Lab Results  Component Value Date   TSH 1.663 06/13/2020   Lipids: Lab Results  Component Value Date   CHOL 193 06/14/2020   HDL 43 06/14/2020   LDLCALC 137 (H) 06/14/2020   TRIG 65 06/14/2020   CHOLHDL 4.5 06/14/2020   HgbA1c: Lab Results  Component Value Date   HGBA1C 5.7 (H) 06/15/2020    Radiology/Studies:  DG Chest 2 View  Result Date: 10/30/2020 CLINICAL DATA:  Chest pain.  History of CABG. EXAM: CHEST - 2 VIEW COMPARISON:  09/08/2020 FINDINGS: Trace right pleural effusion, also seen previously. The lungs are clear. No pneumothorax. Normal heart size and  mediastinal contours. CABG IMPRESSION: No acute finding. Trace right pleural effusion or scarring. Electronically Signed   By: Marnee Spring M.D.   On: 10/30/2020 06:59   Assessment and Plan:   1. Chest pain with hx of CAD s/p CABGx3 05/2020: -Pt underwent CABGx3 10/21 with recurrent chest pain on follow up. He underwent OP stress testing which showed no ischemia or infarct>>performed for recurrent angina. EKG did showed some ST depression with peak exercise however returned to baseline after 1-5 minutes of recovery. There was excellent exercise tolerance. He then presented to [facility] with recurrent chest pain that persisted despite SL NTG.  -HsT negative at 5>5>3>3 with stable EKG -He is currently chest pain free. Contacted Dr. Kirke Corin personally who requested to arrange LHC with him 11/05/20 at [facility]. Will send a message to clinic staff for finalization.  -Continue with amlodipine given radial artery harvest, ASA>>dose dicussed with Dr. Kirke Corin with plans to continue -Continue Repatha and Crestor  -Offered Imdur to be added to regimen however patient deferred  2. HLD: -Last LDL, 137 from 10.18.2021 -Follows OP with lipid clinic, last seen 10/25/20 -Continue statin, Repatha   3. Essential HTN: -Stable, 121/92>>114/80>>116/80 -Continue amlodipine   4. PVC: -Hx of symtpomatic PVCs>>previously noted to have been taken off beta blockers due to subsequent bradycardia however it appears that Torpol remains on home medication list?  -Reports that he is not currently taking Toprol   5. Bradycardia: -Asymptomatic with HR in the 50's -Not currently on beat blocker therapy   For questions or updates, please contact [facility] Please consult [email or web address] for contact info under Cardiology/STEMI.   Signed,  Georgie ChardJill McDaniel NP-C [facility] Pager: [phone] 10/30/2020 8:24 AM   I have seen and examined the patient along with Georgie ChardJill McDaniel NP-C.  I have reviewed the chart, notes and new data.  I  agree with NP's note.  Key new complaints: chest pain was significant in severity, but very different from his pre-CABG angina (exertional midsternal burning) and failed to improve with NTG. Also different from his reflux symptoms. Key examination changes: normal exam, well healed sternotomy and radial harvest sites Key new findings / data: ECG during pain is normal. Normal hs-troponin x 2. Normal CXR except "Trace right pleural effusion or scarring". Normal myocardial perfusion scan (for same symptoms) in January.  PLAN: No evidence for acute coronary sd.  He prefers outpatient workup, which is reasonable. Outpatient cardiac cath w Dr. Kirke CorinArida next week, but suspect non-coronary pain, maybe a post-pericardiotomy sd.  Thurmon FairMihai Lachele Lievanos, MD, [facility]ville[facility]ACC [facility] [phone] 10/30/2020, 9:57 AM

## 2020-10-30 NOTE — ED Triage Notes (Signed)
Pt reports that he is having chest pain, sob, and pain that is now radiating to his left arm. Had a triple bypass Oct.  2021. Took Nitro SL x3 and ASA 325 PO.

## 2020-10-30 NOTE — Discharge Instructions (Addendum)
Follow up with your cardiologist as discussed.

## 2020-10-30 NOTE — H&P (View-Only) (Signed)
Cardiology Consultation:   Patient ID: Michael Franklin; [id number]; [birth year]   Admit date: 10/30/2020 Date of Consult: 10/30/2020  Primary Care Provider: Iran Ouch, MD  Patient Profile:   Michael Franklin is a 45 y.o. male with a hx of CAD with NSTEMI s/p CABG x3 05/2020, HLD, and family hx of CAD who is being seen today for the evaluation of chest pain at the request of Dr. Renaye Franklin.  History of Present Illness:   Michael Franklin is a 45yo M with a hx as stated above who presented to [facility] -  10/30/20 with chest pain that woke him from sleep around 0400. He states that he took a SL NTG with some relief.  He attempted to go back to sleep however the pain persisted. He took two additional nitro tablets and was on his way to work when he decided to come to the ED for further evaluation. He reports prior to his cath he never had chest pain. His chest pain began after CABG. As below, he has been evaluated on several occasions for recurrent angina which is not exertional. Stress test completed 09/10/20 that showed normal perfusion. He did have EKG changes with exercise felt to be secondary to small vessel disease but still considered a low risk study.   In the ED, HsT were found to be negative at 5>>5>>3 with stable EKG. All other labs look stable on review. CXR with no acute finding and trace trivial pleural effusion.   He is followed by Dr. Kirke Franklin for his cardiology care. He initially presented 05/2020 with NSTEMI. EKG with NSR with septal Q waves. Echocardiogram showed an EF at 50-55% with mild regurgitation. LHC performed showed severe three-vessel CAD with an occluded ostial LAD which was significantly calcified with collaterals noted from the right coronary artery, 95% stenosis in large ostial ramus and severe right PDA stenosis. He subsequently underwent CABG by Dr. Cliffton Franklin. He was referred to the lipid clinic for significant elevated LDL and premature atherosclerosis. He was started on Repatha  and is followed closely in the OP lipid clinic. He has issues in the past with PVCs that responded to Toprol  However he developed subsequent bradycardia. He was last seen by Dr. Kirke Franklin 09/07/20 with reports of intermittent chest pain and tightness which would occur mostly at night. Pain was not occurring with exercise and was not responsive to SL NTG. Plan was to pursue a treadmill nuclear stress test.   Unfortunately he presented to the ED 09/08/20 with chest pain. EKG was stable with negative HsT. He was subsequently discharged with cardiology follow up.   Past Medical History:  Diagnosis Date  . Hyperlipidemia   . NSTEMI (non-ST elevated myocardial infarction) [facility])     Past Surgical History:  Procedure Laterality Date  . CORONARY ARTERY BYPASS GRAFT N/A 06/15/2020   Procedure: CORONARY ARTERY BYPASS GRAFTING (CABG) TIMES THREE USING HARVESTED LEFT RADIAL ARTERY, LEFT INTERNAL MAMMARY ARTERY,RIGHT GREATER SAPHENOUS VEIN,HARVESTED ENDOSCOPICALLY. Removal of balloon pump.;  Surgeon: Michael Skains, MD;  Location: [facility] OR;  Service: Open Heart Surgery;  Laterality: N/A;  . CORONARY/GRAFT ACUTE MI REVASCULARIZATION N/A 06/14/2020   Procedure: Coronary/Graft Acute MI Revascularization;  Surgeon: Michael Ouch, MD;  Location: [facility] INVASIVE CV LAB;  Service: Cardiovascular;  Laterality: N/A;  . IABP INSERTION N/A 06/15/2020   Procedure: IABP Insertion;  Surgeon: Michael Ouch, MD;  Location: [facility] INVASIVE CV LAB;  Service: Cardiovascular;  Laterality: N/A;  . LEFT HEART CATH AND CORONARY ANGIOGRAPHY N/A 06/14/2020  Procedure: LEFT HEART CATH AND CORONARY ANGIOGRAPHY;  Surgeon: Michael Ouch, MD;  Location: [facility] INVASIVE CV LAB;  Service: Cardiovascular;  Laterality: N/A;  . TEE WITHOUT CARDIOVERSION N/A 06/15/2020   Procedure: TRANSESOPHAGEAL ECHOCARDIOGRAM (TEE);  Surgeon: Michael Skains, MD;  Location: [facility] OR;  Service: Open Heart Surgery;  Laterality: N/A;     Prior to  Admission medications   Medication Sig Start Date End Date Taking? Authorizing Provider  amLODipine (NORVASC) 5 MG tablet Take 1 tablet (5 mg total) by mouth daily. 08/16/20  Yes Michael Ouch, MD  aspirin EC 325 MG EC tablet Take 1 tablet (325 mg total) by mouth daily. 06/20/20  Yes Doree Fudge M, PA-C  nitroGLYCERIN (NITROSTAT) 0.4 MG SL tablet Place 1 tablet (0.4 mg total) under the tongue every 5 (five) minutes as needed for chest pain. 06/21/20  Yes Lightfoot, Eliezer Lofts, MD  omeprazole (PRILOSEC) 20 MG capsule Take 1 capsule (20 mg total) by mouth daily. Patient taking differently: Take 20 mg by mouth as needed (Acid Reflux). 09/08/20  Yes Pollyann Savoy, MD  rosuvastatin (CRESTOR) 5 MG tablet Take 1 tablet (5 mg total) by mouth daily. 07/28/20 10/26/20 Yes Hilty, Lisette Abu, MD  zolpidem (AMBIEN) 5 MG tablet Take 1 tablet (5 mg total) by mouth at bedtime as needed (Insomnia). 09/08/20  Yes Pollyann Savoy, MD  Evolocumab (REPATHA SURECLICK) 140 MG/ML SOAJ Inject 1 Dose into the skin every 14 (fourteen) days. 07/28/20   Hilty, Lisette Abu, MD  metoprolol succinate (TOPROL XL) 25 MG 24 hr tablet Take 1 tablet (25 mg total) by mouth daily. Patient not taking: Reported on 10/30/2020 08/17/20   Michael Ouch, MD    Inpatient Medications: Scheduled Meds:  Continuous Infusions:  PRN Meds:   Allergies:    Allergies  Allergen Reactions  . Atorvastatin     myalgias  . Crestor [Rosuvastatin]     higher doses cause myalgias  . Penicillins     Social History:   Social History   Socioeconomic History  . Marital status: Married    Spouse name: Not on file  . Number of children: Not on file  . Years of education: Not on file  . Highest education level: Not on file  Occupational History  . Not on file  Tobacco Use  . Smoking status: Never Smoker  . Smokeless tobacco: Never Used  Substance and Sexual Activity  . Alcohol use: Never  . Drug use: Never  . Sexual  activity: Not on file  Other Topics Concern  . Not on file  Social History Narrative  . Not on file   Social Determinants of Health   Financial Resource Strain: Not on file  Food Insecurity: Not on file  Transportation Needs: Not on file  Physical Activity: Not on file  Stress: Not on file  Social Connections: Not on file  Intimate Partner Violence: Not on file    Family History:   Family History  Problem Relation Age of Onset  . Asthma Mother   . Hypertension Father   . CAD Father   . Hyperlipidemia Father    Family Status:  Family Status  Relation Name Status  . Mother  (Not Specified)  . Father  (Not Specified)    ROS:  Please see the history of present illness.  All other ROS reviewed and negative.     Physical Exam/Data:   Vitals:   10/30/20 4268 10/30/20 0715 10/30/20 0730 10/30/20 0745  BP:  127/89 125/90 116/80 114/80  Pulse: (!) 57 (!) 50 (!) 52 (!) 52  Resp: 18 16 17 15   Temp:      TempSrc:      SpO2: 99% 99% 98% 99%  Weight:      Height:       No intake or output data in the 24 hours ending 10/30/20 0824 Filed Weights   10/30/20 0630  Weight: 77.1 kg   Body mass index is 25.1 kg/m.   General: Well developed, well nourished, NAD Neck: Negative for carotid bruits. No JVD Lungs:Clear to ausculation bilaterally. No wheezes, rales, or rhonchi. Breathing is unlabored. Cardiovascular: RRR with S1 S2. No murmurs Abdomen: Soft, non-tender, non-distended MSK: Strength and tone appear normal for age. 5/5 in all extremities Extremities: No edema. No clubbing or cyanosis. DP/PT pulses 2+ bilaterally Neuro: Alert and oriented. No focal deficits. No facial asymmetry. MAE spontaneously. Psych: Responds to questions appropriately with normal affect.     EKG:  The EKG was personally reviewed and demonstrates:  10/30/20 SB with HR 51bpm, and no acute ST/T wave changes  Telemetry:  Telemetry was personally reviewed and demonstrates:  10/30/20 SB with HRs in the  50's   Relevant CV Studies:  Stress test 09/10/20:    The left ventricular ejection fraction is mildly decreased (45-54%).  Nuclear stress EF: 49%.  Blood pressure demonstrated a normal response to exercise.  Horizontal ST segment depression ST segment depression of 2 mm was noted during stress in the II, III, aVF, V5, V6 and V4 leads, and returning to baseline after 1-5 minutes of recovery.  This is a low risk study.   Normal pefusion. LVEF 49% with septal dyskinesis (post-operative). Electrically abnormal treadmill test with up to 2 mm horizontal ST depression at peak exercise in II, III, AVF, V4-V6, however, exercise tolerance is excellent at 13.7 METS. Overall a low risk study. No prior for comparison.  LHC 06/14/21:   Ost LAD to Prox LAD lesion is 100% stenosed.  Ramus lesion is 95% stenosed.  Ost Cx to Prox Cx lesion is 30% stenosed.  Mid Cx lesion is 30% stenosed.  Prox RCA to Mid RCA lesion is 30% stenosed.  RPDA-1 lesion is 90% stenosed.  RPDA-2 lesion is 80% stenosed.  There is mild left ventricular systolic dysfunction.  LV end diastolic pressure is moderately elevated.  The left ventricular ejection fraction is 45-50% by visual estimate.   1.  Severe three-vessel coronary artery disease.  Occluded ostial LAD with reasonable collaterals noted from RCA, 95% stenosis in large ostial ramus and severe right PDA disease. 2.  Mildly reduced LV systolic function with an EF of 50% with mild distal anterior and apical hypokinesis. 3.  Moderately elevated left ventricular end-diastolic pressure at 24 mmHg.  Recommendations: I suspect that the patient occluded his LAD last week on Thursday when he had prolonged chest pain.  It is possible that he had a pre-existing stenosis leading to preformed collaterals before the occlusion.  In spite of occluded LAD, ejection fraction is almost well-preserved.  Given ostial location of the LAD occlusion and ostial disease in the  ramus, PCI is not suitable.  Recommend evaluation for CABG. Resume heparin 8 hours after sheath pull. No beta-blocker for now given bradycardia. The patient appears to be stable and we can plan to transfer the [facility] tomorrow a.m.  Laboratory Data:  Chemistry Recent Labs  Lab 10/30/20 0639  NA 138  K 5.1  CL 104  CO2 26  GLUCOSE 105*  BUN 15  CREATININE 1.15  CALCIUM 9.4  GFRNONAA >60  ANIONGAP 8    Total Protein  Date Value Ref Range Status  06/13/2020 7.6 6.5 - 8.1 g/dL Final   Albumin  Date Value Ref Range Status  06/13/2020 4.6 3.5 - 5.0 g/dL Final   AST  Date Value Ref Range Status  06/13/2020 22 15 - 41 U/L Final   ALT  Date Value Ref Range Status  06/13/2020 20 0 - 44 U/L Final   Alkaline Phosphatase  Date Value Ref Range Status  06/13/2020 63 38 - 126 U/L Final   Total Bilirubin  Date Value Ref Range Status  06/13/2020 0.9 0.3 - 1.2 mg/dL Final   Hematology Recent Labs  Lab 10/30/20 0639  WBC 7.1  RBC 5.02  HGB 13.5  HCT 41.5  MCV 82.7  MCH 26.9  MCHC 32.5  RDW 14.1  PLT 263   Cardiac EnzymesNo results for input(s): TROPONINI in the last 168 hours. No results for input(s): TROPIPOC in the last 168 hours.  BNPNo results for input(s): BNP, PROBNP in the last 168 hours.  DDimer No results for input(s): DDIMER in the last 168 hours. TSH:  Lab Results  Component Value Date   TSH 1.663 06/13/2020   Lipids: Lab Results  Component Value Date   CHOL 193 06/14/2020   HDL 43 06/14/2020   LDLCALC 137 (H) 06/14/2020   TRIG 65 06/14/2020   CHOLHDL 4.5 06/14/2020   HgbA1c: Lab Results  Component Value Date   HGBA1C 5.7 (H) 06/15/2020    Radiology/Studies:  DG Chest 2 View  Result Date: 10/30/2020 CLINICAL DATA:  Chest pain.  History of CABG. EXAM: CHEST - 2 VIEW COMPARISON:  09/08/2020 FINDINGS: Trace right pleural effusion, also seen previously. The lungs are clear. No pneumothorax. Normal heart size and  mediastinal contours. CABG IMPRESSION: No acute finding. Trace right pleural effusion or scarring. Electronically Signed   By: Jonathon  Watts M.D.   On: 10/30/2020 06:59   Assessment and Plan:   1. Chest pain with hx of CAD s/p CABGx3 05/2020: -Pt underwent CABGx3 10/21 with recurrent chest pain on follow up. He underwent OP stress testing which showed no ischemia or infarct>>performed for recurrent angina. EKG did showed some ST depression with peak exercise however returned to baseline after 1-5 minutes of recovery. There was excellent exercise tolerance. He then presented to [facility] with recurrent chest pain that persisted despite SL NTG.  -HsT negative at 5>5>3>3 with stable EKG -He is currently chest pain free. Contacted Dr. Arida personally who requested to arrange LHC with him 11/05/20 at [facility]. Will send a message to clinic staff for finalization.  -Continue with amlodipine given radial artery harvest, ASA>>dose dicussed with Dr. Arida with plans to continue -Continue Repatha and Crestor  -Offered Imdur to be added to regimen however patient deferred  2. HLD: -Last LDL, 137 from 10.18.2021 -Follows OP with lipid clinic, last seen 10/25/20 -Continue statin, Repatha   3. Essential HTN: -Stable, 121/92>>114/80>>116/80 -Continue amlodipine   4. PVC: -Hx of symtpomatic PVCs>>previously noted to have been taken off beta blockers due to subsequent bradycardia however it appears that Torpol remains on home medication list?  -Reports that he is not currently taking Toprol   5. Bradycardia: -Asymptomatic with HR in the 50's -Not currently on beat blocker therapy   For questions or updates, please contact [facility] Please consult [email or web address] for contact info under Cardiology/STEMI.   Signed,   Georgie ChardJill McDaniel NP-C [facility] Pager: [phone] 10/30/2020 8:24 AM   I have seen and examined the patient along with Georgie ChardJill McDaniel NP-C.  I have reviewed the chart, notes and new data.  I  agree with NP's note.  Key new complaints: chest pain was significant in severity, but very different from his pre-CABG angina (exertional midsternal burning) and failed to improve with NTG. Also different from his reflux symptoms. Key examination changes: normal exam, well healed sternotomy and radial harvest sites Key new findings / data: ECG during pain is normal. Normal hs-troponin x 2. Normal CXR except "Trace right pleural effusion or scarring". Normal myocardial perfusion scan (for same symptoms) in January.  PLAN: No evidence for acute coronary sd.  He prefers outpatient workup, which is reasonable. Outpatient cardiac cath w Dr. Kirke CorinArida next week, but suspect non-coronary pain, maybe a post-pericardiotomy sd.  Thurmon FairMihai Jeannine Pennisi, MD, [facility]ACC [facility] [phone] 10/30/2020, 9:57 AM

## 2020-10-30 NOTE — ED Provider Notes (Signed)
[facility] EMERGENCY DEPARTMENT Provider Note   CSN: [id number] Arrival date & time: 10/30/20  7106     History Chief Complaint  Patient presents with  . Chest Pain    Michael Franklin is a 45 y.o. male w/ hx of HLD, CAD c/b NSTEMI s/p CABG in Oct 2021, on aspirin 325 daily, presenting to the Ed with chest pain.  He reports onset around 0400 this morning that woke him from sleep.  Squeezing pressure in the left chest and shoulder.  He took SL Nitro x 3 with minimal relief, reporting headache now and nausea he feels is secondary to the nitro.  He took full aspirin this morning.  He states this pain feels more intense than any episodes in the past.  He denies hx of recent angina, but reports an "abnormal stress test" recently.  Cardiologist is Dr Kirke Corin, also Dr Rennis Golden CT surgeon Dr Cliffton Asters  No other medical problems.  No hx of HTN, diabetes.  Takes amlodipine 5 mg daily. Not on plavix.  HPI     Past Medical History:  Diagnosis Date  . Hyperlipidemia   . NSTEMI (non-ST elevated myocardial infarction) [facility]     Patient Active Problem List   Diagnosis Date Noted  . NSTEMI (non-ST elevated myocardial infarction) (HCC) 06/15/2020  . S/P CABG x 3 06/15/2020  . Chest pain 06/14/2020  . Non-ST elevation (NSTEMI) myocardial infarction [facility])     Past Surgical History:  Procedure Laterality Date  . CORONARY ARTERY BYPASS GRAFT N/A 06/15/2020   Procedure: CORONARY ARTERY BYPASS GRAFTING (CABG) TIMES THREE USING HARVESTED LEFT RADIAL ARTERY, LEFT INTERNAL MAMMARY ARTERY,RIGHT GREATER SAPHENOUS VEIN,HARVESTED ENDOSCOPICALLY. Removal of balloon pump.;  Surgeon: Corliss Skains, MD;  Location: [facility] OR;  Service: Open Heart Surgery;  Laterality: N/A;  . CORONARY/GRAFT ACUTE MI REVASCULARIZATION N/A 06/14/2020   Procedure: Coronary/Graft Acute MI Revascularization;  Surgeon: Iran Ouch, MD;  Location: [facility] INVASIVE CV LAB;  Service: Cardiovascular;  Laterality:  N/A;  . IABP INSERTION N/A 06/15/2020   Procedure: IABP Insertion;  Surgeon: Iran Ouch, MD;  Location: [facility] INVASIVE CV LAB;  Service: Cardiovascular;  Laterality: N/A;  . LEFT HEART CATH AND CORONARY ANGIOGRAPHY N/A 06/14/2020   Procedure: LEFT HEART CATH AND CORONARY ANGIOGRAPHY;  Surgeon: Iran Ouch, MD;  Location: [facility] INVASIVE CV LAB;  Service: Cardiovascular;  Laterality: N/A;  . TEE WITHOUT CARDIOVERSION N/A 06/15/2020   Procedure: TRANSESOPHAGEAL ECHOCARDIOGRAM (TEE);  Surgeon: Corliss Skains, MD;  Location: [facility] OR;  Service: Open Heart Surgery;  Laterality: N/A;       Family History  Problem Relation Age of Onset  . Asthma Mother   . Hypertension Father   . CAD Father   . Hyperlipidemia Father     Social History   Tobacco Use  . Smoking status: Never Smoker  . Smokeless tobacco: Never Used  Substance Use Topics  . Alcohol use: Never  . Drug use: Never    Home Medications Prior to Admission medications   Medication Sig Start Date End Date Taking? Authorizing Provider  amLODipine (NORVASC) 5 MG tablet Take 1 tablet (5 mg total) by mouth daily. 08/16/20  Yes Iran Ouch, MD  aspirin EC 325 MG EC tablet Take 1 tablet (325 mg total) by mouth daily. 06/20/20  Yes Doree Fudge M, PA-C  nitroGLYCERIN (NITROSTAT) 0.4 MG SL tablet Place 1 tablet (0.4 mg total) under the tongue every 5 (five) minutes as needed for chest pain. 06/21/20  Yes Lightfoot, Eliezer Lofts, MD  omeprazole (PRILOSEC) 20 MG capsule Take 1 capsule (20 mg total) by mouth daily. Patient taking differently: Take 20 mg by mouth as needed (Acid Reflux). 09/08/20  Yes Pollyann Savoy, MD  rosuvastatin (CRESTOR) 5 MG tablet Take 1 tablet (5 mg total) by mouth daily. 07/28/20 10/26/20 Yes Hilty, Lisette Abu, MD  zolpidem (AMBIEN) 5 MG tablet Take 1 tablet (5 mg total) by mouth at bedtime as needed (Insomnia). 09/08/20  Yes Pollyann Savoy, MD  Evolocumab (REPATHA SURECLICK) 140 MG/ML SOAJ  Inject 1 Dose into the skin every 14 (fourteen) days. 07/28/20   Hilty, Lisette Abu, MD  metoprolol succinate (TOPROL XL) 25 MG 24 hr tablet Take 1 tablet (25 mg total) by mouth daily. Patient not taking: Reported on 10/30/2020 08/17/20   Iran Ouch, MD    Allergies    Atorvastatin, Crestor [rosuvastatin], and Penicillins  Review of Systems   Review of Systems  Constitutional: Negative for chills and fever.  Respiratory: Negative for cough and shortness of breath.   Cardiovascular: Positive for chest pain. Negative for palpitations.  Gastrointestinal: Positive for nausea. Negative for abdominal pain.  Musculoskeletal: Negative for arthralgias and back pain.  Neurological: Positive for headaches. Negative for syncope.  Psychiatric/Behavioral: Negative for agitation and confusion.  All other systems reviewed and are negative.   Physical Exam Updated Vital Signs BP 110/75   Pulse (!) 50   Temp 98.5 F (36.9 C)   Resp 14   Ht 5\' 9"  (1.753 m)   Wt 77.1 kg   SpO2 97%   BMI 25.10 kg/m   Physical Exam Constitutional:      General: He is not in acute distress. HENT:     Head: Normocephalic and atraumatic.  Eyes:     Conjunctiva/sclera: Conjunctivae normal.     Pupils: Pupils are equal, round, and reactive to light.  Cardiovascular:     Rate and Rhythm: Normal rate and regular rhythm.  Pulmonary:     Effort: Pulmonary effort is normal. No respiratory distress.  Abdominal:     General: There is no distension.     Tenderness: There is no abdominal tenderness.  Skin:    General: Skin is warm and dry.  Neurological:     General: No focal deficit present.     Mental Status: He is alert. Mental status is at baseline.  Psychiatric:        Mood and Affect: Mood normal.        Behavior: Behavior normal.     ED Results / Procedures / Treatments   Labs (all labs ordered are listed, but only abnormal results are displayed) Labs Reviewed  BASIC METABOLIC PANEL - Abnormal;  Notable for the following components:      Result Value   Glucose, Bld 105 (*)    All other components within normal limits  CBC  TROPONIN I (HIGH SENSITIVITY)  TROPONIN I (HIGH SENSITIVITY)    EKG EKG Interpretation  Date/Time:  Saturday October 30 2020 07:03:11 EST Ventricular Rate:  51 PR Interval:  136 QRS Duration: 97 QT Interval:  448 QTC Calculation: 413 R Axis:   108 Text Interpretation: Sinus rhythm Probable left atrial enlargement Right axis deviation Improved inferior lead baseline, no STEMI, no sig changes from prior tracing Confirmed by 07-18-2006 [phone]) on 10/30/2020 7:07:53 AM   Radiology DG Chest 2 View  Result Date: 10/30/2020 CLINICAL DATA:  Chest pain.  History of CABG. EXAM: CHEST - 2  VIEW COMPARISON:  09/08/2020 FINDINGS: Trace right pleural effusion, also seen previously. The lungs are clear. No pneumothorax. Normal heart size and mediastinal contours. CABG IMPRESSION: No acute finding. Trace right pleural effusion or scarring. Electronically Signed   By: Marnee Spring M.D.   On: 10/30/2020 06:59     Medications Ordered in ED Medications  ondansetron (ZOFRAN) injection 4 mg (4 mg Intravenous Given 10/30/20 0712)  amLODipine (NORVASC) tablet 5 mg (5 mg Oral Given 10/30/20 0820)    ED Course  I have reviewed the triage vital signs and the nursing notes.  Pertinent labs & imaging results that were available during my care of the patient were reviewed by me and considered in my medical decision making (see chart for details).  This patient presents to the Emergency Department with complaint of chest pain. This involves an extensive number of treatment options, and is a complaint that carries with it a high risk of complications and morbidity.  The differential diagnosis includes ACS vs Coronary vasospasm vs PTX vs PNA vs gastritis vs reflux vs other  I ordered, reviewed, and interpreted labs, including BMP and CBC.  There were no immediate, life-threatening  emergencies found in this labwork.  The patient's troponin level was 3 -> 3. I ordered medication amlodipine for chest pain, 5 mg, per daily regimen. I ordered imaging studies which included dg chest I independently visualized and interpreted imaging which showed no focal infiltrate or evidence of pneumothorax  Previous records obtained and reviewed showing cardiac hx, CABG hx I personally reviewed the patients ECG which showed sinus rhythm with no acute ischemic findings  I consulted cardiology and discussed lab and imaging findings  After the interventions stated above, I reevaluated the patient and found that they remained clinically stable.    Clinical Course as of 10/30/20 1047  Sat Oct 30, 2020  0707 Repeat ECG with improved baseline inferior leads, no STEMI, shows NSR [MT]  0747 Trop 3 [MT]  0754 Spoke to Dr Rubie Maid from cardiology, who will consult on patient.  2nd troponin due at 0830.  We'll give morning amlodipine 5 mg as patient has not taken it yet.  He continues to appear well, ambulating out of room, but reports ongoing mild discomfort. [MT]  [id number] Repeat troponin 3. [MT]  5621 Patient is pain free.  Seen by cardiology team, and okay for discharge.  Patient has outpatient stress set up, and return precautions given. [MT]    Clinical Course User Index [MT] Zakee Deerman, Kermit Balo, MD    Final Clinical Impression(s) / ED Diagnoses Final diagnoses:  Chest pain, unspecified type    Rx / DC Orders ED Discharge Orders    None       Terald Sleeper, MD 10/30/20 1047

## 2020-11-01 ENCOUNTER — Telehealth: Payer: Self-pay | Admitting: *Deleted

## 2020-11-01 ENCOUNTER — Encounter: Payer: Self-pay | Admitting: *Deleted

## 2020-11-01 NOTE — Telephone Encounter (Signed)
Call placed to the patient and message sent to MyChart.      [facility] CARDIOVASCULAR DIVISION [facility] NORTHLINE [address] [place] [address] [place] [address] Dept: [phone] Loc: [phone]  Michael Franklin  11/01/2020  You are scheduled for a Cardiac Catheterization on Friday, March 11 with Dr. Lorine Bears.  1. Please arrive at the [facility] (Main Entrance A) at [facility]: [address] [place], [place] [address] at 7 am (This time is two hours before your procedure to ensure your preparation). Free valet parking service is available.   Special note: Every effort is made to have your procedure done on time. Please understand that emergencies sometimes delay scheduled procedures.  2. Diet: Do not eat solid foods after midnight.  The patient may have clear liquids until 5am upon the day of the procedure.  3. Labs:  You will need to have the coronavirus test completed prior to your procedure. An appointment has been made at 9 am on 11/03/20. This is a Drive Up Visit at [address] [place], [place], [place] [address]. Please tell them that you are there for procedure testing. Stay in your car and someone will be with you shortly. Please make sure to have all other labs completed before this test because you will need to stay quarantined until your procedure.  4. Medication instructions in preparation for your procedure: Nothing to hold  On the morning of your procedure, take your Aspirin and any morning medicines NOT listed above.  You may use sips of water.  5. Plan for one night stay--bring personal belongings. 6. Bring a current list of your medications and current insurance cards. 7. You MUST have a responsible person to drive you home. 8. Someone MUST be with you the first 24 hours after you arrive home or your discharge will be delayed. 9. Please wear clothes that are easy to get on and off and wear slip-on shoes.  Thank you  for allowing [place] to care for you!   -- [facility]

## 2020-11-01 NOTE — Telephone Encounter (Signed)
Called the patient to set up a cardiac cath per a staff message.   The patient would prefer to speak with Dr. Kirke Corin first.

## 2020-11-01 NOTE — Telephone Encounter (Signed)
I spoke with him and answered his questions.  He is good to go.  Please schedule him Friday second case.  Thanks.

## 2020-11-03 ENCOUNTER — Other Ambulatory Visit (HOSPITAL_COMMUNITY)
Admission: RE | Admit: 2020-11-03 | Discharge: 2020-11-03 | Disposition: A | Discharge status: Home / Self Care | Payer: 59 | Source: Ambulatory Visit | Attending: Cardiovascular Disease | Admitting: Cardiovascular Disease

## 2020-11-03 DIAGNOSIS — Z20822 Contact with and (suspected) exposure to covid-19: Secondary | ICD-10-CM | POA: Diagnosis not present

## 2020-11-03 DIAGNOSIS — Z01812 Encounter for preprocedural laboratory examination: Secondary | ICD-10-CM | POA: Insufficient documentation

## 2020-11-03 LAB — SARS CORONAVIRUS 2 (TAT 6-24 HRS): SARS Coronavirus 2: NEGATIVE

## 2020-11-04 ENCOUNTER — Telehealth: Payer: Self-pay | Admitting: *Deleted

## 2020-11-04 NOTE — Telephone Encounter (Addendum)
Pt contacted pre-catheterization scheduled at [facility] for: Friday November 05, 2020 9 AM Verified arrival time and place: [facility] Main Entrance A [facility]) at: 7 AM   No solid food after midnight prior to cath, clear liquids until 5 AM day of procedure.   AM meds can be  taken pre-cath with sips of water including: ASA 81 mg   Confirmed patient has responsible adult to drive home post procedure and be with patient first 24 hours after arriving home: yes  You are allowed ONE visitor in the waiting room during the time you are at the hospital for your procedure. Both you and your visitor must wear a mask once you enter the hospital.    Reviewed procedure/mask/visitor instructions with patient.

## 2020-11-05 ENCOUNTER — Ambulatory Visit (HOSPITAL_COMMUNITY)
Admission: RE | Admit: 2020-11-05 | Discharge: 2020-11-05 | Disposition: A | Discharge status: Home / Self Care | Payer: 59 | Attending: Cardiovascular Disease | Admitting: Cardiovascular Disease

## 2020-11-05 ENCOUNTER — Encounter (HOSPITAL_COMMUNITY): Payer: Self-pay | Admitting: Cardiovascular Disease

## 2020-11-05 ENCOUNTER — Encounter (HOSPITAL_COMMUNITY)
Admission: RE | Disposition: A | Discharge status: Home / Self Care | Payer: Self-pay | Source: Home / Self Care | Attending: Cardiovascular Disease

## 2020-11-05 ENCOUNTER — Other Ambulatory Visit (HOSPITAL_COMMUNITY): Payer: Self-pay | Admitting: Cardiovascular Disease

## 2020-11-05 ENCOUNTER — Other Ambulatory Visit: Payer: Self-pay

## 2020-11-05 DIAGNOSIS — I25718 Atherosclerosis of autologous vein coronary artery bypass graft(s) with other forms of angina pectoris: Secondary | ICD-10-CM

## 2020-11-05 DIAGNOSIS — Z888 Allergy status to other drugs, medicaments and biological substances status: Secondary | ICD-10-CM | POA: Insufficient documentation

## 2020-11-05 DIAGNOSIS — Z79899 Other long term (current) drug therapy: Secondary | ICD-10-CM | POA: Diagnosis not present

## 2020-11-05 DIAGNOSIS — I252 Old myocardial infarction: Secondary | ICD-10-CM | POA: Diagnosis not present

## 2020-11-05 DIAGNOSIS — I2510 Atherosclerotic heart disease of native coronary artery without angina pectoris: Secondary | ICD-10-CM

## 2020-11-05 DIAGNOSIS — Z8249 Family history of ischemic heart disease and other diseases of the circulatory system: Secondary | ICD-10-CM | POA: Diagnosis not present

## 2020-11-05 DIAGNOSIS — Z880 Allergy status to penicillin: Secondary | ICD-10-CM | POA: Insufficient documentation

## 2020-11-05 DIAGNOSIS — I25810 Atherosclerosis of coronary artery bypass graft(s) without angina pectoris: Secondary | ICD-10-CM | POA: Insufficient documentation

## 2020-11-05 DIAGNOSIS — I25118 Atherosclerotic heart disease of native coronary artery with other forms of angina pectoris: Secondary | ICD-10-CM | POA: Insufficient documentation

## 2020-11-05 DIAGNOSIS — Z7982 Long term (current) use of aspirin: Secondary | ICD-10-CM | POA: Diagnosis not present

## 2020-11-05 DIAGNOSIS — Z951 Presence of aortocoronary bypass graft: Secondary | ICD-10-CM | POA: Diagnosis not present

## 2020-11-05 HISTORY — PX: LEFT HEART CATH AND CORS/GRAFTS ANGIOGRAPHY: CATH118250

## 2020-11-05 SURGERY — LEFT HEART CATH AND CORS/GRAFTS ANGIOGRAPHY
Anesthesia: LOCAL

## 2020-11-05 MED ORDER — HEPARIN (PORCINE) IN NACL 1000-0.9 UT/500ML-% IV SOLN
INTRAVENOUS | Status: DC | PRN
  Administered 2020-11-05 (×2): 500 mL

## 2020-11-05 MED ORDER — FENTANYL CITRATE (PF) 100 MCG/2ML IJ SOLN
INTRAMUSCULAR | Status: AC
  Filled 2020-11-05: qty 2

## 2020-11-05 MED ORDER — HEPARIN (PORCINE) IN NACL 1000-0.9 UT/500ML-% IV SOLN
INTRAVENOUS | Status: AC
  Filled 2020-11-05: qty 1000

## 2020-11-05 MED ORDER — SODIUM CHLORIDE 0.9% FLUSH: 3.0000 mL | INTRAVENOUS | Status: DC | PRN

## 2020-11-05 MED ORDER — LIDOCAINE HCL (PF) 1 % IJ SOLN
INTRAMUSCULAR | Status: AC
  Filled 2020-11-05: qty 30

## 2020-11-05 MED ORDER — COLCHICINE 0.6 MG PO TABS: 0.6000 mg | ORAL_TABLET | Freq: Two times a day (BID) | ORAL | 0 refills | Status: DC

## 2020-11-05 MED ORDER — ASPIRIN 81 MG PO CHEW: 81.0000 mg | CHEWABLE_TABLET | ORAL | Status: DC

## 2020-11-05 MED ORDER — SODIUM CHLORIDE 0.9 % IV SOLN: 250.0000 mL | INTRAVENOUS | Status: DC | PRN

## 2020-11-05 MED ORDER — IOHEXOL 350 MG/ML SOLN
INTRAVENOUS | Status: DC | PRN
  Administered 2020-11-05: 95 mL

## 2020-11-05 MED ORDER — MIDAZOLAM HCL 2 MG/2ML IJ SOLN
INTRAMUSCULAR | Status: AC
  Filled 2020-11-05: qty 2

## 2020-11-05 MED ORDER — SODIUM CHLORIDE 0.9% FLUSH: 3.0000 mL | Freq: Two times a day (BID) | INTRAVENOUS | Status: DC

## 2020-11-05 MED ORDER — FENTANYL CITRATE (PF) 100 MCG/2ML IJ SOLN
INTRAMUSCULAR | Status: DC | PRN
  Administered 2020-11-05: 25 ug via INTRAVENOUS
  Administered 2020-11-05: 50 ug via INTRAVENOUS

## 2020-11-05 MED ORDER — IOHEXOL 350 MG/ML SOLN
INTRAVENOUS | Status: AC
  Filled 2020-11-05: qty 1

## 2020-11-05 MED ORDER — SODIUM CHLORIDE 0.9 % WEIGHT BASED INFUSION: 1.0000 mL/kg/h | INTRAVENOUS | Status: DC

## 2020-11-05 MED ORDER — LIDOCAINE HCL (PF) 1 % IJ SOLN
INTRAMUSCULAR | Status: DC | PRN
  Administered 2020-11-05: 12 mL

## 2020-11-05 MED ORDER — MIDAZOLAM HCL 2 MG/2ML IJ SOLN
INTRAMUSCULAR | Status: DC | PRN
  Administered 2020-11-05: 1 mg via INTRAVENOUS

## 2020-11-05 MED ORDER — SODIUM CHLORIDE 0.9 % WEIGHT BASED INFUSION
3.0000 mL/kg/h | INTRAVENOUS | Status: AC
  Administered 2020-11-05: 3 mL/kg/h via INTRAVENOUS

## 2020-11-05 MED FILL — COLCHICINE 0.6 MG TABS: 0.6 | 15 days supply | Qty: 30 | Fill #0

## 2020-11-05 SURGICAL SUPPLY — 8 items
CATH INFINITI 5FR MULTPACK ANG (CATHETERS) ×2 IMPLANT
DEVICE CLOSURE MYNXGRIP 5F (Vascular Products) ×2 IMPLANT
KIT HEART LEFT (KITS) ×2 IMPLANT
KIT MICROPUNCTURE NIT STIFF (SHEATH) ×2 IMPLANT
PACK CARDIAC CATHETERIZATION (CUSTOM PROCEDURE TRAY) ×2 IMPLANT
SHEATH PINNACLE 5F 10CM (SHEATH) ×2 IMPLANT
TRANSDUCER W/STOPCOCK (MISCELLANEOUS) ×2 IMPLANT
WIRE EMERALD 3MM-J .035X150CM (WIRE) ×2 IMPLANT

## 2020-11-05 NOTE — Interval H&P Note (Signed)
Cath Lab Visit (complete for each Cath Lab visit)  Clinical Evaluation Leading to the Procedure:   ACS: No.  Non-ACS:    Anginal Classification: CCS III  Anti-ischemic medical therapy: Maximal Therapy (2 or more classes of medications)  Non-Invasive Test Results: No non-invasive testing performed  Prior CABG: Previous CABG      History and Physical Interval Note:  11/05/2020 9:23 AM  Michael Franklin  has presented today for surgery, with the diagnosis of chest pain.  The various methods of treatment have been discussed with the patient and family. After consideration of risks, benefits and other options for treatment, the patient has consented to  Procedure(s): LEFT HEART CATH AND CORS/GRAFTS ANGIOGRAPHY (N/A) as a surgical intervention.  The patient's history has been reviewed, patient examined, no change in status, stable for surgery.  I have reviewed the patient's chart and labs.  Questions were answered to the patient's satisfaction.     Lorine Bears

## 2020-11-05 NOTE — Discharge Instructions (Signed)
Femoral Site Care This sheet gives you information about how to care for yourself after your procedure. Your health care provider may also give you more specific instructions. If you have problems or questions, contact your health care provider. What can I expect after the procedure?  After the procedure, it is common to have:  Bruising that usually fades within 1-2 weeks.  Tenderness at the site. Follow these instructions at home: Wound care 1. Follow instructions from your health care provider about how to take care of your insertion site. Make sure you: ? Wash your hands with soap and water before you change your bandage (dressing). If soap and water are not available, use hand sanitizer. ? Remove your dressing as told by your health care provider. In 24 hours 2. Do not take baths, swim, or use a hot tub until your health care provider approves. 3. You may shower 24-48 hours after the procedure or as told by your health care provider. ? Gently wash the site with plain soap and water. ? Pat the area dry with a clean towel. ? Do not rub the site. This may cause bleeding. 4. Do not apply powder or lotion to the site. Keep the site clean and dry. 5. Check your femoral site every day for signs of infection. Check for: ? Redness, swelling, or pain. ? Fluid or blood. ? Warmth. ? Pus or a bad smell. Activity 1. For the first 2-3 days after your procedure, or as long as directed: ? Avoid climbing stairs as much as possible. ? Do not squat. 2. Do not lift anything that is heavier than 10 lb (4.5 kg), or the limit that you are told, until your health care provider says that it is safe. For 5 days 3. Rest as directed. ? Avoid sitting for a long time without moving. Get up to take short walks every 1-2 hours. 4. Do not drive for 24 hours if you were given a medicine to help you relax (sedative). General instructions  Take over-the-counter and prescription medicines only as told by your  health care provider.  Keep all follow-up visits as told by your health care provider. This is important. Contact a health care provider if you have:  A fever or chills.  You have redness, swelling, or pain around your insertion site. Get help right away if:  The catheter insertion area swells very fast.  You pass out.  You suddenly start to sweat or your skin gets clammy.  The catheter insertion area is bleeding, and the bleeding does not stop when you hold steady pressure on the area.  The area near or just beyond the catheter insertion site becomes pale, cool, tingly, or numb. These symptoms may represent a serious problem that is an emergency. Do not wait to see if the symptoms will go away. Get medical help right away. Call your local emergency services (911 in the [place] Do not drive yourself to the hospital. Summary  After the procedure, it is common to have bruising that usually fades within 1-2 weeks.  Check your femoral site every day for signs of infection.  Do not lift anything that is heavier than 10 lb (4.5 kg), or the limit that you are told, until your health care provider says that it is safe. This information is not intended to replace advice given to you by your health care provider. Make sure you discuss any questions you have with your health care provider. Document Revised: 08/27/2017 Document Reviewed: 08/27/2017   Elsevier Patient Education  2020 [employer]   

## 2020-11-19 ENCOUNTER — Other Ambulatory Visit (HOSPITAL_BASED_OUTPATIENT_CLINIC_OR_DEPARTMENT_OTHER): Payer: Self-pay

## 2020-11-26 ENCOUNTER — Telehealth: Payer: Self-pay | Admitting: Cardiovascular Disease

## 2020-11-26 NOTE — Telephone Encounter (Signed)
*  STAT* If patient is at the pharmacy, call can be transferred to refill team.   1. Which medications need to be refilled? (please list name of each medication and dose if known) amLODipine (NORVASC) 5 MG tablet, zolpidem (AMBIEN) 5 MG tablet, omeprazole (PRILOSEC) 20 MG capsule  2. Which pharmacy/location (including street and city if local pharmacy) is medication to be sent to? [facility] [place], [place] - [address] [place]  3. Do they need a 30 day or 90 day supply? 90

## 2020-11-29 ENCOUNTER — Other Ambulatory Visit (HOSPITAL_COMMUNITY): Payer: Self-pay

## 2020-11-29 MED ORDER — AMLODIPINE BESYLATE 5 MG PO TABS
ORAL_TABLET | Freq: Every day | ORAL | 3 refills | Status: DC
  Filled 2020-11-29: qty 90, 90d supply, fill #0

## 2020-11-29 MED ORDER — OMEPRAZOLE 20 MG PO CPDR
DELAYED_RELEASE_CAPSULE | Freq: Every day | ORAL | 0 refills | Status: DC
  Filled 2020-11-29: qty 30, 30d supply, fill #0

## 2020-12-03 ENCOUNTER — Other Ambulatory Visit: Payer: Self-pay | Admitting: Emergency Medicine

## 2020-12-03 ENCOUNTER — Other Ambulatory Visit (HOSPITAL_COMMUNITY): Payer: Self-pay

## 2020-12-08 ENCOUNTER — Other Ambulatory Visit (HOSPITAL_COMMUNITY): Payer: Self-pay

## 2020-12-08 ENCOUNTER — Other Ambulatory Visit: Payer: Self-pay | Admitting: Emergency Medicine

## 2020-12-08 MED FILL — Evolocumab Subcutaneous Soln Auto-Injector 140 MG/ML: SUBCUTANEOUS | 30 days supply | Qty: 2 | Fill #0 | Status: AC

## 2020-12-08 MED FILL — Rosuvastatin Calcium Tab 5 MG: ORAL | 90 days supply | Qty: 90 | Fill #0 | Status: AC

## 2020-12-09 ENCOUNTER — Other Ambulatory Visit (HOSPITAL_COMMUNITY): Payer: Self-pay

## 2020-12-11 ENCOUNTER — Other Ambulatory Visit (HOSPITAL_COMMUNITY): Payer: Self-pay

## 2020-12-14 ENCOUNTER — Ambulatory Visit (INDEPENDENT_AMBULATORY_CARE_PROVIDER_SITE_OTHER): Payer: 59 | Admitting: Cardiovascular Disease

## 2020-12-14 ENCOUNTER — Encounter: Payer: Self-pay | Admitting: Cardiovascular Disease

## 2020-12-14 ENCOUNTER — Other Ambulatory Visit (HOSPITAL_COMMUNITY): Payer: Self-pay

## 2020-12-14 ENCOUNTER — Other Ambulatory Visit: Payer: Self-pay

## 2020-12-14 ENCOUNTER — Telehealth: Payer: Self-pay | Admitting: Internal Medicine

## 2020-12-14 VITALS — BP 119/78 | HR 53 | Ht 69.0 in | Wt 171.4 lb

## 2020-12-14 DIAGNOSIS — I10 Essential (primary) hypertension: Secondary | ICD-10-CM | POA: Diagnosis not present

## 2020-12-14 DIAGNOSIS — I493 Ventricular premature depolarization: Secondary | ICD-10-CM | POA: Diagnosis not present

## 2020-12-14 DIAGNOSIS — I25118 Atherosclerotic heart disease of native coronary artery with other forms of angina pectoris: Secondary | ICD-10-CM

## 2020-12-14 DIAGNOSIS — E785 Hyperlipidemia, unspecified: Secondary | ICD-10-CM

## 2020-12-14 MED ORDER — NITROGLYCERIN 0.4 MG SL SUBL
0.4000 mg | SUBLINGUAL_TABLET | SUBLINGUAL | 6 refills | Status: DC | PRN
  Filled 2020-12-14: qty 25, 5d supply, fill #0

## 2020-12-14 MED ORDER — EVOLOCUMAB 140 MG/ML ~~LOC~~ SOAJ
SUBCUTANEOUS | 11 refills | Status: DC
  Filled 2020-12-14: qty 2, 28d supply, fill #0
  Filled 2021-01-11: qty 6, 84d supply, fill #0
  Filled 2021-04-20: qty 6, 84d supply, fill #1
  Filled 2021-08-11: qty 6, 84d supply, fill #2
  Filled 2021-11-09: qty 6, 84d supply, fill #3

## 2020-12-14 MED ORDER — OMEPRAZOLE 20 MG PO CPDR
DELAYED_RELEASE_CAPSULE | Freq: Every day | ORAL | 5 refills | Status: DC
  Filled 2020-12-14: qty 30, 30d supply, fill #0

## 2020-12-14 MED ORDER — ZOLPIDEM TARTRATE 5 MG PO TABS
5.0000 mg | ORAL_TABLET | Freq: Every evening | ORAL | 4 refills | Status: DC | PRN
  Filled 2020-12-14: qty 30, 30d supply, fill #0

## 2020-12-14 MED ORDER — ZOLPIDEM TARTRATE 5 MG PO TABS: ORAL_TABLET | ORAL | 4 refills | Status: DC

## 2020-12-14 NOTE — Telephone Encounter (Signed)
*  STAT* If patient is at the pharmacy, call can be transferred to refill team.   1. Which medications need to be refilled? (please list name of each medication and dose if known)  Repatha SureClick 140 MG/ML SOAJ  2. Which pharmacy/location (including street and city if local pharmacy) is medication to be sent to? [facility]  3. Do they need a 30 day or 90 day supply? 90 day supply

## 2020-12-14 NOTE — Progress Notes (Signed)
Cardiology Office Note   Date:  12/14/2020   ID:  Michael Franklin, DOB 19-Aug-1976, MRN [id number]  PCP:  Iran Ouch, MD  Cardiologist:   Lorine Bears, MD   No chief complaint on file.     History of Present Illness: Michael Franklin is a 45 y.o. male who presents for a follow-up visit regarding coronary artery disease status post recent CABG. He is one of our hospitalists.  He has known history of hyperlipidemia and family history of coronary artery disease.  His father had CABG in his 62s.  The patient has been very healthy throughout his life with excellent lifestyle and he plays soccer on a regular basis.  He did not tolerate atorvastatin in the past due to myalgia.  He presented in Oct 2021 with non-ST elevation myocardial infarction. EKG was unremarkable with the exception of possible septal Q waves.Echocardiogram showed an EF of 50 to 55% with mild mitral regurgitation.  Cardiac catheterization surprisingly showed severe three-vessel coronary artery disease with an occluded ostial LAD which was significantly calcified with collaterals noted from the right coronary artery, 95% stenosis in large ostial ramus and severe right PDA stenosis.  LVEDP was 24.  The patient underwent CABG by Dr. Cliffton Asters.  The patient was referred to Dr. Rennis Golden to evaluate for premature atherosclerosis and was found to have significantly elevated LP(a) elevation.  The patient has been tolerating small dose Crestor and was started on Repatha.  He had symptomatic PVCs that initially responded to treatment with Toprol but he developed bradycardia with fatigue and thus I instructed him to continue amlodipine alone.  He had recurrent chest pain since CABG with some intense episodes partially responded to nitroglycerin.  Thus, I proceeded with a repeat cardiac catheterization in March which showed significant underlying three-vessel coronary artery disease with patent LIMA to LAD, patent radial to ramus and  patent SVG to right PDA.  There was moderate disease in the proximal portion of SVG to right PDA.  However, the right PDA itself was relatively supplying a small territory and the native RCA was not significantly diseased other than the right PDA.  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  He was treated with 1 week of colchicine and almost all of his symptoms resolved.  He is doing very well and resumed playing soccer. He had some issues with myalgia related to rosuvastatin.    Past Medical History:  Diagnosis Date  . Hyperlipidemia   . NSTEMI (non-ST elevated myocardial infarction) [facility])     Past Surgical History:  Procedure Laterality Date  . CORONARY ARTERY BYPASS GRAFT N/A 06/15/2020   Procedure: CORONARY ARTERY BYPASS GRAFTING (CABG) TIMES THREE USING HARVESTED LEFT RADIAL ARTERY, LEFT INTERNAL MAMMARY ARTERY,RIGHT GREATER SAPHENOUS VEIN,HARVESTED ENDOSCOPICALLY. Removal of balloon pump.;  Surgeon: Corliss Skains, MD;  Location: [facility] OR;  Service: Open Heart Surgery;  Laterality: N/A;  . CORONARY/GRAFT ACUTE MI REVASCULARIZATION N/A 06/14/2020   Procedure: Coronary/Graft Acute MI Revascularization;  Surgeon: Iran Ouch, MD;  Location: [facility] INVASIVE CV LAB;  Service: Cardiovascular;  Laterality: N/A;  . IABP INSERTION N/A 06/15/2020   Procedure: IABP Insertion;  Surgeon: Iran Ouch, MD;  Location: [facility] INVASIVE CV LAB;  Service: Cardiovascular;  Laterality: N/A;  . LEFT HEART CATH AND CORONARY ANGIOGRAPHY N/A 06/14/2020   Procedure: LEFT HEART CATH AND CORONARY ANGIOGRAPHY;  Surgeon: Iran Ouch, MD;  Location: [facility] INVASIVE CV LAB;  Service: Cardiovascular;  Laterality: N/A;  .  LEFT HEART CATH AND CORS/GRAFTS ANGIOGRAPHY N/A 11/05/2020   Procedure: LEFT HEART CATH AND CORS/GRAFTS ANGIOGRAPHY;  Surgeon: Iran Ouch, MD;  Location: [facility] INVASIVE CV LAB;  Service: Cardiovascular;  Laterality: N/A;  . TEE WITHOUT CARDIOVERSION N/A  06/15/2020   Procedure: TRANSESOPHAGEAL ECHOCARDIOGRAM (TEE);  Surgeon: Corliss Skains, MD;  Location: [facility] OR;  Service: Open Heart Surgery;  Laterality: N/A;     Current Outpatient Medications  Medication Sig Dispense Refill  . amLODipine (NORVASC) 5 MG tablet TAKE 1 TABLET BY MOUTH ONCE A DAY 90 tablet 3  . aspirin EC 325 MG EC tablet Take 1 tablet (325 mg total) by mouth daily. 30 tablet 0  . Blood Pressure Monitoring (OMRON 3 SERIES BP MONITOR) DEVI USE AS DIRECTED 1 each 0  . Evolocumab 140 MG/ML SOAJ INJECT 1 DOSE INTO THE SKIN EVERY 14 DAYS 2 mL 11  . metoprolol succinate (TOPROL-XL) 25 MG 24 hr tablet TAKE 1 TABLET (25 MG TOTAL) BY MOUTH DAILY. 30 tablet 6  . nitroGLYCERIN (NITROSTAT) 0.4 MG SL tablet Place 1 tablet (0.4 mg total) under the tongue every 5 (five) minutes as needed for chest pain. 50 tablet 6  . omeprazole (PRILOSEC) 20 MG capsule TAKE 1 CAPSULE BY MOUTH DAILY. 30 capsule 0  . rosuvastatin (CRESTOR) 5 MG tablet TAKE 1 TABLET BY MOUTH ONCE A DAY 90 tablet 3  . zolpidem (AMBIEN) 5 MG tablet TAKE 1 TABLET BY MOUTH EVERY NIGHT AT BEDTIME AS NEEDED FOR INSOMNIA 30 tablet 0  . colchicine 0.6 MG tablet TAKE 1 TABLET BY MOUTH 2 TIMES DAILY 30 tablet 0   No current facility-administered medications for this visit.    Allergies:   Atorvastatin, Crestor [rosuvastatin], and Penicillins    Social History:  The patient  reports that he has never smoked. He has never used smokeless tobacco. He reports that he does not drink alcohol and does not use drugs.   Family History:  The patient's family history includes Asthma in his mother; CAD in his father; Hyperlipidemia in his father; Hypertension in his father.    ROS:  Please see the history of present illness.   Otherwise, review of systems are positive for none.   All other systems are reviewed and negative.    PHYSICAL EXAM: VS:  BP 119/78   Pulse (!) 53   Ht 5\' 9"  (1.753 m)   Wt 171 lb 6.4 oz (77.7 kg)   SpO2 98%    BMI 25.31 kg/m  , BMI Body mass index is 25.31 kg/m. GEN: Well nourished, well developed, in no acute distress  HEENT: normal  Neck: no JVD, carotid bruits, or masses Cardiac: RRR; no murmurs, rubs, or gallops,no edema  Respiratory:  clear to auscultation bilaterally, normal work of breathing GI: soft, nontender, nondistended, + BS MS: no deformity or atrophy  Skin: warm and dry, no rash Neuro:  Strength and sensation are intact Psych: euthymic mood, full affect   EKG:  EKG is ordered today. The ekg ordered today demonstrates sinus bradycardia with left atrial enlargement   Recent Labs: 06/13/2020: ALT 20; TSH 1.663 06/16/2020: Magnesium 2.0 10/30/2020: BUN 15; Creatinine, Ser 1.15; Hemoglobin 13.5; Platelets 263; Potassium 5.1; Sodium 138    Lipid Panel    Component Value Date/Time   CHOL 193 06/14/2020 1729   TRIG 65 06/14/2020 1729   HDL 43 06/14/2020 1729   CHOLHDL 4.5 06/14/2020 1729   VLDL 13 06/14/2020 1729   LDLCALC 137 (H) 06/14/2020 1729  Wt Readings from Last 3 Encounters:  12/14/20 171 lb 6.4 oz (77.7 kg)  11/05/20 170 lb (77.1 kg)  10/30/20 170 lb (77.1 kg)       No flowsheet data found.    ASSESSMENT AND PLAN:  1.  Coronary artery disease involving native coronary arteries without angina: He is doing well overall with no anginal symptoms.  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  Cardiac catheterization last month showed patent grafts.  2.  Hyperlipidemia: Continue treatment with small dose rosuvastatin and Repatha.  3.  Essential hypertension: Continue treatment with amlodipine.  4.  PVCs: well-controlled with small dose Toprol.   Disposition:   FU with me in 6 months  Signed,  Lorine Bears, MD  12/14/2020 11:05 AM    [facility]

## 2020-12-14 NOTE — Patient Instructions (Signed)
Medication Instructions:  °No changes °*If you need a refill on your cardiac medications before your next appointment, please call your pharmacy* ° ° °Lab Work: °None ordered °If you have labs (blood work) drawn today and your tests are completely normal, you will receive your results only by: °MyChart Message (if you have MyChart) OR °A paper copy in the mail °If you have any lab test that is abnormal or we need to change your treatment, we will call you to review the results. ° ° °Testing/Procedures: °None ordered ° ° °Follow-Up: °At [facility], you and your health needs are our priority.  As part of our continuing mission to provide you with exceptional heart care, we have created designated Provider Care Teams.  These Care Teams include your primary Cardiologist (physician) and Advanced Practice Providers (APPs -  Physician Assistants and Nurse Practitioners) who all work together to provide you with the care you need, when you need it. ° °We recommend signing up for the patient portal called "MyChart".  Sign up information is provided on this After Visit Summary.  MyChart is used to connect with patients for Virtual Visits (Telemedicine).  Patients are able to view lab/test results, encounter notes, upcoming appointments, etc.  Non-urgent messages can be sent to your provider as well.   °To learn more about what you can do with MyChart, go to [email or web address].   ° °Your next appointment:   °6 month(s) ° °The format for your next appointment:   °In Person ° °Provider:   °Dr. Arida ° °

## 2020-12-15 ENCOUNTER — Other Ambulatory Visit (HOSPITAL_COMMUNITY): Payer: Self-pay

## 2020-12-15 NOTE — Addendum Note (Signed)
Addended by: Myna Hidalgo A on: 12/15/2020 03:21 PM   Modules accepted: Orders

## 2020-12-17 ENCOUNTER — Other Ambulatory Visit (HOSPITAL_COMMUNITY): Payer: Self-pay

## 2020-12-30 DIAGNOSIS — Z20822 Contact with and (suspected) exposure to covid-19: Secondary | ICD-10-CM | POA: Diagnosis not present

## 2021-01-11 ENCOUNTER — Other Ambulatory Visit (HOSPITAL_COMMUNITY): Payer: Self-pay

## 2021-01-18 ENCOUNTER — Other Ambulatory Visit (HOSPITAL_COMMUNITY): Payer: Self-pay

## 2021-02-21 ENCOUNTER — Other Ambulatory Visit: Payer: Self-pay | Admitting: Cardiovascular Disease

## 2021-02-21 ENCOUNTER — Other Ambulatory Visit: Payer: Self-pay | Admitting: *Deleted

## 2021-02-21 ENCOUNTER — Other Ambulatory Visit (HOSPITAL_COMMUNITY): Payer: Self-pay

## 2021-02-21 DIAGNOSIS — R079 Chest pain, unspecified: Secondary | ICD-10-CM

## 2021-02-21 DIAGNOSIS — J90 Pleural effusion, not elsewhere classified: Secondary | ICD-10-CM

## 2021-02-21 MED ORDER — COLCHICINE 0.6 MG PO TABS
0.6000 mg | ORAL_TABLET | Freq: Every day | ORAL | 0 refills | Status: DC
  Filled 2021-02-21: qty 30, 30d supply, fill #0

## 2021-02-21 MED ORDER — GABAPENTIN 100 MG PO CAPS
100.0000 mg | ORAL_CAPSULE | Freq: Three times a day (TID) | ORAL | 0 refills | Status: DC
  Filled 2021-02-21: qty 90, 30d supply, fill #0

## 2021-02-22 DIAGNOSIS — E785 Hyperlipidemia, unspecified: Secondary | ICD-10-CM

## 2021-03-09 ENCOUNTER — Ambulatory Visit
Admission: RE | Admit: 2021-03-09 | Discharge: 2021-03-09 | Disposition: A | Discharge status: Home / Self Care | Payer: 59 | Source: Ambulatory Visit | Attending: Cardiovascular Disease | Admitting: Cardiovascular Disease

## 2021-03-09 ENCOUNTER — Other Ambulatory Visit: Payer: Self-pay

## 2021-03-09 ENCOUNTER — Telehealth: Payer: Self-pay | Admitting: Internal Medicine

## 2021-03-09 ENCOUNTER — Other Ambulatory Visit (HOSPITAL_COMMUNITY): Payer: Self-pay

## 2021-03-09 DIAGNOSIS — E785 Hyperlipidemia, unspecified: Secondary | ICD-10-CM | POA: Diagnosis not present

## 2021-03-09 DIAGNOSIS — R079 Chest pain, unspecified: Secondary | ICD-10-CM

## 2021-03-09 DIAGNOSIS — J90 Pleural effusion, not elsewhere classified: Secondary | ICD-10-CM | POA: Diagnosis not present

## 2021-03-09 DIAGNOSIS — J9811 Atelectasis: Secondary | ICD-10-CM | POA: Diagnosis not present

## 2021-03-09 MED ORDER — AMLODIPINE BESYLATE 5 MG PO TABS
ORAL_TABLET | Freq: Every day | ORAL | 3 refills | Status: DC
  Filled 2021-03-09: qty 90, 90d supply, fill #0
  Filled 2021-11-09: qty 90, 90d supply, fill #1
  Filled 2022-02-09: qty 90, 90d supply, fill #2

## 2021-03-09 NOTE — Telephone Encounter (Signed)
*  STAT* If patient is at the pharmacy, call can be transferred to refill team.   1. Which medications need to be refilled? (please list name of each medication and dose if known) amLODipine (NORVASC) 5 MG tablet  2. Which pharmacy/location (including street and city if local pharmacy) is medication to be sent to? [facility]  3. Do they need a 30 day or 90 day supply? 90 day supply

## 2021-03-09 NOTE — Telephone Encounter (Signed)
Left Voicemail for patient, stating his amlodipine has been sent to [facility].

## 2021-03-10 LAB — LIPOPROTEIN A (LPA): Lipoprotein (a): 164 nmol/L — ABNORMAL HIGH (ref <75.0)

## 2021-03-10 LAB — LIPID PANEL
Chol/HDL Ratio: 2 ratio (ref 0.0–5.0)
Cholesterol, Total: 88 mg/dL — ABNORMAL LOW (ref 100–199)
HDL: 43 mg/dL (ref >39)
LDL Chol Calc (NIH): 10 mg/dL (ref 0–99)
Triglycerides: 238 mg/dL — ABNORMAL HIGH (ref 0–149)
VLDL Cholesterol Cal: 35 mg/dL (ref 5–40)

## 2021-03-10 NOTE — Addendum Note (Signed)
Addended by: Sandi Mariscal on: 03/10/2021 12:50 PM   Modules accepted: Orders

## 2021-03-21 ENCOUNTER — Other Ambulatory Visit: Payer: Self-pay | Admitting: Cardiovascular Disease

## 2021-03-22 NOTE — Telephone Encounter (Signed)
Refill Request.  

## 2021-03-23 ENCOUNTER — Other Ambulatory Visit: Payer: Self-pay | Admitting: Cardiovascular Disease

## 2021-03-23 ENCOUNTER — Other Ambulatory Visit (HOSPITAL_COMMUNITY): Payer: Self-pay

## 2021-03-23 MED ORDER — COLCHICINE 0.6 MG PO TABS
0.6000 mg | ORAL_TABLET | Freq: Every day | ORAL | 1 refills | Status: DC
  Filled 2021-03-23: qty 30, 30d supply, fill #0
  Filled 2021-04-25: qty 30, 30d supply, fill #1

## 2021-03-23 NOTE — Telephone Encounter (Signed)
Refill request

## 2021-04-15 ENCOUNTER — Telehealth: Payer: Self-pay | Admitting: Pulmonary Disease

## 2021-04-15 NOTE — Telephone Encounter (Signed)
Dr. Vassie Loll patient would like for you to look at his CXR   Please advise

## 2021-04-20 ENCOUNTER — Other Ambulatory Visit (HOSPITAL_COMMUNITY): Payer: Self-pay

## 2021-04-25 ENCOUNTER — Other Ambulatory Visit (HOSPITAL_COMMUNITY): Payer: Self-pay

## 2021-04-29 NOTE — Telephone Encounter (Signed)
Oretha Milch, MD to Oretha Milch, MD     2:34 PM Reviewed CXR films with him

## 2021-05-04 ENCOUNTER — Other Ambulatory Visit (HOSPITAL_COMMUNITY): Payer: Self-pay

## 2021-05-04 MED FILL — Rosuvastatin Calcium Tab 5 MG: ORAL | 90 days supply | Qty: 90 | Fill #1 | Status: AC

## 2021-05-05 ENCOUNTER — Encounter: Payer: Self-pay | Admitting: Orthopedic Surgery

## 2021-05-05 ENCOUNTER — Ambulatory Visit (INDEPENDENT_AMBULATORY_CARE_PROVIDER_SITE_OTHER): Payer: 59 | Admitting: Orthopedic Surgery

## 2021-05-05 ENCOUNTER — Other Ambulatory Visit: Payer: Self-pay

## 2021-05-05 DIAGNOSIS — M654 Radial styloid tenosynovitis [de Quervain]: Secondary | ICD-10-CM | POA: Diagnosis not present

## 2021-05-05 MED ORDER — LIDOCAINE HCL 1 % IJ SOLN
1.0000 mL | INTRAMUSCULAR | Status: AC | PRN
  Administered 2021-05-05: 1 mL

## 2021-05-05 NOTE — Progress Notes (Signed)
Office Visit Note   Patient: Michael Franklin           Date of Birth: [birth year]           MRN: [id number] Visit Date: 05/05/2021              Requested by: Iran Ouch, MD [address] [place],  [place] [address] PCP: Iran Ouch, MD   Assessment & Plan: Visit Diagnoses:  1. Tenosynovitis, de Quervain     Plan: We discussed the diagnosis, prognosis, non-operative and operative treatment options for De Quervains tenosynovitis.  After our discussion, the patient would like to proceed with corticosteroid injection and continued bracing.  We reviewed the risks and benefits of conservative management.  The patient expressed understanding of the reasoning and strategy going forward.  All patient questions and concerns were addressed.    Follow-Up Instructions: No follow-ups on file.   Orders:  No orders of the defined types were placed in this encounter.  No orders of the defined types were placed in this encounter.     Procedures: Hand/UE Inj: L extensor compartment 1 for de Quervain's tenosynovitis on 05/05/2021 9:49 AM Indications: therapeutic Details: 27 G needle, radial approach Medications: 1 mL lidocaine 1 % ( Betamethasone (6mg /mL); 1 mL) Procedure, treatment alternatives, risks and benefits explained, specific risks discussed. Consent was given by the patient.      Clinical Data: No additional findings.   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  He has been wearing a thumb spica type brace with minimal symptom relief.  His pain is worse with radial deviation of the wrist or accidental extension of the thumb.  The pain can be quite severe, 8/10.  It is sharp in nature. He takes ibuprofen occasionally and has used ice with some symptom relief.   Review of Systems   Constitutional: Negative.   Respiratory: Negative.    Cardiovascular: Negative.   Skin:        Scar on volar left forearm from prior CABG surgery  Neurological: Negative.   Psychiatric/Behavioral: Negative.      Objective: Vital Signs: There were no vitals taken for this visit.  Physical Exam Cardiovascular:     Rate and Rhythm: Normal rate.     Pulses: Normal pulses.  Pulmonary:     Effort: Pulmonary effort is normal.  Skin:    General: Skin is warm and dry.     Capillary Refill: Capillary refill takes less than 2 seconds.  Neurological:     General: No focal deficit present.     Mental Status: He is alert.    Left Hand Exam   Tenderness  Left hand tenderness location: TTP around radial styloid in area of 1st dorsal compartment with obvious swelling.   Range of Motion  The patient has normal left wrist ROM.  Muscle Strength  The patient has normal left wrist strength.  Tests  Finkelstein's test: positive  Other  Erythema: absent Sensation: normal Pulse: present     Specialty Comments:  No specialty comments available.  Imaging: No results found.   PMFS History: Patient Active Problem List   Diagnosis Date Noted   Tenosynovitis, 55 05/05/2021   Coronary artery disease    NSTEMI (non-ST elevated myocardial infarction) (HCC) 06/15/2020   S/P CABG x 3  06/15/2020   Chest pain 06/14/2020   Non-ST elevation (NSTEMI) myocardial infarction [facility])    Past Medical History:  Diagnosis Date   Hyperlipidemia    NSTEMI (non-ST elevated myocardial infarction) (HCC)     Family History  Problem Relation Age of Onset   Asthma Mother    Hypertension Father    CAD Father    Hyperlipidemia Father     Past Surgical History:  Procedure Laterality Date   CORONARY ARTERY BYPASS GRAFT N/A 06/15/2020   Procedure: CORONARY ARTERY BYPASS GRAFTING (CABG) TIMES THREE USING HARVESTED LEFT RADIAL ARTERY, LEFT INTERNAL MAMMARY ARTERY,RIGHT GREATER SAPHENOUS  VEIN,HARVESTED ENDOSCOPICALLY. Removal of balloon pump.;  Surgeon: Corliss Skains, MD;  Location: [facility];  Service: Open Heart Surgery;  Laterality: N/A;   CORONARY/GRAFT ACUTE MI REVASCULARIZATION N/A 06/14/2020   Procedure: Coronary/Graft Acute MI Revascularization;  Surgeon: Iran Ouch, MD;  Location: [facility] INVASIVE CV LAB;  Service: Cardiovascular;  Laterality: N/A;   IABP INSERTION N/A 06/15/2020   Procedure: IABP Insertion;  Surgeon: Iran Ouch, MD;  Location: [facility] INVASIVE CV LAB;  Service: Cardiovascular;  Laterality: N/A;   LEFT HEART CATH AND CORONARY ANGIOGRAPHY N/A 06/14/2020   Procedure: LEFT HEART CATH AND CORONARY ANGIOGRAPHY;  Surgeon: Iran Ouch, MD;  Location: [facility] INVASIVE CV LAB;  Service: Cardiovascular;  Laterality: N/A;   LEFT HEART CATH AND CORS/GRAFTS ANGIOGRAPHY N/A 11/05/2020   Procedure: LEFT HEART CATH AND CORS/GRAFTS ANGIOGRAPHY;  Surgeon: Iran Ouch, MD;  [facility]: [facility] INV[facility] CV LAB;  Service: Cardiovascular;  Laterality: N/A;   TEE WITHOUT CARDIOVERSION N/A 06/15/2020   Procedure: TRANSESOPHAGEAL ECHOCARDIOGRAM (TEE);  Surgeon: Corliss Skains, MD;  Location: [facility] OR;  Service: Open Heart Surgery;  Laterality: N/A;   Social History   Occupational History   Not on file  Tobacco Use   Smoking status: Never   Smokeless tobacco: Never  Substance and Sexual Activity   Alcohol use: Never   Drug use: Never   Sexual activity: Not on file

## 2021-05-06 ENCOUNTER — Other Ambulatory Visit (HOSPITAL_COMMUNITY): Payer: Self-pay

## 2021-05-06 MED ORDER — OMRON 3 SERIES BP MONITOR DEVI
0 refills | Status: AC
  Filled 2021-05-06: qty 1, 30d supply, fill #0

## 2021-07-06 ENCOUNTER — Encounter: Payer: Self-pay | Admitting: Pulmonary Disease

## 2021-07-06 ENCOUNTER — Other Ambulatory Visit: Payer: Self-pay

## 2021-07-06 ENCOUNTER — Ambulatory Visit (INDEPENDENT_AMBULATORY_CARE_PROVIDER_SITE_OTHER): Payer: 59 | Admitting: Pulmonary Disease

## 2021-07-06 VITALS — BP 138/90 | HR 58 | Temp 98.4°F | Ht 69.0 in | Wt 176.1 lb

## 2021-07-06 DIAGNOSIS — J90 Pleural effusion, not elsewhere classified: Secondary | ICD-10-CM | POA: Diagnosis not present

## 2021-07-06 NOTE — Assessment & Plan Note (Signed)
Chest x-ray from July 2020 shows blunting of costophrenic angle-this could indicate pleural thickening or small effusion, certainly worse when compared to March.  His history indicates relapsing situation which seems to respond to colchicine or NSAIDs indicating an inflammatory nature. He does not have any systemic signs of collagen vascular disease and pleurisy appears to be unilateral, does not appear to be related to CABG. At this time we will obtain a chest x-ray to assess and consider an ultrasound of the chest to establish whether there is an effusion or whether this is simply pleural thickening.  If above did not reveal, may need CT chest to further evaluate.  Would also recommend ESR whenever he gets his blood work done next

## 2021-07-06 NOTE — Progress Notes (Signed)
Subjective:    Patient ID: Michael Franklin, male    DOB: 1976-02-07, 45 y.o.   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 He underwent emergent CABG in October 2021 with LIMA and harvesting of the left radial artery.  He has recovered well since then and resume his usual activity levels and exercise.  For the past 6 months he reports right-sided nonradiating chest heaviness and pain especially on lying down at night and lying on the right side.  On waking up he feels a swishing feeling as if fluid is moving around in his right chest. Review of his chest x-ray from 02/2021 shows blunting of right costophrenic angle which may be related to pleural thickening and appears to be worse compared to his previous x-rays from March 2022 and earlier.  He was prescribed amlodipine due to persistent chest pain, repeat cath in January 22 did not show any obstructive disease. He has tried colchicine at frequent intervals and this seems to relieve his chest pain.  Taking NSAIDs over-the-counter also seems to help.  He denies fevers, arthralgias  Medication review otherwise shows aspirin and Repatha for hyperlipidemia  He lives with his wife and 4 kids in [place], lifetime never smoker  Past Medical History:  Diagnosis Date   Hyperlipidemia    NSTEMI (non-ST elevated myocardial infarction) [facility])    Past Surgical History:  Procedure Laterality Date   CORONARY ARTERY BYPASS GRAFT N/A 06/15/2020   Procedure: CORONARY ARTERY BYPASS GRAFTING (CABG) TIMES THREE USING HARVESTED LEFT RADIAL ARTERY, LEFT INTERNAL MAMMARY ARTERY,RIGHT GREATER SAPHENOUS VEIN,HARVESTED ENDOSCOPICALLY. Removal of balloon pump.;  Surgeon: Corliss Skains, MD;  Location: [facility] OR;  Service: Open Heart Surgery;  Laterality: N/A;   CORONARY/GRAFT ACUTE MI REVASCULARIZATION N/A 06/14/2020   Procedure: Coronary/Graft Acute MI Revascularization;   Surgeon: Iran Ouch, MD;  Location: [facility] INVASIVE CV LAB;  Service: Cardiovascular;  Laterality: N/A;   IABP INSERTION N/A 06/15/2020   Procedure: IABP Insertion;  Surgeon: Iran Ouch, MD;  Location: [facility] INVASIVE CV LAB;  Service: Cardiovascular;  Laterality: N/A;   LEFT HEART CATH AND CORONARY ANGIOGRAPHY N/A 06/14/2020   Procedure: LEFT HEART CATH AND CORONARY ANGIOGRAPHY;  Surgeon: Iran Ouch, MD;  Location: [facility] INVASIVE CV LAB;  Service: Cardiovascular;  Laterality: N/A;   LEFT HEART CATH AND CORS/GRAFTS ANGIOGRAPHY N/A 11/05/2020   Procedure: LEFT HEART CATH AND CORS/GRAFTS ANGIOGRAPHY;  Surgeon: Iran Ouch, MD;  [facility]: [facility] LAB;  Service: Cardiovascular;  Laterality: N/A;   TEE WITHOUT CARDIOVERSION N/A 06/15/2020   Procedure: TRANSESOPHAGEAL ECHOCARDIOGRAM (TEE);  Surgeon: Corliss Skains, MD;  Location: [facility] OR;  Service: Open Heart Surgery;  Laterality: N/A;    Allergies  Allergen Reactions   Atorvastatin     myalgias   Crestor [Rosuvastatin]     higher doses cause myalgias   Penicillins     Social History   Socioeconomic History   Marital status: Married    Spouse name: Not on file   Number of children: Not on file   Years of education: Not on file   Highest education level: Not on file  Occupational History   Not on file  Tobacco Use   Smoking status: Never   Smokeless tobacco: Never  Substance and Sexual Activity   Alcohol use: Never   Drug use: Never   Sexual activity: Not on file  Other Topics Concern   Not on  file  Social History Narrative   Not on file   Social Determinants of Health   Financial Resource Strain: Not on file  Food Insecurity: Not on file  Transportation Needs: Not on file  Physical Activity: Not on file  Stress: Not on file  Social Connections: Not on file  Intimate Partner Violence: Not on file      Family History  Problem Relation Age of Onset   Asthma Mother    Hypertension Father     CAD Father    Hyperlipidemia Father       Review of Systems  Constitutional: negative for anorexia, fevers and sweats  Eyes: negative for irritation, redness and visual disturbance  Ears, nose, mouth, throat, and face: negative for earaches, epistaxis, nasal congestion and sore throat  Respiratory: negative for cough, dyspnea on exertion, sputum and wheezing  Cardiovascular: negative for  dyspnea, lower extremity edema, orthopnea, palpitations and syncope  Gastrointestinal: negative for abdominal pain, constipation, diarrhea, melena, nausea and vomiting  Genitourinary:negative for dysuria, frequency and hematuria  Hematologic/lymphatic: negative for bleeding, easy bruising and lymphadenopathy  Musculoskeletal:negative for arthralgias, muscle weakness and stiff joints  Neurological: negative for coordination problems, gait problems, headaches and weakness  Endocrine: negative for diabetic symptoms including polydipsia, polyuria and weight loss     Objective:   Physical Exam   Gen. Pleasant, well-nourished, in no distress, normal affect ENT - no pallor,icterus, no post nasal drip Neck: No JVD, no thyromegaly, no carotid bruits Lungs: no use of accessory muscles, no dullness to percussion,decreased RT base without rales or rhonchi  Cardiovascular: Rhythm regular, heart sounds  normal, no murmurs or gallops, no peripheral edema Abdomen: soft and non-tender, no hepatosplenomegaly, BS normal. Musculoskeletal: No deformities, no cyanosis or clubbing Neuro:  alert, non focal        Assessment & Plan:

## 2021-07-06 NOTE — Patient Instructions (Signed)
CXR today We can consider ultrasound of chest Let me know if symptoms recur

## 2021-07-13 ENCOUNTER — Ambulatory Visit (INDEPENDENT_AMBULATORY_CARE_PROVIDER_SITE_OTHER): Payer: 59

## 2021-07-13 DIAGNOSIS — J90 Pleural effusion, not elsewhere classified: Secondary | ICD-10-CM

## 2021-07-14 ENCOUNTER — Institutional Professional Consult (permissible substitution): Payer: 59 | Admitting: Internal Medicine

## 2021-07-15 NOTE — Progress Notes (Signed)
Spoke with pt and notified of results per Dr. Alva. Pt verbalized understanding and denied any questions. 

## 2021-07-19 ENCOUNTER — Encounter: Payer: Self-pay | Admitting: Cardiovascular Disease

## 2021-07-19 ENCOUNTER — Other Ambulatory Visit: Payer: Self-pay

## 2021-07-19 ENCOUNTER — Other Ambulatory Visit (HOSPITAL_COMMUNITY): Payer: Self-pay

## 2021-07-19 ENCOUNTER — Ambulatory Visit (INDEPENDENT_AMBULATORY_CARE_PROVIDER_SITE_OTHER): Payer: 59 | Admitting: Cardiovascular Disease

## 2021-07-19 VITALS — BP 126/78 | HR 57 | Ht 70.0 in | Wt 177.0 lb

## 2021-07-19 DIAGNOSIS — E785 Hyperlipidemia, unspecified: Secondary | ICD-10-CM

## 2021-07-19 DIAGNOSIS — I2510 Atherosclerotic heart disease of native coronary artery without angina pectoris: Secondary | ICD-10-CM

## 2021-07-19 DIAGNOSIS — I10 Essential (primary) hypertension: Secondary | ICD-10-CM

## 2021-07-19 DIAGNOSIS — I25118 Atherosclerotic heart disease of native coronary artery with other forms of angina pectoris: Secondary | ICD-10-CM

## 2021-07-19 MED ORDER — ZOLPIDEM TARTRATE 5 MG PO TABS
5.0000 mg | ORAL_TABLET | ORAL | 4 refills | Status: DC
  Filled 2021-07-19: qty 30, 30d supply, fill #0

## 2021-07-19 MED ORDER — NITROGLYCERIN 0.4 MG SL SUBL
0.4000 mg | SUBLINGUAL_TABLET | SUBLINGUAL | 6 refills | Status: DC | PRN
  Filled 2021-07-19: qty 25, 5d supply, fill #0
  Filled 2022-02-09: qty 25, 5d supply, fill #1

## 2021-07-19 MED ORDER — ZOLPIDEM TARTRATE 5 MG PO TABS: 5.0000 mg | ORAL_TABLET | Freq: Every evening | ORAL | 4 refills | Status: DC | PRN

## 2021-07-19 MED ORDER — OMEPRAZOLE 20 MG PO CPDR
DELAYED_RELEASE_CAPSULE | Freq: Every day | ORAL | 3 refills | Status: DC
  Filled 2021-07-19: qty 90, 90d supply, fill #0

## 2021-07-19 NOTE — Progress Notes (Signed)
Cardiology Office Note   Date:  07/20/2021   ID:  Michael Franklin, DOB Apr 08, 1976, MRN [id number]  PCP:  Iran Ouch, MD  Cardiologist:   Lorine Bears, MD   No chief complaint on file.     History of Present Illness: Michael Franklin is a 45 y.o. male who presents for a follow-up visit regarding coronary artery disease status post recent CABG. He is one of our hospitalists.  He has known history of hyperlipidemia and family history of coronary artery disease.  His father had CABG in his 50s.  The patient has been very healthy throughout his life with excellent lifestyle and he plays soccer on a regular basis.  He did not tolerate atorvastatin in the past due to myalgia.  He presented in Oct 2021 with non-ST elevation myocardial infarction. EKG was unremarkable with the exception of possible septal Q waves.Echocardiogram showed an EF of 50 to 55% with mild mitral regurgitation.  Cardiac catheterization surprisingly showed severe three-vessel coronary artery disease with an occluded ostial LAD which was significantly calcified with collaterals noted from the right coronary artery, 95% stenosis in large ostial ramus and severe right PDA stenosis.  LVEDP was 24.  The patient underwent CABG by Dr. Cliffton Asters.  The patient was referred to Dr. Rennis Golden to evaluate for premature atherosclerosis and was found to have significantly elevated LP(a) elevation.  The patient has been tolerating small dose Crestor and was started on Repatha.  He had symptomatic PVCs that initially responded to treatment with Toprol but he developed bradycardia with fatigue and thus I instructed him to continue amlodipine alone.  He had recurrent chest pain since CABG with some intense episodes partially responded to nitroglycerin.  Repeat cardiac catheterization was done in March of this year  which showed significant underlying three-vessel coronary artery disease with patent LIMA to LAD, patent radial to ramus and patent  SVG to right PDA.  There was moderate disease in the proximal portion of SVG to right PDA.  However, the right PDA itself was relatively supplying a small territory and the native RCA was not significantly diseased other than the right PDA.  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   He improved with colchicine.  He has been doing well and resume playing soccer.  He does not have any exertional symptoms at all.  However, he does have episodes chest pain at rest especially when he is under stress.  He takes nitroglycerin with improvement.  He has a lot of responsibilities at work and he is very busy.  In addition, he has significant stress at home.  He has insomnia.   Past Medical History:  Diagnosis Date   Hyperlipidemia    NSTEMI (non-ST elevated myocardial infarction) [facility])     Past Surgical History:  Procedure Laterality Date   CORONARY ARTERY BYPASS GRAFT N/A 06/15/2020   Procedure: CORONARY ARTERY BYPASS GRAFTING (CABG) TIMES THREE USING HARVESTED LEFT RADIAL ARTERY, LEFT INTERNAL MAMMARY ARTERY,RIGHT GREATER SAPHENOUS VEIN,HARVESTED ENDOSCOPICALLY. Removal of balloon pump.;  Surgeon: Corliss Skains, MD;  Location: [facility] OR;  Service: Open Heart Surgery;  Laterality: N/A;   CORONARY/GRAFT ACUTE MI REVASCULARIZATION N/A 06/14/2020   Procedure: Coronary/Graft Acute MI Revascularization;  Surgeon: Iran Ouch, MD;  Location: [facility] INVASIVE CV LAB;  Service: Cardiovascular;  Laterality: N/A;   IABP INSERTION N/A 06/15/2020   Procedure: IABP Insertion;  Surgeon: Iran Ouch, MD;  Location: [facility] INVASIVE CV LAB;  Service: Cardiovascular;  Laterality: N/A;   LEFT HEART CATH AND CORONARY ANGIOGRAPHY N/A 06/14/2020   Procedure: LEFT HEART CATH AND CORONARY ANGIOGRAPHY;  Surgeon: Iran Ouch, MD;  Location: [facility] INVASIVE CV LAB;  Service: Cardiovascular;  Laterality: N/A;   LEFT HEART CATH AND CORS/GRAFTS ANGIOGRAPHY N/A 11/05/2020   Procedure:  LEFT HEART CATH AND CORS/GRAFTS ANGIOGRAPHY;  Surgeon: Iran Ouch, MD;  Location: [facility] LAB;  Service: Cardiovascular;  Laterality: N/A;   TEE WITHOUT CARDIOVERSION N/A 06/15/2020   Procedure: TRANSESOPHAGEAL ECHOCARDIOGRAM (TEE);  Surgeon: Corliss Skains, MD;  Location: [facility] OR;  Service: Open Heart Surgery;  Laterality: N/A;     Current Outpatient Medications  Medication Sig Dispense Refill   amLODipine (NORVASC) 5 MG tablet TAKE 1 TABLET BY MOUTH ONCE A DAY 90 tablet 3   aspirin EC 325 MG EC tablet Take 1 tablet (325 mg total) by mouth daily. 30 tablet 0   Blood Pressure Monitoring (OMRON 3 SERIES BP MONITOR) DEVI Use as directed 1 each 0   Evolocumab 140 MG/ML SOAJ INJECT 1 DOSE INTO THE SKIN EVERY 14 DAYS 2 mL 11   rosuvastatin (CRESTOR) 5 MG tablet TAKE 1 TABLET BY MOUTH ONCE A DAY 90 tablet 3   nitroGLYCERIN (NITROSTAT) 0.4 MG SL tablet Place 1 tablet under the tongue every 5 minutes as needed for chest pain. 25 tablet 6   omeprazole (PRILOSEC) 20 MG capsule TAKE 1 CAPSULE BY MOUTH DAILY. 90 capsule 3   zolpidem (AMBIEN) 5 MG tablet Take 1 tablet (5 mg total) by mouth at bedtime as needed for insomnia 30 tablet 4   zolpidem (AMBIEN) 5 MG tablet Take 1 tablet by mouth at bedtime as needed for insomnia. 30 tablet 4   No current facility-administered medications for this visit.    Allergies:   Atorvastatin, Crestor [rosuvastatin], and Penicillins    Social History:  The patient  reports that he has never smoked. He has never used smokeless tobacco. He reports that he does not drink alcohol and does not use drugs.   Family History:  The patient's family history includes Asthma in his mother; CAD in his father; Hyperlipidemia in his father; Hypertension in his father.    ROS:  Please see the history of present illness.   Otherwise, review of systems are positive for none.   All other systems are reviewed and negative.    PHYSICAL EXAM: VS:  BP 126/78   Pulse  (!) 57   Ht 5\' 10"  (1.778 m)   Wt 177 lb (80.3 kg)   SpO2 99%   BMI 25.40 kg/m  , BMI Body mass index is 25.4 kg/m. GEN: Well nourished, well developed, in no acute distress  HEENT: normal  Neck: no JVD, carotid bruits, or masses Cardiac: RRR; no murmurs, rubs, or gallops,no edema  Respiratory:  clear to auscultation bilaterally, normal work of breathing GI: soft, nontender, nondistended, + BS MS: no deformity or atrophy  Skin: warm and dry, no rash Neuro:  Strength and sensation are intact Psych: euthymic mood, full affect   EKG:  EKG is ordered today. The ekg ordered today demonstrates sinus bradycardia with possible biatrial enlargement.  No significant ST.   Recent Labs: 10/30/2020: BUN 15; Creatinine, Ser 1.15; Hemoglobin 13.5; Platelets 263; Potassium 5.1; Sodium 138    Lipid Panel    Component Value Date/Time   CHOL 88 (L) 03/09/2021 1237   TRIG 238 (H) 03/09/2021 1237   HDL 43 03/09/2021 1237   CHOLHDL 2.0  03/09/2021 1237   CHOLHDL 4.5 06/14/2020 1729   VLDL 13 06/14/2020 1729   LDLCALC 10 03/09/2021 1237      Wt Readings from Last 3 Encounters:  07/19/21 177 lb (80.3 kg)  07/06/21 176 lb 1.9 oz (79.9 kg)  12/14/20 171 lb 6.4 oz (77.7 kg)       No flowsheet data found.    ASSESSMENT AND PLAN:  1.  Coronary artery disease involving native coronary arteries without angina: He is doing well overall with no anginal symptoms.  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   I suspect that stress is significantly contributing to his recurrent episodes of chest pain at rest.  2.  Hyperlipidemia: Continue treatment with small dose rosuvastatin and Repatha.  3.  Essential hypertension: Continue treatment with amlodipine.  4.  PVCs: Resolved.  He is off Toprol.  5.  Anxiety: The patient has significant stress from workload as well as stress at home.  This situation is likely not sustainable and I  discussed with him the importance of controlling his stress level.  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  Frequent episodes of chest pain at rest seems to correlate with stress.   Disposition:   FU with me in 6 months  Signed,  Lorine Bears, MD  07/20/2021 6:03 PM    [facility]

## 2021-07-19 NOTE — Patient Instructions (Signed)
Medication Instructions:  Your physician recommends that you continue on your current medications as directed. Please refer to the Current Medication list given to you today.  *If you need a refill on your cardiac medications before your next appointment, please call your pharmacy*  Follow-Up: At [facility], you and your health needs are our priority.  As part of our continuing mission to provide you with exceptional heart care, we have created designated Provider Care Teams.  These Care Teams include your primary Cardiologist (physician) and Advanced Practice Providers (APPs -  Physician Assistants and Nurse Practitioners) who all work together to provide you with the care you need, when you need it.  We recommend signing up for the patient portal called "MyChart".  Sign up information is provided on this After Visit Summary.  MyChart is used to connect with patients for Virtual Visits (Telemedicine).  Patients are able to view lab/test results, encounter notes, upcoming appointments, etc.  Non-urgent messages can be sent to your provider as well.   To learn more about what you can do with MyChart, go to [email or web address].    Your next appointment:   6 month(s)  The format for your next appointment:   In Person  Provider:   Muhammad Arida, MD  

## 2021-08-03 ENCOUNTER — Other Ambulatory Visit: Payer: Self-pay

## 2021-08-03 ENCOUNTER — Ambulatory Visit (INDEPENDENT_AMBULATORY_CARE_PROVIDER_SITE_OTHER): Payer: 59

## 2021-08-03 DIAGNOSIS — R079 Chest pain, unspecified: Secondary | ICD-10-CM | POA: Diagnosis not present

## 2021-08-03 NOTE — Progress Notes (Signed)
Pt presented today for Nurse Visit- EKG per Randall An, PA-C for recurrent chest pains.

## 2021-08-03 NOTE — Progress Notes (Signed)
   Patient made me aware while rounding at [facility] he was having chest pain. Previously occurring last week as well when he saw Dr. Kirke Corin. He did not wish to go to the Emergency Dept for evaluation. Nurse Visit arranged for EKG which shows sinus bradycardia with no acute ST changes when compared to prior tracings. He is aware of symptoms that would warrant ED evaluation. Will route to Dr. Kirke Corin as an Lorain Childes.   Signed, Ellsworth Lennox, PA-C 08/03/2021, 11:53 AM

## 2021-08-11 ENCOUNTER — Other Ambulatory Visit (HOSPITAL_COMMUNITY): Payer: Self-pay

## 2021-08-16 IMAGING — DX DG CHEST 1V PORT
1 series · 1 of 1 positions shown · non-contrast
Comparison: 06/15/2020.

CLINICAL DATA: CABG.

EXAM:
PORTABLE CHEST 1 VIEW

[chest ap]
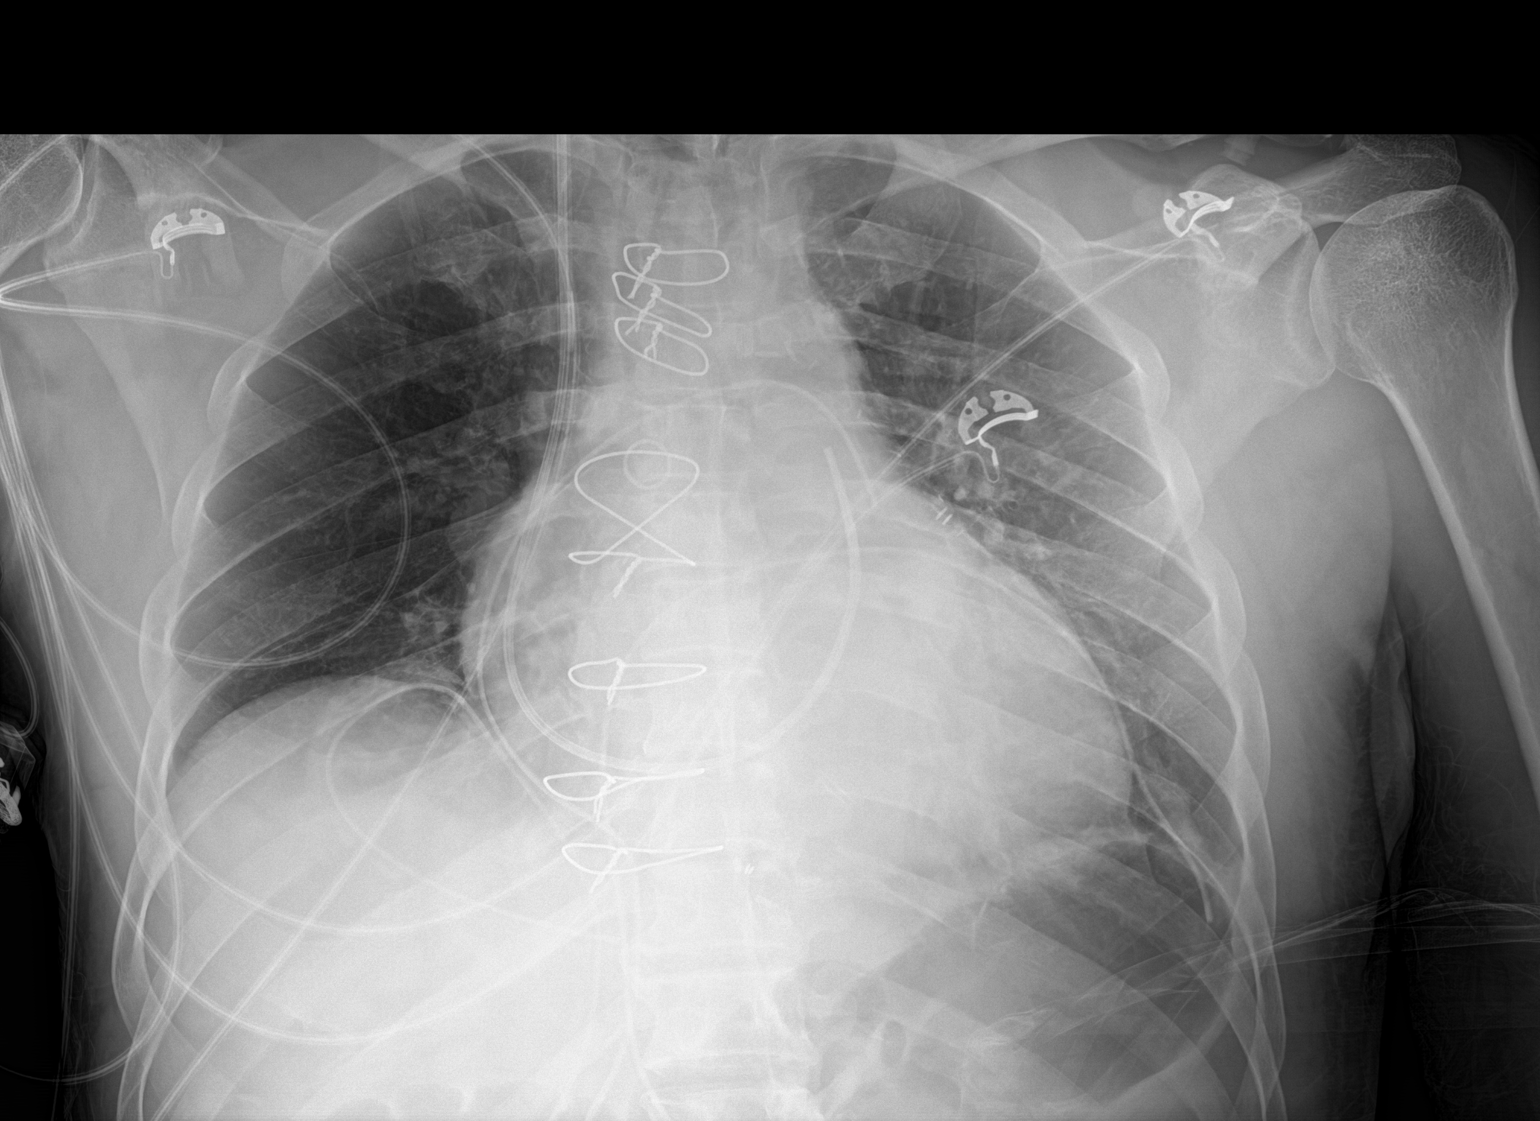

[1 of 1 positions shown; findings below may reference images not displayed]

FINDINGS: Interim extubation and removal of NG tube. Swan-Ganz catheter and
mediastinal drainage catheters in stable position. Prior CABG.
Cardiomegaly. No pulmonary venous congestion. Low lung volumes with
mild bibasilar atelectasis. Tiny left pleural effusion cannot be
excluded. No pneumothorax. Mild left neck subcutaneous emphysema.
IMPRESSION: 1. Interim extubation and removal of NG tube. Swan-Ganz catheter and
mediastinal drainage catheters in stable position. No pneumothorax.
2. Prior CABG. Cardiomegaly. No pulmonary venous congestion.

## 2021-08-17 ENCOUNTER — Other Ambulatory Visit: Payer: Self-pay | Admitting: Internal Medicine

## 2021-08-17 ENCOUNTER — Other Ambulatory Visit (HOSPITAL_COMMUNITY): Payer: Self-pay

## 2021-08-17 MED ORDER — ROSUVASTATIN CALCIUM 5 MG PO TABS
ORAL_TABLET | Freq: Every day | ORAL | 3 refills | Status: DC
  Filled 2021-08-17: qty 90, 90d supply, fill #0
  Filled 2021-11-09: qty 90, 90d supply, fill #1
  Filled 2022-02-09: qty 90, 90d supply, fill #2
  Filled 2022-05-10: qty 90, 90d supply, fill #3

## 2021-09-03 ENCOUNTER — Telehealth: Payer: 59 | Admitting: Emergency Medicine

## 2021-09-03 DIAGNOSIS — U071 COVID-19: Secondary | ICD-10-CM | POA: Diagnosis not present

## 2021-09-03 MED ORDER — ALBUTEROL SULFATE HFA 108 (90 BASE) MCG/ACT IN AERS: 1.0000 | INHALATION_SPRAY | Freq: Four times a day (QID) | RESPIRATORY_TRACT | 0 refills | Status: DC | PRN

## 2021-09-03 MED ORDER — NIRMATRELVIR/RITONAVIR (PAXLOVID)TABLET: 3.0000 | ORAL_TABLET | Freq: Two times a day (BID) | ORAL | 0 refills | Status: AC

## 2021-09-03 MED ORDER — PREDNISONE 10 MG (21) PO TBPK: ORAL_TABLET | Freq: Every day | ORAL | 0 refills | Status: DC

## 2021-09-03 NOTE — Progress Notes (Signed)
Virtual Visit Consent   Michael Franklin, you are scheduled for a virtual visit with a [facility] provider today.     Just as with appointments in the office, your consent must be obtained to participate.  Your consent will be active for this visit and any virtual visit you may have with one of our providers in the next 365 days.     If you have a MyChart account, a copy of this consent can be sent to you electronically.  All virtual visits are billed to your insurance company just like a traditional visit in the office.    As this is a virtual visit, video technology does not allow for your provider to perform a traditional examination.  This may limit your provider's ability to fully assess your condition.  If your provider identifies any concerns that need to be evaluated in person or the need to arrange testing (such as labs, EKG, etc.), we will make arrangements to do so.     Although advances in technology are sophisticated, we cannot ensure that it will always work on either your end or our end.  If the connection with a video visit is poor, the visit may have to be switched to a telephone visit.  With either a video or telephone visit, we are not always able to ensure that we have a secure connection.     I need to obtain your verbal consent now.   Are you willing to proceed with your visit today? Yes   Michael Franklin has provided verbal consent on 09/03/2021 for a virtual visit (video or telephone).   Michael Franklin, [place]   Date: 09/03/2021 6:51 PM   Virtual Visit via Video Note   I, Michael Franklin, connected with  Michael Franklin  ([id number], 03-26-1976) on 09/03/21 at  6:45 PM EST by a video-enabled telemedicine application and verified that I am speaking with the correct person using two identifiers.  Location: Patient: Virtual Visit Location Patient: Home Provider: Virtual Visit Location Provider: Home Office   I discussed the limitations of evaluation and management by  telemedicine and the availability of in person appointments. The patient expressed understanding and agreed to proceed.    History of Present Illness: Michael Franklin is a 46 y.o. male who presents with abrupt onset of sore throat, runny nose, dry cough, and body aches x 1 day.  Admits to sick exposure.  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  Has tried OTC medication without relief.  Denies aggravating factors.  Denies previous symptoms in the past.   Denies fever, chills, sinus pain, SOB, wheezing, chest pain, nausea, changes in bowel or bladder habits.    ROS: As per HPI.  All other pertinent ROS negative.     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   Chest pain 06/14/2020   Non-ST elevation (NSTEMI) myocardial infarction [facility])     Allergies:  Allergies  Allergen Reactions   Atorvastatin     myalgias   Crestor [Rosuvastatin]     higher doses cause myalgias   Penicillins    Medications:  Current Outpatient Medications:    albuterol (VENTOLIN HFA) 108 (90 Base) MCG/ACT inhaler, Inhale 1-2 puffs into the lungs every 6 (six) hours as needed for  wheezing or shortness of breath., Disp: 18 g, Rfl: 0   nirmatrelvir/ritonavir EUA (PAXLOVID) 20 x 150 MG & 10 x 100MG TABS, Take 3 tablets by mouth 2 (two) times daily for 5 days. (Take nirmatrelvir 150 mg two tablets twice daily for 5 days and ritonavir 100 mg one tablet twice daily for 5 days) Patient eGFR is >60, Disp: 30 tablet, Rfl: 0   predniSONE (STERAPRED UNI-PAK 21 TAB) 10 MG (21) TBPK tablet, Take by mouth daily. Take 6 tabs by mouth daily  for 2 days, then 5 tabs for 2 days, then 4 tabs for 2 days, then 3 tabs for 2 days, 2 tabs for 2 days, then 1 tab by mouth daily for 2 days, Disp: 42 tablet, Rfl: 0    amLODipine (NORVASC) 5 MG tablet, TAKE 1 TABLET BY MOUTH ONCE A DAY, Disp: 90 tablet, Rfl: 3   aspirin EC 325 MG EC tablet, Take 1 tablet (325 mg total) by mouth daily., Disp: 30 tablet, Rfl: 0   Blood Pressure Monitoring (OMRON 3 SERIES BP MONITOR) DEVI, Use as directed, Disp: 1 each, Rfl: 0   Evolocumab 140 MG/ML SOAJ, INJECT 1 DOSE INTO THE SKIN EVERY 14 DAYS, Disp: 2 mL, Rfl: 11   nitroGLYCERIN (NITROSTAT) 0.4 MG SL tablet, Place 1 tablet under the tongue every 5 minutes as needed for chest pain., Disp: 25 tablet, Rfl: 6   omeprazole (PRILOSEC) 20 MG capsule, TAKE 1 CAPSULE BY MOUTH DAILY., Disp: 90 capsule, Rfl: 3   rosuvastatin (CRESTOR) 5 MG tablet, TAKE 1 TABLET BY MOUTH ONCE A DAY, Disp: 90 tablet, Rfl: 3   zolpidem (AMBIEN) 5 MG tablet, Take 1 tablet (5 mg total) by mouth at bedtime as needed for insomnia, Disp: 30 tablet, Rfl: 4   zolpidem (AMBIEN) 5 MG tablet, Take 1 tablet by mouth at bedtime as needed for insomnia., Disp: 30 tablet, Rfl: 4  Observations/Objective: Patient is well-developed, well-nourished in no acute distress.  Resting comfortably at home.  Head is normocephalic, atraumatic.  No labored breathing. Speaking in full sentences and tolerating own secretions. Speech is clear and coherent with logical content.  Patient is alert and oriented at baseline.    Assessment and Plan: 1. COVID-19 virus infection  COVID test was positive You should remain isolated in your home for 5 days from symptom onset AND greater than 72 hours after symptoms resolution (absence of fever without the use of fever-reducing medication and improvement in respiratory symptoms), whichever is longer Get plenty of rest and push fluids Albuterol inhaler prescribed Prednisone taper prescribed Paxlovid prescribed per patient requests.  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   Use zyrtec for nasal  congestion, runny nose, and/or sore throat Use flonase for nasal congestion and runny nose Use medications daily for symptom relief Use OTC medications like ibuprofen or tylenol as needed fever or pain Follow up with PCP in 1-2 days via phone or e-visit for recheck and to ensure symptoms are improving Call or go to the ED if you have any new or worsening symptoms such as fever, worsening cough, shortness of breath, chest tightness, chest pain, turning blue, changes in mental status, etc...    Follow Up Instructions: I discussed the assessment and treatment plan with the patient. The patient was provided an opportunity to ask questions and all were answered. The patient agreed with the plan and demonstrated an understanding of the instructions.  A copy of instructions were sent to the patient via MyChart unless otherwise noted below.   The patient was advised to call back or seek an in-person evaluation if the symptoms worsen or if the condition fails to improve as anticipated.  Time:  I spent 5-10 minutes with the patient via telehealth technology discussing the above problems/concerns.    Michael Box, PA-C

## 2021-09-03 NOTE — Patient Instructions (Signed)
Akbar Demarco, thank you for joining [place], PA-C for today's virtual visit.  While this provider is not your primary care provider (PCP), if your PCP is located in our provider database this encounter information will be shared with them immediately following your visit.  Consent: (Patient) Michael Franklin provided verbal consent for this virtual visit at the beginning of the encounter.  Current Medications:  Current Outpatient Medications:    albuterol (VENTOLIN HFA) 108 (90 Base) MCG/ACT inhaler, Inhale 1-2 puffs into the lungs every 6 (six) hours as needed for wheezing or shortness of breath., Disp: 18 g, Rfl: 0   nirmatrelvir/ritonavir EUA (PAXLOVID) 20 x 150 MG & 10 x 100MG TABS, Take 3 tablets by mouth 2 (two) times daily for 5 days. (Take nirmatrelvir 150 mg two tablets twice daily for 5 days and ritonavir 100 mg one tablet twice daily for 5 days) Patient eGFR is >60, Disp: 30 tablet, Rfl: 0   predniSONE (STERAPRED UNI-PAK 21 TAB) 10 MG (21) TBPK tablet, Take by mouth daily. Take 6 tabs by mouth daily  for 2 days, then 5 tabs for 2 days, then 4 tabs for 2 days, then 3 tabs for 2 days, 2 tabs for 2 days, then 1 tab by mouth daily for 2 days, Disp: 42 tablet, Rfl: 0   amLODipine (NORVASC) 5 MG tablet, TAKE 1 TABLET BY MOUTH ONCE A DAY, Disp: 90 tablet, Rfl: 3   aspirin EC 325 MG EC tablet, Take 1 tablet (325 mg total) by mouth daily., Disp: 30 tablet, Rfl: 0   Blood Pressure Monitoring (OMRON 3 SERIES BP MONITOR) DEVI, Use as directed, Disp: 1 each, Rfl: 0   Evolocumab 140 MG/ML SOAJ, INJECT 1 DOSE INTO THE SKIN EVERY 14 DAYS, Disp: 2 mL, Rfl: 11   nitroGLYCERIN (NITROSTAT) 0.4 MG SL tablet, Place 1 tablet under the tongue every 5 minutes as needed for chest pain., Disp: 25 tablet, Rfl: 6   omeprazole (PRILOSEC) 20 MG capsule, TAKE 1 CAPSULE BY MOUTH DAILY., Disp: 90 capsule, Rfl: 3   rosuvastatin (CRESTOR) 5 MG tablet, TAKE 1 TABLET BY MOUTH ONCE A DAY, Disp: 90 tablet, Rfl: 3    zolpidem (AMBIEN) 5 MG tablet, Take 1 tablet (5 mg total) by mouth at bedtime as needed for insomnia, Disp: 30 tablet, Rfl: 4   zolpidem (AMBIEN) 5 MG tablet, Take 1 tablet by mouth at bedtime as needed for insomnia., Disp: 30 tablet, Rfl: 4   Medications ordered in this encounter:  Meds ordered this encounter  Medications   nirmatrelvir/ritonavir EUA (PAXLOVID) 20 x 150 MG & 10 x 100MG TABS    Sig: Take 3 tablets by mouth 2 (two) times daily for 5 days. (Take nirmatrelvir 150 mg two tablets twice daily for 5 days and ritonavir 100 mg one tablet twice daily for 5 days) Patient eGFR is >60    Dispense:  30 tablet    Refill:  0    Order Specific Question:   Supervising Provider    Answer:   Sabra Heck, BRIAN [[id number]]   albuterol (VENTOLIN HFA) 108 (90 Base) MCG/ACT inhaler    Sig: Inhale 1-2 puffs into the lungs every 6 (six) hours as needed for wheezing or shortness of breath.    Dispense:  18 g    Refill:  0    Order Specific Question:   Supervising Provider    Answer:   MILLER, BRIAN [[id number]]   predniSONE (STERAPRED UNI-PAK 21 TAB) 10 MG (21) TBPK tablet  Sig: Take by mouth daily. Take 6 tabs by mouth daily  for 2 days, then 5 tabs for 2 days, then 4 tabs for 2 days, then 3 tabs for 2 days, 2 tabs for 2 days, then 1 tab by mouth daily for 2 days    Dispense:  42 tablet    Refill:  0    Order Specific Question:   Supervising Provider    Answer:   Noemi Chapel [[id number]]     *If you need refills on other medications prior to your next appointment, please contact your pharmacy*  Follow-Up: Call back or seek an in-person evaluation if the symptoms worsen or if the condition fails to improve as anticipated.  Other Instructions COVID test was positive You should remain isolated in your home for 5 days from symptom onset AND greater than 72 hours after symptoms resolution (absence of fever without the use of fever-reducing medication and improvement in respiratory symptoms), whichever is  longer Get plenty of rest and push fluids Albuterol inhaler prescribed Prednisone taper prescribed Paxlovid prescribed per patient requests.  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   Use zyrtec for nasal congestion, runny nose, and/or sore throat Use flonase for nasal congestion and runny nose Use medications daily for symptom relief Use OTC medications like ibuprofen or tylenol as needed fever or pain Follow up with PCP in 1-2 days via phone or e-visit for recheck and to ensure symptoms are improving Call or go to the ED if you have any new or worsening symptoms such as fever, worsening cough, shortness of breath, chest tightness, chest pain, turning blue, changes in mental status, etc...    If you have been instructed to have an in-person evaluation today at a local Urgent Care facility, please use the link below. It will take you to a list of all of our available [facility], including address, phone number and hours of operation. Please do not delay care.  [facility]  If you or a family member do not have a primary care provider, use the link below to schedule a visit and establish care. When you choose a [facility] primary care physician or advanced practice provider, you gain a long-term partner in health. Find a Primary Care Provider  Learn more about [facility]'s in-office and virtual care options: [facility]

## 2021-11-07 ENCOUNTER — Ambulatory Visit (INDEPENDENT_AMBULATORY_CARE_PROVIDER_SITE_OTHER): Payer: 59 | Admitting: Internal Medicine

## 2021-11-07 ENCOUNTER — Encounter: Payer: Self-pay | Admitting: Internal Medicine

## 2021-11-07 ENCOUNTER — Telehealth: Payer: Self-pay | Admitting: Cardiovascular Disease

## 2021-11-07 ENCOUNTER — Other Ambulatory Visit (HOSPITAL_COMMUNITY): Payer: Self-pay

## 2021-11-07 ENCOUNTER — Other Ambulatory Visit: Payer: Self-pay

## 2021-11-07 VITALS — BP 124/78 | HR 51 | Ht 70.0 in | Wt 175.0 lb

## 2021-11-07 DIAGNOSIS — E785 Hyperlipidemia, unspecified: Secondary | ICD-10-CM

## 2021-11-07 DIAGNOSIS — Z951 Presence of aortocoronary bypass graft: Secondary | ICD-10-CM | POA: Diagnosis not present

## 2021-11-07 DIAGNOSIS — I10 Essential (primary) hypertension: Secondary | ICD-10-CM | POA: Diagnosis not present

## 2021-11-07 DIAGNOSIS — I25700 Atherosclerosis of coronary artery bypass graft(s), unspecified, with unstable angina pectoris: Secondary | ICD-10-CM | POA: Diagnosis not present

## 2021-11-07 LAB — TROPONIN I (HIGH SENSITIVITY): Troponin I (High Sensitivity): 5 ng/L (ref <18)

## 2021-11-07 MED ORDER — ISOSORBIDE MONONITRATE ER 30 MG PO TB24
30.0000 mg | ORAL_TABLET | Freq: Every evening | ORAL | 3 refills | Status: DC
  Filled 2021-11-07: qty 90, 90d supply, fill #0

## 2021-11-07 MED ORDER — ASPIRIN EC 81 MG PO TBEC
81.0000 mg | DELAYED_RELEASE_TABLET | Freq: Every day | ORAL | 3 refills | Status: AC
Start: 2021-11-07 — End: ?

## 2021-11-07 NOTE — Progress Notes (Addendum)
?Cardiology Office Note:   ? ?Date:  11/07/2021  ? ?IDNevin Franklin:  Michael Franklin, DOB 1976/05/19, MRN [id number] ? ?PCP:  Iran OuchArida, Muhammad A, MD  ? ?[facility] Providers ?Cardiologist:  Alverda Skeanshukkani, Talene Glastetter, MD ?Referring MD: Iran OuchArida, Muhammad A, MD  ? ?Chief Complaint/Reason for Referral: Chest pain ? ?ASSESSMENT:   ? ?Coronary artery disease involving coronary bypass graft of native heart with unstable angina pectoris (HCC) ? ?S/P CABG x 3 ? ?Essential hypertension ? ?Hyperlipidemia LDL goal <70 ? ?PLAN:   ? ?In order of problems listed above: ? ?My recommendation is that the patient be admitted for an invasive assessment due to an episode of unstable angina occurring at rest.  We did discuss things with Dr. Kirke CorinArida, the patient's primary cardiologist.  The patient has had episodes of chest pain since his CABG.  We have decided to draw high-sensitivity troponin.  If this is negative we will start Imdur 30 mg at bedtime.  If it is positive the patient will be admitted to the hospital for an invasive assessment.  Follow-up will be informed by what happens in regards to his work-up today. ?2.  Continue aspirin, evolocumab, Crestor, and as needed nitroglycerin.  Depending on the results above we will start Imdur 30 mg at bedtime.  Decrease aspirin to 81 mg. ?3.  Pressure stable.  Continue current regimen. ?4.  Followed by Dr. Rennis GoldenHilty, LDL last year was 10. ?  ?Update: Patient's high-sensitivity troponin was negative.  Discussed with patient.  We will start Imdur 30 mg and will reach out to Dr. Kirke CorinArida regarding follow-up. ? ?Dispo:  No follow-ups on file.  ?  ? ?Medication Adjustments/Labs and Tests Ordered: ?Current medicines are reviewed at length with the patient today.  Concerns regarding medicines are outlined above.  ? ?Tests Ordered: ?No orders of the defined types were placed in this encounter. ? ? ?Medication Changes: ?No orders of the defined types were placed in this encounter. ? ? ?History of Present Illness:   ? ?FOCUSED  PROBLEM LIST:   ?1.  Coronary artery disease status post LIMA to LAD and Y graft to PDA and ramus 2021 ?2.  Hypertension ?3.  Hyperlipidemia with elevated lipoprotein (a) ? ? ?The patient is a 46 y.o. male with the indicated medical history here for urgent visit.  The patient underwent cardiac catheterization last year due to chest pain.  The patient had an episode of chest pain that woke him from sleep earlier today.  He took 1 nitroglycerin.  It seems to recur a bit later he and he took another nitroglycerin.  On interview today and during acquisition of his EKG he was still having low-level angina at about 1 out of 10.  He does feel relatively better versus earlier today.  He has had no other concerning cardiovascular symptoms such as severe shortness of breath, nausea, or diaphoresis.  He tells me he is not really compliant with his amlodipine and he takes it only when he needs it which is on his days in the hospital. ? ?    ?  ?Previous Medical History: ?Past Medical History:  ?Diagnosis Date  ? Hyperlipidemia   ? NSTEMI (non-ST elevated myocardial infarction) (HCC)   ? ? ? ?Current Medications: ?Current Meds  ?Medication Sig  ? amLODipine (NORVASC) 5 MG tablet TAKE 1 TABLET BY MOUTH ONCE A DAY  ? aspirin EC 325 MG EC tablet Take 1 tablet (325 mg total) by mouth daily.  ? Blood Pressure Monitoring (OMRON 3 SERIES BP  MONITOR) DEVI Use as directed  ? Evolocumab 140 MG/ML SOAJ INJECT 1 DOSE INTO THE SKIN EVERY 14 DAYS  ? nitroGLYCERIN (NITROSTAT) 0.4 MG SL tablet Place 1 tablet under the tongue every 5 minutes as needed for chest pain.  ? omeprazole (PRILOSEC) 20 MG capsule TAKE 1 CAPSULE BY MOUTH DAILY.  ? rosuvastatin (CRESTOR) 5 MG tablet TAKE 1 TABLET BY MOUTH ONCE A DAY  ? zolpidem (AMBIEN) 5 MG tablet Take 1 tablet by mouth at bedtime as needed for insomnia.  ? [DISCONTINUED] albuterol (VENTOLIN HFA) 108 (90 Base) MCG/ACT inhaler Inhale 1-2 puffs into the lungs every 6 (six) hours as needed for wheezing or  shortness of breath.  ? [DISCONTINUED] predniSONE (STERAPRED UNI-PAK 21 TAB) 10 MG (21) TBPK tablet Take by mouth daily. Take 6 tabs by mouth daily  for 2 days, then 5 tabs for 2 days, then 4 tabs for 2 days, then 3 tabs for 2 days, 2 tabs for 2 days, then 1 tab by mouth daily for 2 days  ? [DISCONTINUED] zolpidem (AMBIEN) 5 MG tablet Take 1 tablet (5 mg total) by mouth at bedtime as needed for insomnia  ?  ? ?Allergies:    ?Atorvastatin, Crestor [rosuvastatin], and Penicillins  ? ?Social History:   ?Social History  ? ?Tobacco Use  ? Smoking status: Never  ? Smokeless tobacco: Never  ?Substance Use Topics  ? Alcohol use: Never  ? Drug use: Never  ?  ? ?Family Hx: ?Family History  ?Problem Relation Age of Onset  ? Asthma Mother   ? Hypertension Father   ? CAD Father   ? Hyperlipidemia Father   ?  ? ?Review of Systems:   ?Please see the history of present illness.    ?All other systems reviewed and are negative. ?  ? ? ?EKGs/Labs/Other Test Reviewed:   ? ?EKG: December 2022 EKG demonstrates sinus rhythm with rightward axis; EKG today demonstrates sinus rhythm with no ST changes or Q waves with new T wave inversion in V2 ? ?Prior CV studies: ? ?Cor angio 3/22 ?1.  Significant underlying three-vessel coronary artery disease with patent LIMA to LAD, patent free radial to ramus (this graft is attached to the proximal portion of the SVG to right PDA) and patent SVG to right PDA.  There is moderate disease in the SVG to right PDA at a site of a valve.  ?2.  Normal LV systolic function and normal left ventricular end-diastolic pressure. ?  ?Recommendations: ?Continue medical therapy. ?PCI of the SVG to right PDA requires a long stent with likely not durable results.  I favor continued medical therapy for now given that the stenosis does not appear to be obstructive.  If there is an issue with this graft in the future, intervening on the right PDA might yield better long-term results. ? ? ?ETT 1/22 ?The left ventricular  ejection fraction is mildly decreased (45-54%). ?Nuclear stress EF: 49%. ?Blood pressure demonstrated a normal response to exercise. ?Horizontal ST segment depression ST segment depression of 2 mm was noted during stress in the II, III, aVF, V5, V6 and V4 leads, and returning to baseline after 1-5 minutes of recovery. ?This is a low risk study. ?  ?Normal pefusion. LVEF 49% with septal dyskinesis (post-operative). Electrically abnormal treadmill test with up to 2 mm horizontal ST depression at peak exercise in II, III, AVF, V4-V6, however, exercise tolerance is excellent at 13.7 METS. Overall a low risk study. No prior for comparison. ? ?Imaging studies  that I have independently reviewed today: Cardiac catheterization, echocardiogram ? ?Recent Labs: ?No results found for requested labs within last 8760 hours.  ? ?Recent Lipid Panel ?Lab Results  ?Component Value Date/Time  ? CHOL 88 (L) 03/09/2021 12:37 PM  ? TRIG 238 (H) 03/09/2021 12:37 PM  ? HDL 43 03/09/2021 12:37 PM  ? LDLCALC 10 03/09/2021 12:37 PM  ? ? ?Risk Assessment/Calculations:   ? ? ?    ? ?Physical Exam:   ? ?VS:  BP 124/78   Pulse (!) 51   Ht 5\' 10"  (1.778 m)   Wt 175 lb (79.4 kg)   BMI 25.11 kg/m?    ?Wt Readings from Last 3 Encounters:  ?11/07/21 175 lb (79.4 kg)  ?07/19/21 177 lb (80.3 kg)  ?07/06/21 176 lb 1.9 oz (79.9 kg)  ?  ?GENERAL:  No apparent distress, AOx3 ?HEENT:  No carotid bruits, +2 carotid impulses, no scleral icterus ?CAR: RRR no murmurs, gallops, rubs, or thrills ?RES:  Clear to auscultation bilaterally ?ABD:  Soft, nontender, nondistended, positive bowel sounds x 4 ?VASC:  +2 right radial pulse and absent left radial pulse, +2 carotid pulses, palpable pedal pulses ?NEURO:  CN 2-12 grossly intact; motor and sensory grossly intact ?PSYCH:  No active depression or anxiety ?EXT:  No edema, ecchymosis, or cyanosis ? ?Signed, ?13/09/22, MD  ?11/07/2021 12:03 PM    ?[facility] ?[address] [place],  [place], [place]  [place] ?Phone: [phone]; Fax: [phone]  ? ?Note:  This document was prepared using Dragon voice recognition software and may include unintentional dictation errors. ?

## 2021-11-07 NOTE — Telephone Encounter (Signed)
Pt c/o of Chest Pain: STAT if CP now or developed within 24 hours ? ?1. Are you having CP right now? Yes off and on ? ?2. Are you experiencing any other symptoms (ex. SOB, nausea, vomiting, sweating)?  ? ?3. How long have you been experiencing CP? This morbing ? ?4. Is your CP continuous or coming and going? Coming and going ? ?5. Have you taken Nitroglycerin? Yes took 2 so far ?? ? ?

## 2021-11-07 NOTE — Telephone Encounter (Signed)
-----   Message from Orbie Pyo, MD sent at 11/07/2021  1:37 PM EDT ----- ?Reviewed with patient.  Will prescribe Imdur 30mg  QPM.  Dr. will arrange follow up. ?

## 2021-11-07 NOTE — Telephone Encounter (Signed)
Prescription sent into pharmacy on file, pt aware per Dr Lynnette Caffey. ?

## 2021-11-07 NOTE — Patient Instructions (Addendum)
Medication Instructions:  ?DECREASE Aspirin to 81mg  daily ? ?*If you need a refill on your cardiac medications before your next appointment, please call your pharmacy* ? ? ?Lab Work: ?High-sensitivity Troponin ?If you have labs (blood work) drawn today and your tests are completely normal, you will receive your results only by: ?MyChart Message (if you have MyChart) OR ?A paper copy in the mail ?If you have any lab test that is abnormal or we need to change your treatment, we will call you to review the results. ? ? ?Testing/Procedures: ?NONE ? ? ?Follow-Up: ?At [facility], you and your health needs are our priority.  As part of our continuing mission to provide you with exceptional heart care, we have created designated Provider Care Teams.  These Care Teams include your primary Cardiologist (physician) and Advanced Practice Providers (APPs -  Physician Assistants and Nurse Practitioners) who all work together to provide you with the care you need, when you need it. ? ? ?Provider:   ?[facility], MD ? ?Other Instructions ?If positive Troponin-admit to hospital  ?  ?

## 2021-11-09 ENCOUNTER — Other Ambulatory Visit (HOSPITAL_COMMUNITY): Payer: Self-pay

## 2021-11-09 DIAGNOSIS — J029 Acute pharyngitis, unspecified: Secondary | ICD-10-CM | POA: Diagnosis not present

## 2021-11-10 ENCOUNTER — Encounter: Payer: Self-pay | Admitting: *Deleted

## 2021-11-10 ENCOUNTER — Other Ambulatory Visit (HOSPITAL_COMMUNITY): Payer: Self-pay

## 2021-12-13 ENCOUNTER — Ambulatory Visit (INDEPENDENT_AMBULATORY_CARE_PROVIDER_SITE_OTHER): Payer: 59 | Admitting: Cardiovascular Disease

## 2021-12-13 VITALS — BP 116/84 | HR 48 | Ht 70.0 in | Wt 173.0 lb

## 2021-12-13 DIAGNOSIS — I25708 Atherosclerosis of coronary artery bypass graft(s), unspecified, with other forms of angina pectoris: Secondary | ICD-10-CM

## 2021-12-13 DIAGNOSIS — E785 Hyperlipidemia, unspecified: Secondary | ICD-10-CM | POA: Diagnosis not present

## 2021-12-13 DIAGNOSIS — I10 Essential (primary) hypertension: Secondary | ICD-10-CM | POA: Diagnosis not present

## 2021-12-13 DIAGNOSIS — I25700 Atherosclerosis of coronary artery bypass graft(s), unspecified, with unstable angina pectoris: Secondary | ICD-10-CM

## 2021-12-13 NOTE — Patient Instructions (Signed)
Medication Instructions:  ?STOP the Imdur ? ?*If you need a refill on your cardiac medications before your next appointment, please call your pharmacy* ? ? ?Lab Work: ?Your provider would like for you to return in July to have the following labs drawn: CBC, CMET and fasting Lipid. You do not need an appointment for the lab. Once in our office lobby there is a podium where you can sign in and ring the doorbell to alert [place] that you are here. The lab is open from 8:00 am to 4 pm; closed for lunch from 12:45pm-1:45pm. ? ?You may also go to any of these [facility] locations: ?  ?[place] ?- [address] ([facility]) ?- [address] ?- [address] ?  ?[place] ?- [address]  ?  ?[place]  ?- [address]  ?  ?[place] ?- [address] ?- [id number] ?  ?[place]  ?- [address] [place] ?- [address] ([facility] ? ?If you have labs (blood work) drawn today and your tests are completely normal, you will receive your results only by: ?MyChart Message (if you have MyChart) OR ?A paper copy in the mail ?If you have any lab test that is abnormal or we need to change your treatment, we will call you to review the results. ? ? ?Testing/Procedures: ?None ordered ? ? ?Follow-Up: ?At [facility], you and your health needs are our priority.  As part of our continuing mission to provide you with exceptional heart care, we have created designated Provider Care Teams.  These Care Teams include your primary Cardiologist (physician) and Advanced Practice Providers (APPs -  Physician Assistants and Nurse Practitioners) who all work together to provide you with the care you need, when you need it. ? ?We recommend signing up for the patient portal called "MyChart".  Sign up information is provided on this After Visit Summary.  MyChart is used to connect with patients for Virtual Visits (Telemedicine).   Patients are able to view lab/test results, encounter notes, upcoming appointments, etc.  Non-urgent messages can be sent to your provider as well.   ?To learn more about what you can do with MyChart, go to [email or web address].   ? ?Your next appointment:   ?6 month(s) ? ?The format for your next appointment:   ?In Person ? ?Provider:   ?Lorine Bears, MD { ? ? ? ?

## 2021-12-13 NOTE — Progress Notes (Signed)
?  ?Cardiology Office Note ? ? ?Date:  12/13/2021  ? ?IDWilley Due, DOB 1976/06/03, MRN [id number] ? ?PCP:  Iran Ouch, MD  ?Cardiologist:   Lorine Bears, MD  ? ?Chief Complaint  ?Patient presents with  ? Follow-up  ? Headache  ?  When taking Imdur.  ? Chest Pain  ? ? ? ?  ?History of Present Illness: ?Michael Franklin is a 46 y.o. male who presents for a follow-up visit regarding coronary artery disease status post CABG. ?He is one of our hospitalists.  He has known history of hyperlipidemia and family history of coronary artery disease.  His father had CABG in his 31s.  The patient has been very healthy throughout his life with excellent lifestyle and he plays soccer on a regular basis.  He did not tolerate atorvastatin in the past due to myalgia.  He presented in Oct 2021 with non-ST elevation myocardial infarction. EKG was unremarkable with the exception of possible septal Q waves.Echocardiogram showed an EF of 50 to 55% with mild mitral regurgitation.  Cardiac catheterization surprisingly showed severe three-vessel coronary artery disease with an occluded ostial LAD which was significantly calcified with collaterals noted from the right coronary artery, 95% stenosis in large ostial ramus and severe right PDA stenosis.  LVEDP was 24.  The patient underwent CABG by Dr. Cliffton Asters.  ?The patient was referred to Dr. Rennis Golden to evaluate for premature atherosclerosis and was found to have significantly elevated LP(a) elevation.  The patient has been tolerating small dose Crestor and was started on Repatha.  He had symptomatic PVCs that initially responded to treatment with Toprol but he developed bradycardia with fatigue and thus, Toprol was discontinued. ? ?He had recurrent chest pain since CABG with some intense episodes partially responded to nitroglycerin.  Repeat cardiac catheterization was done in March of 2022 which showed significant underlying three-vessel coronary artery disease with patent LIMA  to LAD, patent radial to ramus and patent SVG to right PDA.  There was moderate disease in the proximal portion of SVG to right PDA.  However, the right PDA itself was relatively supplying a small territory and the native RCA was not significantly diseased other than the right PDA.  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   He improved with colchicine. ? ?He continues to have intermittent intense episodes of chest pain mostly at rest and not with physical activities.  He continues to play soccer on a regular basis with no significant exertional symptoms.  He was recently seen by Dr. Roby Lofts for one of the chest pain episodes.  Troponin was normal and EKG showed no ischemic changes.  The patient was placed on Imdur but did not tolerate the medication due to intense headache.  He is feeling better now. ? ? ?Past Medical History:  ?Diagnosis Date  ? Hyperlipidemia   ? NSTEMI (non-ST elevated myocardial infarction) (HCC)   ? ? ?Past Surgical History:  ?Procedure Laterality Date  ? CORONARY ARTERY BYPASS GRAFT N/A 06/15/2020  ? Procedure: CORONARY ARTERY BYPASS GRAFTING (CABG) TIMES THREE USING HARVESTED LEFT RADIAL ARTERY, LEFT INTERNAL MAMMARY ARTERY,RIGHT GREATER SAPHENOUS VEIN,HARVESTED ENDOSCOPICALLY. Removal of balloon pump.;  Surgeon: Corliss Skains, MD;  Location: [facility] OR;  Service: Open Heart Surgery;  Laterality: N/A;  ? CORONARY/GRAFT ACUTE MI REVASCULARIZATION N/A 06/14/2020  ? Procedure: Coronary/Graft Acute MI Revascularization;  Surgeon: Iran Ouch, MD;  Location: [facility] INVASIVE CV LAB;  Service: Cardiovascular;  Laterality: N/A;  ?  IABP INSERTION N/A 06/15/2020  ? Procedure: IABP Insertion;  Surgeon: Iran Ouch, MD;  Location: [facility] INVASIVE CV LAB;  Service: Cardiovascular;  Laterality: N/A;  ? LEFT HEART CATH AND CORONARY ANGIOGRAPHY N/A 06/14/2020  ? Procedure: LEFT HEART CATH AND CORONARY ANGIOGRAPHY;  Surgeon: Iran Ouch, MD;  Location: [facility]  INVASIVE CV LAB;  Service: Cardiovascular;  Laterality: N/A;  ? LEFT HEART CATH AND CORS/GRAFTS ANGIOGRAPHY N/A 11/05/2020  ? Procedure: LEFT HEART CATH AND CORS/GRAFTS ANGIOGRAPHY;  Surgeon: Iran Ouch, MD;  Location: [facility] INVASIVE CV LAB;  Service: Cardiovascular;  Laterality: N/A;  ? TEE WITHOUT CARDIOVERSION N/A 06/15/2020  ? Procedure: TRANSESOPHAGEAL ECHOCARDIOGRAM (TEE);  Surgeon: Corliss Skains, MD;  Location: [facility] OR;  Service: Open Heart Surgery;  Laterality: N/A;  ? ? ? ?Current Outpatient Medications  ?Medication Sig Dispense Refill  ? amLODipine (NORVASC) 5 MG tablet TAKE 1 TABLET BY MOUTH ONCE A DAY 90 tablet 3  ? aspirin EC 81 MG tablet Take 1 tablet (81 mg total) by mouth daily. Swallow whole. 90 tablet 3  ? Blood Pressure Monitoring (OMRON 3 SERIES BP MONITOR) DEVI Use as directed 1 each 0  ? Evolocumab 140 MG/ML SOAJ INJECT 1 DOSE INTO THE SKIN EVERY 14 DAYS 2 mL 11  ? isosorbide mononitrate (IMDUR) 30 MG 24 hr tablet Take 1 tablet by mouth every evening. 90 tablet 3  ? nitroGLYCERIN (NITROSTAT) 0.4 MG SL tablet Place 1 tablet under the tongue every 5 minutes as needed for chest pain. 25 tablet 6  ? omeprazole (PRILOSEC) 20 MG capsule TAKE 1 CAPSULE BY MOUTH DAILY. 90 capsule 3  ? rosuvastatin (CRESTOR) 5 MG tablet TAKE 1 TABLET BY MOUTH ONCE A DAY 90 tablet 3  ? zolpidem (AMBIEN) 5 MG tablet Take 1 tablet by mouth at bedtime as needed for insomnia. 30 tablet 4  ? ?No current facility-administered medications for this visit.  ? ? ?Allergies:   Atorvastatin, Crestor [rosuvastatin], and Penicillins  ? ? ?Social History:  The patient  reports that he has never smoked. He has never used smokeless tobacco. He reports that he does not drink alcohol and does not use drugs.  ? ?Family History:  The patient's family history includes Asthma in his mother; CAD in his father; Hyperlipidemia in his father; Hypertension in his father.  ? ? ?ROS:  Please see the history of present illness.   Otherwise,  review of systems are positive for none.   All other systems are reviewed and negative.  ? ? ?PHYSICAL EXAM: ?VS:  BP 116/84 (BP Location: Left Arm, Patient Position: Sitting, Cuff Size: Normal)   Pulse (!) 48   Ht 5\' 10"  (1.778 m)   Wt 173 lb (78.5 kg)   BMI 24.82 kg/m?  , BMI Body mass index is 24.82 kg/m?. ?GEN: Well nourished, well developed, in no acute distress  ?HEENT: normal  ?Neck: no JVD, carotid bruits, or masses ?Cardiac: RRR; no murmurs, rubs, or gallops,no edema  ?Respiratory:  clear to auscultation bilaterally, normal work of breathing ?GI: soft, nontender, nondistended, + BS ?MS: no deformity or atrophy  ?Skin: warm and dry, no rash ?Neuro:  Strength and sensation are intact ?Psych: euthymic mood, full affect ? ? ?EKG:  EKG is ordered today. ?The ekg ordered today demonstrates sinus bradycardia with possible biatrial enlargement.  No significant ST changes. ? ? ?Recent Labs: ?No results found for requested labs within last 8760 hours.  ? ? ?Lipid Panel ?   ?Component Value  Date/Time  ? CHOL 88 (L) 03/09/2021 1237  ? TRIG 238 (H) 03/09/2021 1237  ? HDL 43 03/09/2021 1237  ? CHOLHDL 2.0 03/09/2021 1237  ? CHOLHDL 4.5 06/14/2020 1729  ? VLDL 13 06/14/2020 1729  ? LDLCALC 10 03/09/2021 1237  ? ?  ? ?Wt Readings from Last 3 Encounters:  ?12/13/21 173 lb (78.5 kg)  ?11/07/21 175 lb (79.4 kg)  ?07/19/21 177 lb (80.3 kg)  ?  ? ? ? ?   ? View : No data to display.  ?  ?  ?  ? ? ? ? ?ASSESSMENT AND PLAN: ? ?1.  Coronary artery disease involving native coronary arteries with other forms of angina: It is hard to understand the exact etiology for his intermittent episodes of chest pain at rest but not with exertional symptoms.  Some of his episodes respond to sublingual nitroglycerin.  Coronary spasm is a possibility.  However, most of these episodes seem to be triggered by stress and anxiety.  He did not tolerate Imdur due to headache which was discontinued today.   ?He is due for routine labs which were  requested today. ? ?2.  Hyperlipidemia: Continue treatment with small dose rosuvastatin and Repatha.  I requested lipid and liver profile. ? ?3.  Essential hypertension: Continue treatment with amlodipine. ? ?

## 2021-12-26 ENCOUNTER — Encounter: Payer: Self-pay | Admitting: *Deleted

## 2021-12-26 DIAGNOSIS — Z006 Encounter for examination for normal comparison and control in clinical research program: Secondary | ICD-10-CM

## 2021-12-26 NOTE — Research (Signed)
I called patient to follow-up with the [name] Study.We talked about the study and I set up an appointment on May 16,2023 at 8:00am. Patient will need to fast for the appointment. I will send patient copy of consent for his review. ?

## 2021-12-30 IMAGING — CR DG CHEST 2V
2 series · 2 of 2 positions shown · non-contrast
Comparison: 09/08/2020

CLINICAL DATA: Chest pain.  History of CABG.

EXAM:
CHEST - 2 VIEW

[chest pa]
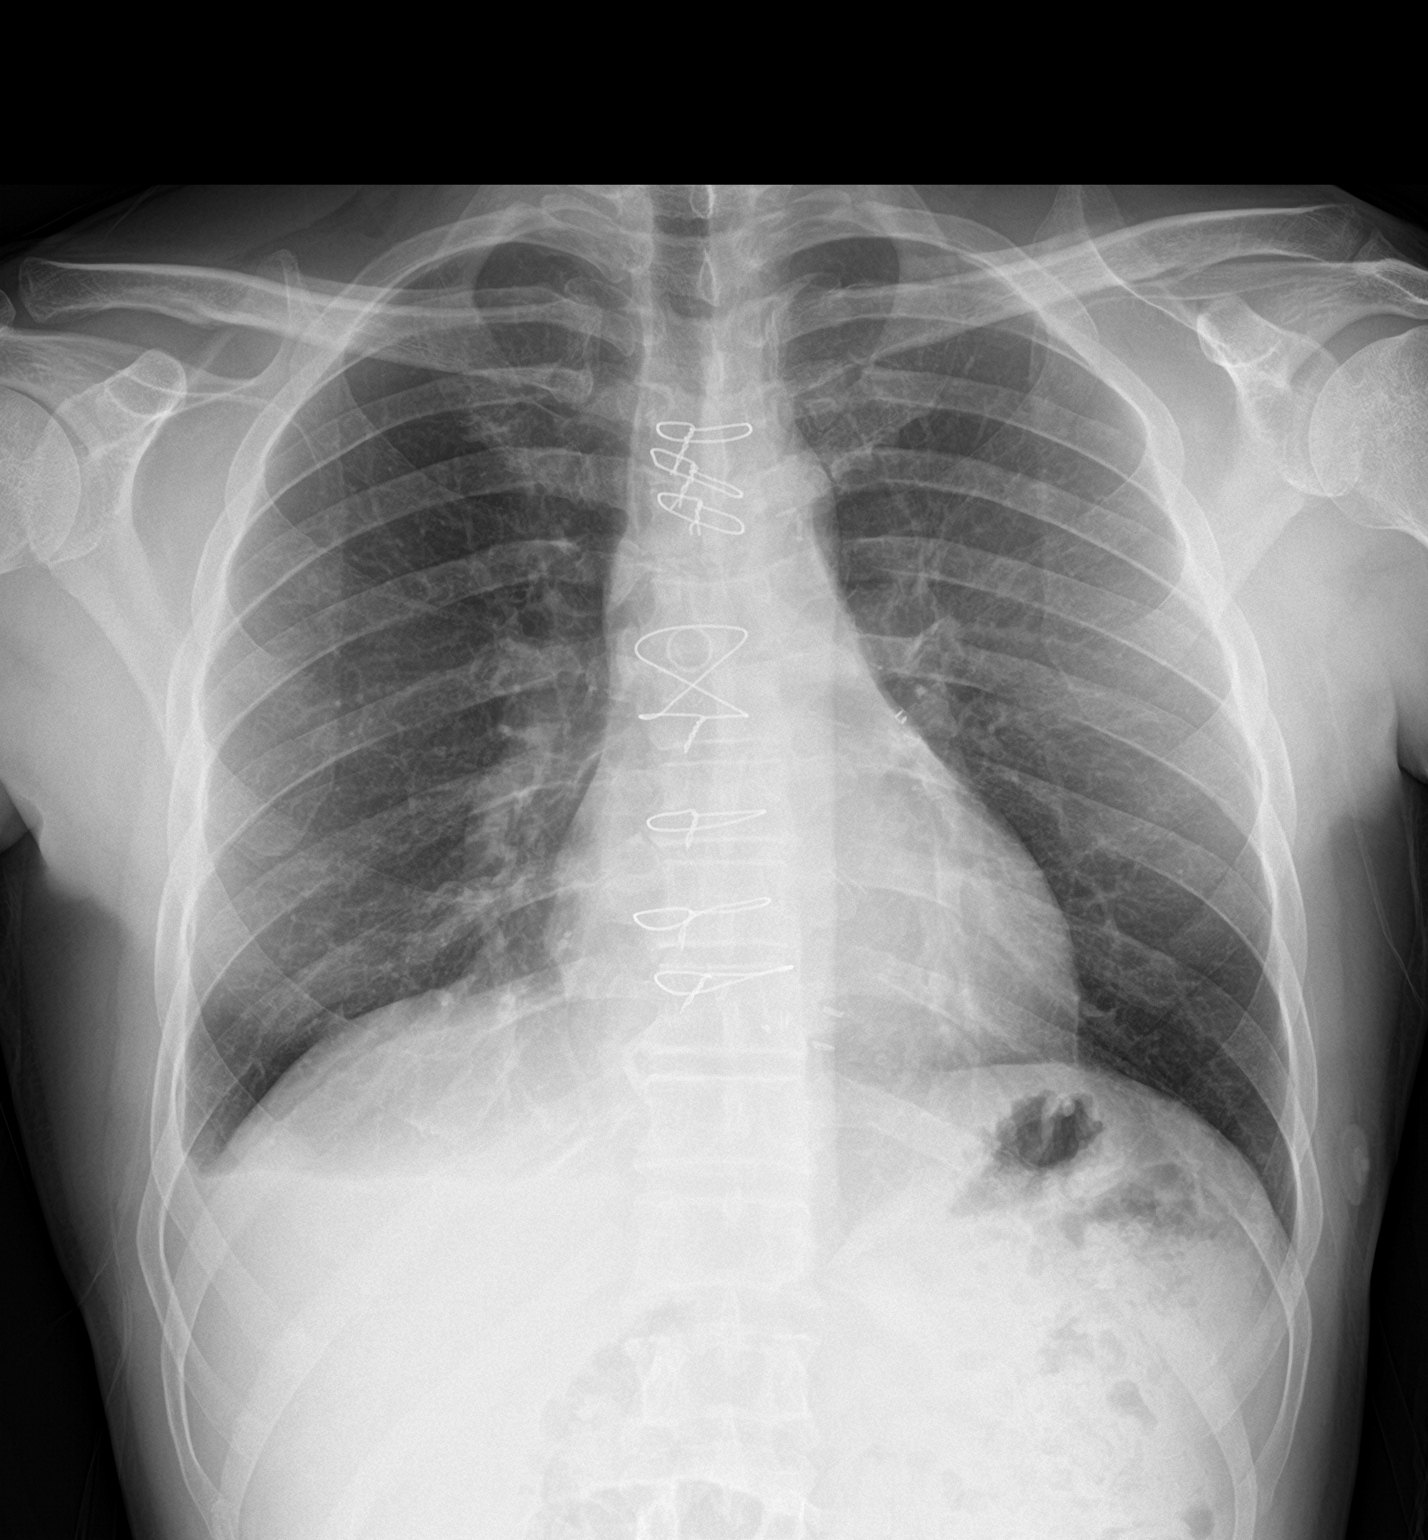

[chest lat]
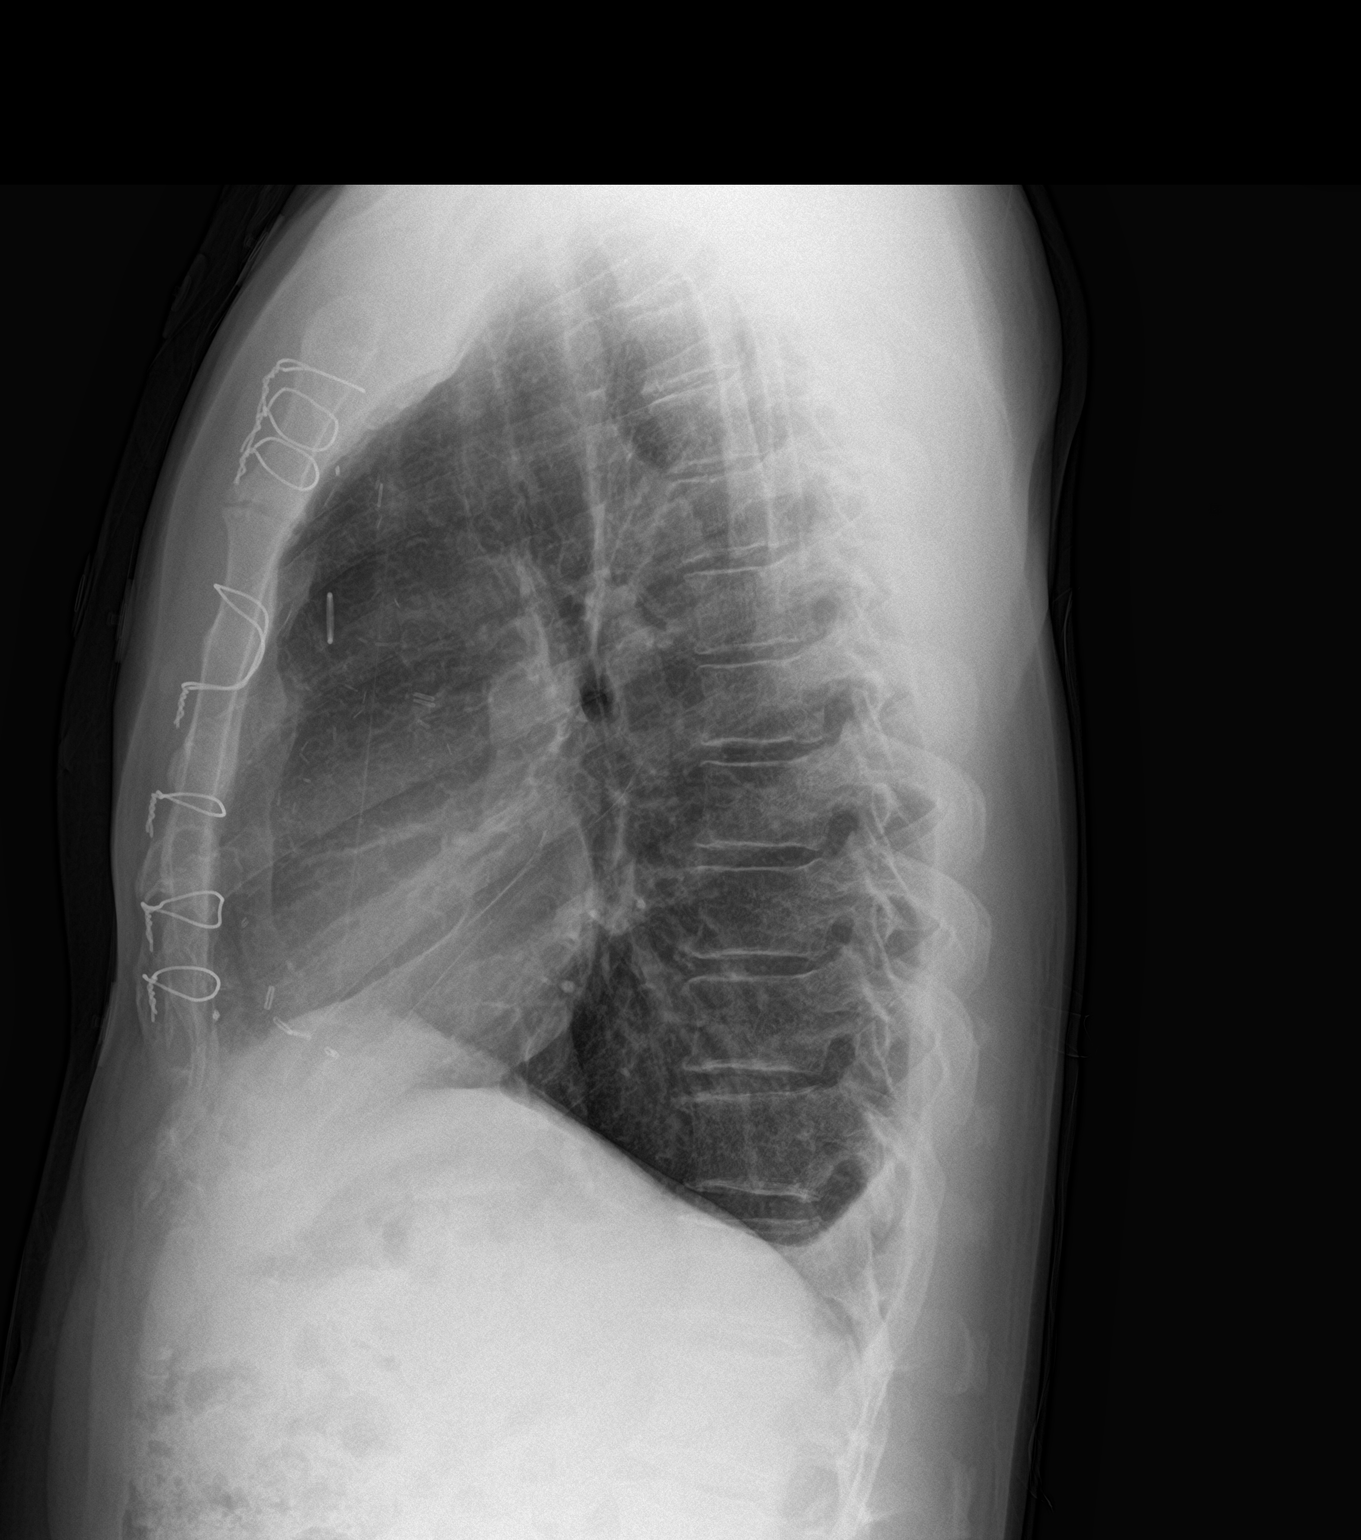

[2 of 2 positions shown; findings below may reference images not displayed]

FINDINGS: Trace right pleural effusion, also seen previously. The lungs are
clear. No pneumothorax. Normal heart size and mediastinal contours.
CABG
IMPRESSION: No acute finding.

Trace right pleural effusion or scarring.

## 2022-01-04 ENCOUNTER — Other Ambulatory Visit (HOSPITAL_COMMUNITY): Payer: Self-pay

## 2022-01-04 MED ORDER — DOXYCYCLINE HYCLATE 100 MG PO CAPS
ORAL_CAPSULE | ORAL | 0 refills | Status: DC
  Filled 2022-01-04: qty 20, 10d supply, fill #0

## 2022-01-09 ENCOUNTER — Encounter: Payer: Self-pay | Admitting: *Deleted

## 2022-01-09 DIAGNOSIS — Z006 Encounter for examination for normal comparison and control in clinical research program: Secondary | ICD-10-CM

## 2022-01-09 NOTE — Research (Signed)
Spoke with Dr Flossie Dibble to inform him of his appointment tomorrow at 0800., given parking code and to be NPO. Voices understanding.  ?

## 2022-01-10 ENCOUNTER — Other Ambulatory Visit: Payer: Self-pay

## 2022-01-10 ENCOUNTER — Encounter: Payer: 59 | Admitting: *Deleted

## 2022-01-10 VITALS — BP 109/71 | HR 52 | Temp 97.8°F | Resp 18 | Ht 70.0 in | Wt 172.0 lb

## 2022-01-10 DIAGNOSIS — E785 Hyperlipidemia, unspecified: Secondary | ICD-10-CM | POA: Diagnosis not present

## 2022-01-10 DIAGNOSIS — Z006 Encounter for examination for normal comparison and control in clinical research program: Secondary | ICD-10-CM

## 2022-01-10 DIAGNOSIS — I25700 Atherosclerosis of coronary artery bypass graft(s), unspecified, with unstable angina pectoris: Secondary | ICD-10-CM | POA: Diagnosis not present

## 2022-01-10 DIAGNOSIS — I10 Essential (primary) hypertension: Secondary | ICD-10-CM | POA: Diagnosis not present

## 2022-01-10 LAB — COMPREHENSIVE METABOLIC PANEL
ALT: 20 IU/L (ref 0–44)
AST: 30 IU/L (ref 0–40)
Albumin/Globulin Ratio: 2 (ref 1.2–2.2)
Albumin: 4.5 g/dL (ref 4.0–5.0)
Alkaline Phosphatase: 83 IU/L (ref 44–121)
BUN/Creatinine Ratio: 19 (ref 9–20)
BUN: 20 mg/dL (ref 6–24)
Bilirubin Total: 0.3 mg/dL (ref 0.0–1.2)
CO2: 24 mmol/L (ref 20–29)
Calcium: 9 mg/dL (ref 8.7–10.2)
Chloride: 106 mmol/L (ref 96–106)
Creatinine, Ser: 1.08 mg/dL (ref 0.76–1.27)
Globulin, Total: 2.2 g/dL (ref 1.5–4.5)
Glucose: 106 mg/dL — ABNORMAL HIGH (ref 70–99)
Potassium: 4.7 mmol/L (ref 3.5–5.2)
Sodium: 142 mmol/L (ref 134–144)
Total Protein: 6.7 g/dL (ref 6.0–8.5)
eGFR: 85 mL/min/{1.73_m2} (ref >59)

## 2022-01-10 LAB — PSA: Prostate Specific Ag, Serum: 0.5 ng/mL (ref 0.0–4.0)

## 2022-01-10 LAB — CBC
Hematocrit: 42 % (ref 37.5–51.0)
Hemoglobin: 14.1 g/dL (ref 13.0–17.7)
MCH: 28.4 pg (ref 26.6–33.0)
MCHC: 33.6 g/dL (ref 31.5–35.7)
MCV: 85 fL (ref 79–97)
Platelets: 208 10*3/uL (ref 150–450)
RBC: 4.96 x10E6/uL (ref 4.14–5.80)
RDW: 13.7 % (ref 11.6–15.4)
WBC: 4.8 10*3/uL (ref 3.4–10.8)

## 2022-01-10 LAB — LIPID PANEL
Chol/HDL Ratio: 1.9 ratio (ref 0.0–5.0)
Cholesterol, Total: 89 mg/dL — ABNORMAL LOW (ref 100–199)
HDL: 47 mg/dL (ref >39)
LDL Chol Calc (NIH): 21 mg/dL (ref 0–99)
Triglycerides: 115 mg/dL (ref 0–149)
VLDL Cholesterol Cal: 21 mg/dL (ref 5–40)

## 2022-01-10 LAB — TSH: TSH: 1.64 u[IU]/mL (ref 0.450–4.500)

## 2022-01-10 NOTE — Research (Addendum)
Michael Franklin run in visit          [facility] Abnormal Lab report 10-Jan-2022  Chemistry: Creatine Kinase (CK) 382 U/L     _0 Clinically Significant  _1 Not Clinically Significant Gamma Glutamyl Transfers (GGT) 7 U/L  _2 Clinically Significant  _3 Not Clinically Significant   Lipids:  Triglyceride        140  mg/dL                  _4 Clinically Significant  _5 Not Clinically Significant Total cholesterol 91 mg/dL                     _6 Clinically Significant  _7 Not Clinically Significant Apolipoprotein B  36.50 mg/dL               _8 Clinically Significant  _9 Not Clinically Significant Non-HDL Cholesterol (calc)  40 mg/dL   _10 Clinically Significant  _11 Not Clinically Significant   Any further action needed to be taken per the PI?  YES  Patient will not qualify for the study - trigs are low. CK is elevated - check to see if patient is having leg pain - may need to stop his statin.  [name], MD, [facility], [place], [name] Director of the Advanced Lipid Disorders &  Cardiovascular Risk Reduction Clinic Diplomate of the American Board of Clinical Lipidology Attending Cardiologist  Direct Dial: [phone]  Fax: [phone]  Website:  [email or web address]    __________________________________________________________________________________________________________________________________________________________________  [facility] Consent   Subject Name: Michael Franklin  Subject met inclusion and exclusion criteria.  The informed consent form, study requirements and expectations were reviewed with the subject and questions and concerns were addressed prior to the signing of the consent form.  The subject verbalized understanding of the trial requirements.  The subject agreed to participate in the Core  trial and signed the informed consent at [place] on 10-Jan-2022.  The informed consent was obtained prior to performance of any protocol-specific  procedures for the subject.  A copy of the signed informed consent was given to the subject and a copy was placed in the subject's medical record.   [name]  Protocol number 1 Consent version  2   [place]  [phone]    [id number]  SUBJECT ID:   [id number]                        DATE:   10-Jan-2022       _12  MALE                            _13  MALE AGE:46 ETHINICITY:   _14  HISPANIC/LATINO      _15  NON- HISPANIC/LATINO RACE:             _16   WHITE             _17  BLACK/AFRICAN AMERICAN                         _18   ASIAN              _19  AMERICAN INDIAN/[place] NATIVE                         _20   NATIVE [place]/OTHER PACIFIC ISLANDER                         [  x]  OTHER  FUTURE RESEARCH _0  USE OF SAMPLES FOR FUTURE RESEARCH _1  Consented for Sub study CTA   INCLUSION CRITERIA Consent      _2    Pregnancy authorization _3   AGE 63 or greater _4   Triglycerides fasting 150 or greater with either: _5   Dx of ASCVD (CAD, CVA, PAD) OR _6   Increased risk for ASCVD as below _7   Type 2 DM OR 2 or more below _8   Men 36 or greater Woman 73 or greater _9  _10    Woman with Hx of preeclampsia or premature menopause (before 105) _11    Family Hx of premature ASCDD (Before 51 for males, or before 43 for females  _12    Current Tobacco use  <MWNUUVOZDGUYQIHK>_7<\/QQVZDGLOVFIEPPIR>_51    Metabolic syndrome <OACZYSAYTKZSWFUX>_3<\/ATFTDDUKGURKYHCW>_23    Hypertension with Treatment  _15    CKD stage 3 Or (GFR 30-59)  _16    LDL-C 160 or greater LDL-C 100 or greater on therapy to lower  _17    Elevated high-sensitivity C Reactive protein (>2.0)  _18   Elevated lipoprotein (a) (>14m/dL or 124nmol/L) OR  _19   Triglycerides fasting 500 or greater  _20   Lipid-lowering med (for at least 4 weeks) [place] to comply with diet and lifestyle recommendations  _21   Females must be non-pregnant and non-lactating and EITHER  _22   Surgically sterile, post-menopausal, abstinent OR Use highly effective contraceptive at time of consent until at least 30 weeks after last dose of study drug  _23    Males must be surgical sterile, abstinent or using a highly effective contraceptive at time of consent until at least 30 weeks after the last dose of study drug   _24     EXCLUSION CRITERIA           N/A                                            _25  Major surgery, peripheral revascularization, or non-urgent PCI within 3 months prior to screening, or planned major surgery or major procedure during the study _26   Active pancreatitis within 4 weeks prior to screening _27   Acute coronary syndrome or CVA/TIA within 3 months of screening _28   Screening labs: ALT or AST >3.0 x ULN Total bilirubin >1.5 ULN unless due to Gilbert's syndrome GFR >30 Urine Protein/creatine ratio >500 Uncontrolled HTN (BP>180/100 despite Treatment Uncontrolled hypothyroidism TSH>1.5 and T4 < LLN, or Hormone therapy not stable for 4 weeks or greater  _29  _30  _31  _32  _33  _34   DM newly dx within 12 weeks of screening A1c > 9.5 at screening  _35  _36   Change in basal insulin >20% within 3 months prior to screening  _37   Type 1 Dm: episode of DKA or > 3 episodes of severe hypo glycerides with on 6 months prior to screening   Active infections, HIV, Hep C, Hep B _38   Active infection requiring systemic antiviral or antimicrobial tx that will not be complete prior to study day 1 or active Covid 19 infection not resolved by study day 1 _39   Malignancy within 5 years (except for non-melanoma skin ca, cervical in situ ca, breast ductal ca in situ or stage 1 prostate Ca that has been tx _40   Hypersensitivity to the active substance (olezarsen or placebo) _41   Tx with another investigational drug or devise within 1 month or screening  _42   Previous tx with an oligonucleotide within 4 months of screening _43   Con meds/ procedure restrictions:   Systemic corticosteroids of anabolic steroids within 6 weeks prior to screening and during the study unless approved   _0   Use of bile acids resins (colestipol or Colesevelam) within 4 weeks prior to  screening or planned during the study  _1   Plasma apheresis within 4 weeks prior to screening or planned during the study  _2   Change in meds known to exacerbate hypertriglyceridemia (beta blockers, thiazides, isotretinoin, oral antidiabetic meds, tamoxifen, estrogens or progestins within 4 weeks prior to screening  _3    Change or expected need for significant change in titration of therapies known to significantly reduce TG (GLP-1 agonists, other incretin mimetics, Phentermine/topiramate, naltrexone/bupropion, Xenical, or bariatric surgery within 3 months prior to screening  _4    Change in antipsychotic meds within 30 days of screening or >437m within 60 days of Screening  _5    Blood or plasma donation of 50-4975mwithin 30 days of screening or>499 within 60 days of screening _6   Unwilling to comply with procedures, following up, or unwillingness to cooperate fully with the investigator _7   ETOH abuse or recent (<1 year) or other substance abuse _8    Dr ShRoger Shelterere for Run in screening visit for [place] research study. Reports no ED, Urgent care, or PCP visits. No abd pain or any other pain. He reports that he doesn't plan on having any more children. Spoke to him about using birth control during the study. He has a history of CABG so he is excluded from the sub study. Medications reviewed states no changes in his meds. Time given to ask questions before signing consent at 0815. Vs taken at 0830. Blood work at 08Cablevision SystemsUrine obtained at 0936. EKG at 0842. Dr StLia Foyerompleted exam.  Current Outpatient Medications:    amLODipine (NORVASC) 5 MG tablet, TAKE 1 TABLET BY MOUTH ONCE A DAY, Disp: 90 tablet, Rfl: 3   aspirin EC 81 MG tablet, Take 1 tablet (81 mg total) by mouth daily. Swallow whole., Disp: 90 tablet, Rfl: 3   doxycycline (VIBRAMYCIN) 100 MG capsule, Take 1 capsule by mouth 2 times a day for 10 days, Disp: 20 capsule, Rfl: 0   Evolocumab 140 MG/ML SOAJ, INJECT 1 DOSE INTO THE SKIN  EVERY 14 DAYS, Disp: 2 mL, Rfl: 11   nitroGLYCERIN (NITROSTAT) 0.4 MG SL tablet, Place 1 tablet under the tongue every 5 minutes as needed for chest pain., Disp: 25 tablet, Rfl: 6   omeprazole (PRILOSEC) 20 MG capsule, TAKE 1 CAPSULE BY MOUTH DAILY., Disp: 90 capsule, Rfl: 3   rosuvastatin (CRESTOR) 5 MG tablet, TAKE 1 TABLET BY MOUTH ONCE A DAY, Disp: 90 tablet, Rfl: 3   zolpidem (AMBIEN) 5 MG tablet, Take 1 tablet by mouth at bedtime as needed for insomnia., Disp: 30 tablet, Rfl: 4   Blood Pressure Monitoring (OMRON 3 SERIES BP MONITOR) DEVI, Use as directed, Disp: 1 each, Rfl: 0

## 2022-01-11 NOTE — Progress Notes (Signed)
Patient seen for possible inclusion in the [name] trial.  Prior lipids reviewed in detail.  Patient is currently on Repatha, having had significant side effects from statin therapy.  He reviewed and I entirely reviewed his history.  Complicated presentation requiring IABP and emergent transfer with emergent CABG.  Has done well except as noted.  His Lp(a) has been intermittently very high, but not currently in last test.  Previously triglycerides normal, then qualitying.  ?Investigational product, purpose, intended goal of study reviewed with patient in detail.  He is amenable to enrollment. He is very active, plays soccer in an adult leagure.   ? ?P 52 BP 109/71 Afebrile RR normal ?NO JVD ?Lungs clear ?Cor regular ?Abd - soft with obvious masses ?Extremity no edema ? ?ECG Rightward axis deviation ? ?Medication, primary objectives, known risks, all reviewed with patient in detail.  He wishes to proceed.  He and spouse are not anticipating any more children.   ? ?Herby Abraham, MD [facility], FSCAI ?Medical Director, [facility].   ?

## 2022-01-12 ENCOUNTER — Encounter: Payer: Self-pay | Admitting: Orthopedic Surgery

## 2022-01-12 ENCOUNTER — Ambulatory Visit (INDEPENDENT_AMBULATORY_CARE_PROVIDER_SITE_OTHER): Payer: 59 | Admitting: Orthopedic Surgery

## 2022-01-12 ENCOUNTER — Encounter: Payer: Self-pay | Admitting: *Deleted

## 2022-01-12 DIAGNOSIS — Z006 Encounter for examination for normal comparison and control in clinical research program: Secondary | ICD-10-CM

## 2022-01-12 DIAGNOSIS — M654 Radial styloid tenosynovitis [de Quervain]: Secondary | ICD-10-CM | POA: Diagnosis not present

## 2022-01-12 MED ORDER — LIDOCAINE HCL 1 % IJ SOLN
1.0000 mL | INTRAMUSCULAR | Status: AC | PRN
  Administered 2022-01-12: 1 mL

## 2022-01-12 MED ORDER — BETAMETHASONE SOD PHOS & ACET 6 (3-3) MG/ML IJ SUSP
6.0000 mg | INTRAMUSCULAR | Status: AC | PRN
  Administered 2022-01-12: 6 mg via INTRA_ARTICULAR

## 2022-01-12 NOTE — Research (Signed)
Spoke With Dr Flossie Dibble he states he does have leg pain at times, but that he is very active. He feels it more related to that. Encouraged him to reach out to discuss with Dr Rennis Golden.

## 2022-01-12 NOTE — Research (Signed)
Message left for Dr Flossie Dibble to call me back about [name] research study. Triglycerides were 140. Also asked if he is having leg pain?

## 2022-01-12 NOTE — Progress Notes (Signed)
Office Visit Note   Patient: Michael Franklin           Date of Birth: December 11, 1975           MRN: [id number] Visit Date: 01/12/2022              Requested by: Wellington Hampshire, MD [address] [place],  [place] [address] PCP: Wellington Hampshire, MD   Assessment & Plan: Visit Diagnoses:  1. Tenosynovitis, de Quervain     Plan: Patient presents with return of his left de Quervain's tenosynovitis symptoms.  He got 7 or 8 months of relief following a CSI into the first dorsal compartment.  He would like a repeat injection today.   Follow-Up Instructions: No follow-ups on file.   Orders:  No orders of the defined types were placed in this encounter.  No orders of the defined types were placed in this encounter.     Procedures: Hand/UE Inj: L extensor compartment 1 for de Quervain's tenosynovitis on 01/12/2022 11:44 AM Indications: tendon swelling and therapeutic Details: 25 G needle, radial approach Medications: 1 mL lidocaine 1 %; 6 mg betamethasone acetate-betamethasone sodium phosphate 6 (3-3) MG/ML Procedure, treatment alternatives, risks and benefits explained, specific risks discussed. Consent was given by the patient. Immediately prior to procedure a time out was called to verify the correct patient, procedure, equipment, support staff and site/side marked as required. Patient was prepped and draped in the usual sterile fashion.      Clinical Data: No additional findings.   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  He was last seen on 05/05/2021 at which point he underwent corticosteroid junction in the left first dorsal compartment.  He had significant symptom relief with resolution of his symptoms for 7 or 8 months now.  The pain has since returned.  It is worse with activities that involve motion of the thumb.  He is in a soccer  league and has noted that he has been hit on the radial aspect of his wrist several times recently.  He is wearing his wrist brace with activity and while playing soccer.  He would like a repeat corticosteroid injection today.   Review of Systems   Objective: Vital Signs: There were no vitals taken for this visit.  Physical Exam Constitutional:      Appearance: Normal appearance.  Cardiovascular:     Rate and Rhythm: Normal rate.     Pulses: Normal pulses.  Pulmonary:     Effort: Pulmonary effort is normal.  Skin:    General: Skin is warm and dry.     Capillary Refill: Capillary refill takes less than 2 seconds.  Neurological:     Mental Status: He is alert.    Left Hand Exam   Tenderness  Left hand tenderness location: TTP over radial styloid and first dorsal compartment.   Range of Motion  The patient has normal left wrist ROM.  Tests  Finkelstein's test: positive  Other  Erythema: absent Sensation: normal Pulse: present     Specialty Comments:  No specialty comments available.  Imaging: No results found.   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   Chest pain 06/14/2020   Non-ST elevation (NSTEMI) myocardial infarction [facility])    Past Medical History:  Diagnosis Date   Hyperlipidemia    NSTEMI (non-ST elevated myocardial infarction) ([facility])     Family History  Problem Relation Age of Onset   Asthma Mother    Hypertension Father    CAD Father    Hyperlipidemia Father     Past Surgical History:  Procedure Laterality Date   CORONARY ARTERY BYPASS GRAFT N/A 06/15/2020   Procedure: CORONARY ARTERY BYPASS GRAFTING (CABG) TIMES THREE USING HARVESTED LEFT RADIAL ARTERY, LEFT INTERNAL MAMMARY ARTERY,RIGHT GREATER SAPHENOUS  VEIN,HARVESTED ENDOSCOPICALLY. Removal of balloon pump.;  Surgeon: Lajuana Matte, MD;  Location: [facility];  Service: Open Heart Surgery;  Laterality: N/A;   CORONARY/GRAFT ACUTE MI REVASCULARIZATION N/A 06/14/2020   Procedure: Coronary/Graft Acute MI Revascularization;  Surgeon: Wellington Hampshire, MD;  Location: [facility];  Service: Cardiovascular;  Laterality: N/A;   IABP INSERTION N/A 06/15/2020   Procedure: IABP Insertion;  Surgeon: Wellington Hampshire, MD;  Location: [facility];  Service: Cardiovascular;  Laterality: N/A;   LEFT HEART CATH AND CORONARY ANGIOGRAPHY N/A 06/14/2020   Procedure: LEFT HEART CATH AND CORONARY ANGIOGRAPHY;  Surgeon: Wellington Hampshire, MD;  Location: [facility];  Service: Cardiovascular;  Laterality: N/A;   LEFT HEART CATH AND CORS/GRAFTS ANGIOGRAPHY N/A 11/05/2020   Procedure: LEFT HEART CATH AND CORS/GRAFTS ANGIOGRAPHY;  Surgeon: Wellington Hampshire, MD;  Location: [facility];  Service: Cardiovascular;  Laterality: N/A;   TEE WITHOUT CARDIOVERSION N/A 06/15/2020   Procedure: TRANSESOPHAGEAL ECHOCARDIOGRAM (TEE);  Surgeon: Lajuana Matte, MD;  Location: [facility];  Service: Open Heart Surgery;  Laterality: N/A;   Social History   Occupational History   Not on file  Tobacco Use   Smoking status: Never   Smokeless tobacco: Never  Substance and Sexual Activity   Alcohol use: Never   Drug use: Never   Sexual activity: Not on file

## 2022-01-13 NOTE — Research (Signed)
[name] Screening Run-in Visit [phone]

## 2022-01-17 ENCOUNTER — Encounter: Payer: Self-pay | Admitting: *Deleted

## 2022-01-17 DIAGNOSIS — Z006 Encounter for examination for normal comparison and control in clinical research program: Secondary | ICD-10-CM

## 2022-01-17 NOTE — Research (Signed)
Message left for Dr Roger Shelter about LP(a) results. Encouraged him to call me back.

## 2022-01-19 ENCOUNTER — Other Ambulatory Visit (HOSPITAL_COMMUNITY): Payer: Self-pay

## 2022-01-19 MED ORDER — METHYLPREDNISOLONE 4 MG PO TBPK
ORAL_TABLET | ORAL | 1 refills | Status: DC
  Filled 2022-01-19: qty 21, 6d supply, fill #0

## 2022-01-19 NOTE — Research (Signed)
[name] Screening Run in 10-Jan-2022

## 2022-01-27 NOTE — Research (Cosign Needed Addendum)
  [name] Screening Run in 10-Jan-2022    Apolipoprotein B48   2.23 mg/dL    [] Clinically Significant  [x] Not Clinically Significant  Chrystie Nose, MD, [facility], FACP  [place]  [facility]  Medical Director of the Advanced Lipid Disorders &  Cardiovascular Risk Reduction Clinic Diplomate of the American Board of Clinical Lipidology Attending Cardiologist  Direct Dial: [phone]  Fax: [phone]  Website:  [email or web address]      Review of medications: Takes Norvasc for BP started 06-15-20 ASA for heart protections started 06-14-20 Evolocumab for hyperlipidemia started 07-28-20 Nitro as needed for chest pain started 06-14-20 Prilosec for Acid reflux started 09-08-20 Crestor for hyper lipidemia started 06-21-20 Ambien for sleep started  09-08-20

## 2022-02-09 ENCOUNTER — Other Ambulatory Visit (HOSPITAL_COMMUNITY): Payer: Self-pay

## 2022-03-10 ENCOUNTER — Other Ambulatory Visit: Payer: Self-pay | Admitting: Cardiovascular Disease

## 2022-03-10 ENCOUNTER — Other Ambulatory Visit: Payer: Self-pay | Admitting: Internal Medicine

## 2022-03-10 ENCOUNTER — Other Ambulatory Visit (HOSPITAL_COMMUNITY): Payer: Self-pay

## 2022-03-10 NOTE — Telephone Encounter (Signed)
Refill Request.  

## 2022-03-13 ENCOUNTER — Telehealth: Payer: Self-pay | Admitting: Internal Medicine

## 2022-03-13 ENCOUNTER — Other Ambulatory Visit (HOSPITAL_COMMUNITY): Payer: Self-pay

## 2022-03-13 NOTE — Telephone Encounter (Signed)
Spoke with the patient. He stated that he needs a refill of the Repatha. This was taken off of list. Will route to the provider and nurse.

## 2022-03-13 NOTE — Telephone Encounter (Signed)
Patient called to talk with Dr. Hilty or nurse. Please call back 

## 2022-03-13 NOTE — Telephone Encounter (Signed)
Thanks ..not sure if he needs a new PA - but will have [place] look into this when she returns or Geralyn Flash could take a look at it since she knows how to do these.  Thanks.  -[place]

## 2022-03-14 MED ORDER — ZOLPIDEM TARTRATE 5 MG PO TABS: 5.0000 mg | ORAL_TABLET | ORAL | 4 refills | Status: DC

## 2022-03-20 ENCOUNTER — Other Ambulatory Visit: Payer: Self-pay | Admitting: Internal Medicine

## 2022-03-20 ENCOUNTER — Other Ambulatory Visit (HOSPITAL_COMMUNITY): Payer: Self-pay

## 2022-03-20 ENCOUNTER — Telehealth: Payer: 59 | Admitting: Physician Assistant

## 2022-03-20 ENCOUNTER — Encounter: Payer: 59 | Admitting: Physician Assistant

## 2022-03-20 DIAGNOSIS — B001 Herpesviral vesicular dermatitis: Secondary | ICD-10-CM

## 2022-03-20 MED ORDER — VALACYCLOVIR HCL 1 G PO TABS
2000.0000 mg | ORAL_TABLET | Freq: Two times a day (BID) | ORAL | 3 refills | Status: DC
  Filled 2022-03-20: qty 8, 2d supply, fill #0

## 2022-03-20 MED ORDER — VALACYCLOVIR HCL 1 G PO TABS
2000.0000 mg | ORAL_TABLET | Freq: Two times a day (BID) | ORAL | 0 refills | Status: DC
  Filled 2022-03-20: qty 8, 2d supply, fill #0

## 2022-03-20 NOTE — Progress Notes (Signed)
Patient completed VV instead 

## 2022-03-20 NOTE — Progress Notes (Signed)
Virtual Visit Consent   Michael Franklin, you are scheduled for a virtual visit with a [facility] provider today. Just as with appointments in the office, your consent must be obtained to participate. Your consent will be active for this visit and any virtual visit you may have with one of our providers in the next 365 days. If you have a MyChart account, a copy of this consent can be sent to you electronically.  As this is a virtual visit, video technology does not allow for your provider to perform a traditional examination. This may limit your provider's ability to fully assess your condition. If your provider identifies any concerns that need to be evaluated in person or the need to arrange testing (such as labs, EKG, etc.), we will make arrangements to do so. Although advances in technology are sophisticated, we cannot ensure that it will always work on either your end or our end. If the connection with a video visit is poor, the visit may have to be switched to a telephone visit. With either a video or telephone visit, we are not always able to ensure that we have a secure connection.  By engaging in this virtual visit, you consent to the provision of healthcare and authorize for your insurance to be billed (if applicable) for the services provided during this visit. Depending on your insurance coverage, you may receive a charge related to this service.  I need to obtain your verbal consent now. Are you willing to proceed with your visit today? Michael Franklin has provided verbal consent on 03/20/2022 for a virtual visit (video or telephone). Margaretann Loveless, PA-C  Date: 03/20/2022 12:53 PM  Virtual Visit via Video Note   IMargaretann Loveless, connected with  Michael Franklin  ([id number], [id number]) on 03/20/22 at  1:00 PM EDT by a video-enabled telemedicine application and verified that I am speaking with the correct person using two identifiers.  Location: Patient: Virtual Visit  Location Patient: Other: work; isolated Provider: Engineer, mining Provider: Home Office   I discussed the limitations of evaluation and management by telemedicine and the availability of in person appointments. The patient expressed understanding and agreed to proceed.    History of Present Illness: Michael Franklin is a 46 y.o. who identifies as a male who was assigned male at birth, and is being seen today for cold sore.  HPI: Mouth Lesions  The onset was sudden. The problem occurs continuously. The problem has been gradually worsening. The problem is mild. Nothing relieves the symptoms. Nothing aggravates the symptoms. Associated symptoms include mouth sores. Pertinent negatives include no fever, no sore throat and no URI.   Gets cold sores intermittently.   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  Swallow whole., Disp: 90 tablet, Rfl: 3   Blood Pressure Monitoring (OMRON 3 SERIES BP MONITOR) DEVI, Use as directed, Disp: 1 each, Rfl: 0   methylPREDNISolone (MEDROL) 4 MG TBPK tablet, Use as directed within the package, Disp: 21 tablet, Rfl: 1   nitroGLYCERIN (NITROSTAT) 0.4 MG SL tablet, Place 1 tablet under the tongue every 5 minutes as needed for chest pain., Disp: 25 tablet, Rfl: 6   rosuvastatin (CRESTOR) 5 MG  tablet, TAKE 1 TABLET BY MOUTH ONCE A DAY, Disp: 90 tablet, Rfl: 3   valACYclovir (VALTREX) 1000 MG tablet, Take 2 tablets (2,000 mg total) by mouth 2 (two) times daily., Disp: 8 tablet, Rfl: 3   zolpidem (AMBIEN) 5 MG tablet, Take 1 tablet by mouth at bedtime as needed for insomnia., Disp: 30 tablet, Rfl: 4  Observations/Objective: Patient is well-developed, well-nourished in no acute distress.  Resting comfortably  Head is normocephalic, atraumatic.  No labored breathing.  Speech is clear and coherent with logical content.  Patient is alert and oriented at baseline.  Lesion noted on left upper lip with topical ointment applied   Assessment and Plan: 1. Recurrent cold sores - valACYclovir (VALTREX) 1000 MG tablet; Take 2 tablets (2,000 mg total) by mouth 2 (two) times daily.  Dispense: 8 tablet; Refill: 3  - Valtrex prescribed - May apply topical treatment as needed - Seek in person evaluation if symptoms continue or worsen  Follow Up Instructions: I discussed the assessment and treatment plan with the patient. The patient was provided an opportunity to ask questions and all were answered. The patient agreed with the plan and demonstrated an understanding of the instructions.  A copy of instructions were sent to the patient via MyChart unless otherwise noted below.    The patient was advised to call back or seek an in-person evaluation if the symptoms worsen or if the condition fails to improve as anticipated.  Time:  I spent 8 minutes with the patient via telehealth technology discussing the above problems/concerns.    Margaretann Loveless, PA-C

## 2022-03-20 NOTE — Patient Instructions (Signed)
Michael Franklin, thank you for joining Michael Loveless, PA-C for today's virtual visit.  While this provider is not your primary care provider (PCP), if your PCP is located in our provider database this encounter information will be shared with them immediately following your visit.  Consent: (Patient) Michael Franklin provided verbal consent for this virtual visit at the beginning of the encounter.  Current Medications:  Current Outpatient Medications:    amLODipine (NORVASC) 5 MG tablet, TAKE 1 TABLET BY MOUTH ONCE A DAY, Disp: 90 tablet, Rfl: 3   aspirin EC 81 MG tablet, Take 1 tablet (81 mg total) by mouth daily. Swallow whole., Disp: 90 tablet, Rfl: 3   Blood Pressure Monitoring (OMRON 3 SERIES BP MONITOR) DEVI, Use as directed, Disp: 1 each, Rfl: 0   methylPREDNISolone (MEDROL) 4 MG TBPK tablet, Use as directed within the package, Disp: 21 tablet, Rfl: 1   nitroGLYCERIN (NITROSTAT) 0.4 MG SL tablet, Place 1 tablet under the tongue every 5 minutes as needed for chest pain., Disp: 25 tablet, Rfl: 6   rosuvastatin (CRESTOR) 5 MG tablet, TAKE 1 TABLET BY MOUTH ONCE A DAY, Disp: 90 tablet, Rfl: 3   valACYclovir (VALTREX) 1000 MG tablet, Take 2 tablets (2,000 mg total) by mouth 2 (two) times daily., Disp: 8 tablet, Rfl: 3   zolpidem (AMBIEN) 5 MG tablet, Take 1 tablet by mouth at bedtime as needed for insomnia., Disp: 30 tablet, Rfl: 4   Medications ordered in this encounter:  Meds ordered this encounter  Medications   DISCONTD: valACYclovir (VALTREX) 1000 MG tablet    Sig: Take 2 tablets (2,000 mg total) by mouth 2 (two) times daily.    Dispense:  8 tablet    Refill:  0    Order Specific Question:   Supervising Provider    Answer:   Hyacinth Meeker, BRIAN [[id number]]   valACYclovir (VALTREX) 1000 MG tablet    Sig: Take 2 tablets (2,000 mg total) by mouth 2 (two) times daily.    Dispense:  8 tablet    Refill:  3    Order Specific Question:   Supervising Provider    Answer:   Hyacinth Meeker, BRIAN  [[id number]]     *If you need refills on other medications prior to your next appointment, please contact your pharmacy*  Follow-Up: Call back or seek an in-person evaluation if the symptoms worsen or if the condition fails to improve as anticipated.  Other Instructions Cold Sore  A cold sore, also called a fever blister, is a small, fluid-filled sore that forms inside the mouth or on the lips, gums, nose, chin, or cheeks. Cold sores can spread to other parts of the body, such as the eyes, fingers, or genitals. Cold sores can spread from person to person (are contagious) until the sores crust over completely. Most cold sores go away within 2 weeks. What are the causes? Cold sores are caused by an infection from a common type of herpes simplex virus (HSV-1). HSV-1 is closely related to the HSV-2virus, which is the virus that causes genital herpes, but these viruses are not the same. Once a person is infected with HSV-1, the virus remains permanently in the body. HSV-1 is spread from person to person through close contact, such as through kissing, touching the affected area, or sharing personal items such as lip balm, razors, a drinking glass, or eating utensils. What increases the risk? You are more likely to develop this condition if you: Are tired, stressed, or sick. Are menstruating. Are  pregnant. Take certain medicines. Are exposed to cold weather or too much sun. What are the signs or symptoms? Symptoms of a cold sore outbreak go through different stages. These are the stages of a cold sore: Tingling, itching, or burning is felt 1-2 days before the outbreak. Fluid-filled blisters appear on the lips, inside the mouth, on the nose, or on the cheeks. The blisters start to ooze clear fluid. The blisters dry up, and a yellow crust appears in their place. The crust falls off. In some cases, other symptoms can develop during a cold sore outbreak. These can include: Fever. Sore  throat. Headache. Muscle aches. Swollen neck glands. How is this diagnosed? This condition is diagnosed based on your medical history and a physical exam. Your health care provider may do a blood test or may swab some fluid from your sore and then examine the swab in the lab. How is this treated? There is no cure for cold sores or HSV-1. There is also no vaccine for HSV-1. Most cold sores go away on their own without treatment within 2 weeks. Medicines cannot make the infection go away, but your health care provider may prescribe medicines to: Help relieve some of the pain associated with the sores. Work to stop the virus from multiplying. Shorten healing time. Medicines may be in the form of creams, gels, pills, or a shot. Follow these instructions at home: Medicines Take or apply over-the-counter and prescription medicines only as told by your health care provider. Use a cotton-tip swab to apply creams or gels to your sores. Ask your health care provider if you can take lysine supplements. Research has found that lysine may help heal the cold sore faster and prevent outbreaks. Sore care  Do not touch the sores or pick the scabs. Wash your hands often with soap and water for at least 20 seconds. Do not touch your eyes without washing your hands first. Keep the sores clean and dry. If directed, put ice on the sores. To do this: Put ice in a plastic bag. Place a towel between your skin and the bag. Leave the ice on for 20 minutes, 2-3 times a day. Remove the ice if your skin turns bright red. This is very important. If you cannot feel pain, heat, or cold, you have a greater risk of damage to the area. Eating and drinking Eat a soft, bland diet. Avoid eating hot, cold, or salty foods. Use a straw if it hurts to drink out of a glass. Eat foods that are rich in lysine, such as meat, fish, and dairy products. Avoid sugary foods, chocolates, nuts, and grains. These foods are rich in a  nutrient called arginine, which can cause the virus to multiply. Lifestyle Do not kiss, have oral sex, or share personal items until your sores heal. Stress, poor sleep, and being out in the sun can trigger outbreaks. Make sure you: Do activities that help you relax, such as deep breathing exercises or meditation. Get enough sleep. Apply sunscreen on your lips before you go out in the sun. Contact a health care provider if: You have symptoms for more than 2 weeks. You have pus coming from the sores. You have redness that is spreading. You have pain or irritation in your eye. You get sores on your genitals. Your sores do not heal within 2 weeks. You have frequent cold sore outbreaks. Get help right away if: You have a fever and your symptoms suddenly get worse. You have a headache  and confusion. You have tiredness (fatigue) or loss of appetite. You have a stiff neck or sensitivity to light. Summary A cold sore, also called a fever blister, is a small, fluid-filled sore that forms inside the mouth or on the lips, gums, nose, chin, or cheeks. Most cold sores go away on their own without treatment within 2 weeks. Your health care provider may prescribe medicines to help relieve some of the pain, work to stop the virus from multiplying, and shorten healing time. Wash your hands often with soap and water for at least 20 seconds. Do not touch your eyes without washing your hands first. Do not kiss, have oral sex, or share personal items until your sores heal. Contact a health care provider if your sores do not heal within 2 weeks. This information is not intended to replace advice given to you by your health care provider. Make sure you discuss any questions you have with your health care provider. Document Revised: 05/25/2021 Document Reviewed: 05/25/2021 Elsevier Patient Education  2023 Elsevier Inc.    If you have been instructed to have an in-person evaluation today at a local Urgent  Care facility, please use the link below. It will take you to a list of all of our available [facility], including address, phone number and hours of operation. Please do not delay care.  [facility]  If you or a family member do not have a primary care provider, use the link below to schedule a visit and establish care. When you choose a [facility] primary care physician or advanced practice provider, you gain a long-term partner in health. Find a Primary Care Provider  Learn more about [facility] in-office and virtual care options: [facility] - Get Care Now

## 2022-03-21 ENCOUNTER — Other Ambulatory Visit (HOSPITAL_COMMUNITY): Payer: Self-pay

## 2022-03-21 MED ORDER — REPATHA SURECLICK 140 MG/ML ~~LOC~~ SOAJ
SUBCUTANEOUS | 1 refills | Status: DC
  Filled 2022-03-21: qty 6, 84d supply, fill #0
  Filled 2022-06-12: qty 6, 84d supply, fill #1

## 2022-03-21 MED ORDER — REPATHA SURECLICK 140 MG/ML ~~LOC~~ SOAJ
1.0000 | SUBCUTANEOUS | 1 refills | Status: DC
  Filled 2022-03-21: qty 6, fill #0

## 2022-03-21 NOTE — Telephone Encounter (Signed)
Prescription sent to [facility] outpatient pharmacy. Notified patient in MyChart

## 2022-03-21 NOTE — Telephone Encounter (Signed)
Rx(s) sent to pharmacy electronically.  

## 2022-03-28 ENCOUNTER — Other Ambulatory Visit (HOSPITAL_COMMUNITY)
Admission: RE | Admit: 2022-03-28 | Discharge: 2022-03-28 | Disposition: A | Discharge status: Home / Self Care | Payer: 59 | Source: Ambulatory Visit | Attending: Student | Admitting: Student

## 2022-03-28 ENCOUNTER — Ambulatory Visit (INDEPENDENT_AMBULATORY_CARE_PROVIDER_SITE_OTHER): Payer: 59 | Admitting: *Deleted

## 2022-03-28 VITALS — BP 124/90 | HR 48 | Ht 70.0 in | Wt 177.0 lb

## 2022-03-28 DIAGNOSIS — R079 Chest pain, unspecified: Secondary | ICD-10-CM | POA: Diagnosis not present

## 2022-03-28 LAB — TROPONIN I (HIGH SENSITIVITY): Troponin I (High Sensitivity): 5 ng/L (ref <18)

## 2022-03-30 ENCOUNTER — Telehealth: Payer: Self-pay | Admitting: Cardiovascular Disease

## 2022-03-30 NOTE — Telephone Encounter (Signed)
Returned the call to the patient. Symptoms have been addressed. Appointment made for 8/29 with Dr. Kirke Corin. Patient is aware of ED precautions.

## 2022-03-30 NOTE — Telephone Encounter (Signed)
  Per MyChart scheduling message:  1. Are you having Chest pain right now? No     2. Are you experiencing any other symptoms (ex. Short of breath, nausea, vomiting, sweating)? No     3. How long have you been experiencing chest pain?  Intermittent 4-5 days   Mostly at rest. no chest pain on exertion     4. Is your chest pain continuous or coming and going? Intermittent.     5. Have you taken Nitroglycerin? Yes.

## 2022-04-25 ENCOUNTER — Ambulatory Visit: Payer: 59 | Attending: Cardiovascular Disease | Admitting: Cardiovascular Disease

## 2022-04-25 ENCOUNTER — Other Ambulatory Visit (HOSPITAL_COMMUNITY): Payer: Self-pay

## 2022-04-25 ENCOUNTER — Encounter: Payer: Self-pay | Admitting: Cardiovascular Disease

## 2022-04-25 VITALS — BP 116/82 | HR 57 | Ht 70.0 in | Wt 175.0 lb

## 2022-04-25 DIAGNOSIS — I25118 Atherosclerotic heart disease of native coronary artery with other forms of angina pectoris: Secondary | ICD-10-CM

## 2022-04-25 DIAGNOSIS — E785 Hyperlipidemia, unspecified: Secondary | ICD-10-CM | POA: Diagnosis not present

## 2022-04-25 DIAGNOSIS — I10 Essential (primary) hypertension: Secondary | ICD-10-CM | POA: Diagnosis not present

## 2022-04-25 MED ORDER — LEVOCETIRIZINE DIHYDROCHLORIDE 5 MG PO TABS
5.0000 mg | ORAL_TABLET | Freq: Every evening | ORAL | 3 refills | Status: AC
  Filled 2022-04-25: qty 90, 90d supply, fill #0
  Filled 2022-07-22 – 2022-08-24 (×4): qty 90, 90d supply, fill #1
  Filled 2022-12-05: qty 90, 90d supply, fill #2
  Filled 2023-04-09: qty 90, 90d supply, fill #3

## 2022-04-25 MED ORDER — ZOLPIDEM TARTRATE 5 MG PO TABS: 5.0000 mg | ORAL_TABLET | ORAL | 4 refills | Status: AC

## 2022-04-25 MED ORDER — ZOLPIDEM TARTRATE 5 MG PO TABS
5.0000 mg | ORAL_TABLET | Freq: Every evening | ORAL | 4 refills | Status: DC | PRN
  Filled 2022-04-25: qty 30, 30d supply, fill #0
  Filled 2022-06-16: qty 30, 30d supply, fill #1
  Filled 2022-06-28 – 2022-10-13 (×2): qty 30, 30d supply, fill #2

## 2022-04-25 MED ORDER — NITROGLYCERIN 0.4 MG SL SUBL
0.4000 mg | SUBLINGUAL_TABLET | SUBLINGUAL | 6 refills | Status: DC | PRN
  Filled 2022-04-25: qty 25, 8d supply, fill #0
  Filled 2022-06-28: qty 25, 8d supply, fill #1

## 2022-04-25 NOTE — Progress Notes (Signed)
Cardiology Office Note   Date:  04/25/2022   ID:  Michael Franklin, DOB 05-03-1976, MRN [id number]  PCP:  Iran Ouch, MD  Cardiologist:   Lorine Bears, MD   Chief Complaint  Patient presents with   Follow-up   Chest Pain       History of Present Illness: Michael Franklin is a 46 y.o. male who presents for a follow-up visit regarding coronary artery disease status post CABG. He is one of our hospitalists.  He has known history of hyperlipidemia and family history of coronary artery disease.  His father had CABG in his 16s.  The patient has been very healthy throughout his life with excellent lifestyle and he plays soccer on a regular basis.  He did not tolerate atorvastatin in the past due to myalgia.  He presented in Oct 2021 with non-ST elevation myocardial infarction. EKG was unremarkable with the exception of possible septal Q waves.Echocardiogram showed an EF of 50 to 55% with mild mitral regurgitation.  Cardiac catheterization surprisingly showed severe three-vessel coronary artery disease with an occluded ostial LAD which was significantly calcified with collaterals noted from the right coronary artery, 95% stenosis in large ostial ramus and severe right PDA stenosis.  LVEDP was 24.  The patient underwent CABG by Dr. Cliffton Asters.  The patient was referred to Dr. Rennis Golden to evaluate for premature atherosclerosis and was found to have significantly elevated LP(a) elevation.  The patient has been tolerating small dose Crestor and was started on Repatha.  He had symptomatic PVCs that initially responded to treatment with Toprol but he developed bradycardia with fatigue and thus, Toprol was discontinued.  He had recurrent chest pain since CABG with some intense episodes partially responded to nitroglycerin.  Repeat cardiac catheterization was done in March of 2022 which showed significant underlying three-vessel coronary artery disease with patent LIMA to LAD, patent radial to ramus and  patent SVG to right PDA.  There was moderate disease in the proximal portion of SVG to right PDA.  However, the right PDA itself was relatively supplying a small territory and the native RCA was not significantly diseased other than the right PDA.  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   He improved with colchicine.  He continues to have intermittent intense episodes of chest pain mostly at rest and not with physical activities.  These episodes usually respond to sublingual nitroglycerin. Frequently, these episodes are triggered by stress.   Past Medical History:  Diagnosis Date   Hyperlipidemia    NSTEMI (non-ST elevated myocardial infarction) [facility])     Past Surgical History:  Procedure Laterality Date   CORONARY ARTERY BYPASS GRAFT N/A 06/15/2020   Procedure: CORONARY ARTERY BYPASS GRAFTING (CABG) TIMES THREE USING HARVESTED LEFT RADIAL ARTERY, LEFT INTERNAL MAMMARY ARTERY,RIGHT GREATER SAPHENOUS VEIN,HARVESTED ENDOSCOPICALLY. Removal of balloon pump.;  Surgeon: Corliss Skains, MD;  [facility];  Service: Open Heart Surgery;  Laterality: N/A;   CORONARY/GRAFT ACUTE MI REVASCULARIZATION N/A 06/14/2020   Procedure: Coronary/Graft Acute MI Revascularization;  Surgeon: Iran Ouch, MD;  Location: [facility] INVASIVE CV LAB;  Service: Cardiovascular;  Laterality: N/A;   IABP INSERTION N/A 06/15/2020   Procedure: IABP Insertion;  Surgeon: Iran Ouch, MD;  Location: [facility] INVASIVE CV LAB;  Service: Cardiovascular;  Laterality: N/A;   LEFT HEART CATH AND CORONARY ANGIOGRAPHY N/A 06/14/2020   Procedure: LEFT HEART CATH AND CORONARY ANGIOGRAPHY;  Surgeon: Iran Ouch, MD;  Location: [facility] INVASIVE  CV LAB;  Service: Cardiovascular;  Laterality: N/A;   LEFT HEART CATH AND CORS/GRAFTS ANGIOGRAPHY N/A 11/05/2020   Procedure: LEFT HEART CATH AND CORS/GRAFTS ANGIOGRAPHY;  Surgeon: Wellington Hampshire, MD;  Location: [facility] CV LAB;  Service:  Cardiovascular;  Laterality: N/A;   TEE WITHOUT CARDIOVERSION N/A 06/15/2020   Procedure: TRANSESOPHAGEAL ECHOCARDIOGRAM (TEE);  Surgeon: Lajuana Matte, MD;  Location: [facility];  Service: Open Heart Surgery;  Laterality: N/A;     Current Outpatient Medications  Medication Sig Dispense Refill   amLODipine (NORVASC) 5 MG tablet TAKE 1 TABLET BY MOUTH ONCE A DAY 90 tablet 3   aspirin EC 81 MG tablet Take 1 tablet (81 mg total) by mouth daily. Swallow whole. 90 tablet 3   Blood Pressure Monitoring (OMRON 3 SERIES BP MONITOR) DEVI Use as directed 1 each 0   Evolocumab (REPATHA SURECLICK) XX123456 MG/ML SOAJ INJECT 1 DOSE INTO THE SKIN EVERY 14 DAYS 6 mL 1   levocetirizine (XYZAL) 5 MG tablet Take 1 tablet (5 mg total) by mouth every evening. 90 tablet 3   rosuvastatin (CRESTOR) 5 MG tablet TAKE 1 TABLET BY MOUTH ONCE A DAY 90 tablet 3   nitroGLYCERIN (NITROSTAT) 0.4 MG SL tablet Place 1 tablet under the tongue every 5 minutes as needed for chest pain. 25 tablet 6   zolpidem (AMBIEN) 5 MG tablet Take 1 tablet by mouth at bedtime as needed for insomnia. 30 tablet 4   zolpidem (AMBIEN) 5 MG tablet Take 1 tablet (5 mg total) by mouth at bedtime as needed. 30 tablet 4   No current facility-administered medications for this visit.    Allergies:   Atorvastatin, Crestor [rosuvastatin], and Penicillins    Social History:  The patient  reports that he has never smoked. He has never used smokeless tobacco. He reports that he does not drink alcohol and does not use drugs.   Family History:  The patient's family history includes Asthma in his mother; CAD in his father; Hyperlipidemia in his father; Hypertension in his father.    ROS:  Please see the history of present illness.   Otherwise, review of systems are positive for none.   All other systems are reviewed and negative.    PHYSICAL EXAM: VS:  BP 116/82 (BP Location: Left Arm, Patient Position: Sitting, Cuff Size: Normal)   Pulse (!) 57   Ht 5'  10" (1.778 m)   Wt 175 lb (79.4 kg)   BMI 25.11 kg/m  , BMI Body mass index is 25.11 kg/m. GEN: Well nourished, well developed, in no acute distress  HEENT: normal  Neck: no JVD, carotid bruits, or masses Cardiac: RRR; no murmurs, rubs, or gallops,no edema  Respiratory:  clear to auscultation bilaterally, normal work of breathing GI: soft, nontender, nondistended, + BS MS: no deformity or atrophy  Skin: warm and dry, no rash Neuro:  Strength and sensation are intact Psych: euthymic mood, full affect   EKG:  EKG is ordered today. The ekg ordered today demonstrates sinus bradycardia with possible left atrial enlargement   Recent Labs: 01/10/2022: ALT 20; BUN 20; Creatinine, Ser 1.08; Hemoglobin 14.1; Platelets 208; Potassium 4.7; Sodium 142; TSH 1.640    Lipid Panel    Component Value Date/Time   CHOL 89 (L) 01/10/2022 1021   TRIG 115 01/10/2022 1021   HDL 47 01/10/2022 1021   CHOLHDL 1.9 01/10/2022 1021   CHOLHDL 4.5 06/14/2020 1729   VLDL 13 06/14/2020 1729   LDLCALC 21 01/10/2022 1021  Wt Readings from Last 3 Encounters:  04/25/22 175 lb (79.4 kg)  03/28/22 177 lb (80.3 kg)  01/10/22 172 lb (78 kg)           No data to display            ASSESSMENT AND PLAN:  1.  Coronary artery disease involving native coronary arteries with other forms of angina: Intermittent episodes of chest pain at rest that responds to nitroglycerin.  Possible coronary spasm.  He is doing well at the present time.  Continue amlodipine.  I refilled sublingual nitroglycerin.  Most of these episodes seem to be triggered by stress and anxiety.    2.  Hyperlipidemia: Continue treatment with small dose rosuvastatin and Repatha.  I requested lipid and liver profile.  Most recent lipid profile showed an LDL of 21.  He has known history of elevated LP(a) at 225 that subsequently improved with Repatha treatment.  Most recently, this was down to 164.  3.  Essential hypertension: Continue  treatment with amlodipine.  4.  PVCs: Resolved.  He is off Toprol.  5.  Anxiety: The patient has significant stress from workload as well as stress at home.    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  Frequent episodes of chest pain at rest seems to correlate with stress.   Disposition:   FU with me in 6 months  Signed,  Lorine Bears, MD  04/25/2022 10:41 AM     [facility]

## 2022-04-25 NOTE — Patient Instructions (Signed)
Medication Instructions:  The current medical regimen is effective;  continue present plan and medications.  *If you need a refill on your cardiac medications before your next appointment, please call your pharmacy*   Follow-Up: At [facility], you and your health needs are our priority.  As part of our continuing mission to provide you with exceptional heart care, we have created designated Provider Care Teams.  These Care Teams include your primary Cardiologist (physician) and Advanced Practice Providers (APPs -  Physician Assistants and Nurse Practitioners) who all work together to provide you with the care you need, when you need it.  We recommend signing up for the patient portal called "MyChart".  Sign up information is provided on this After Visit Summary.  MyChart is used to connect with patients for Virtual Visits (Telemedicine).  Patients are able to view lab/test results, encounter notes, upcoming appointments, etc.  Non-urgent messages can be sent to your provider as well.   To learn more about what you can do with MyChart, go to [email or web address].    Your next appointment:   6 month(s)  The format for your next appointment:   In Person  Provider:   Muhammad Arida, MD         

## 2022-04-26 ENCOUNTER — Encounter: Payer: Self-pay | Admitting: Urology

## 2022-04-26 ENCOUNTER — Other Ambulatory Visit (HOSPITAL_COMMUNITY): Payer: Self-pay

## 2022-04-26 ENCOUNTER — Ambulatory Visit (INDEPENDENT_AMBULATORY_CARE_PROVIDER_SITE_OTHER): Payer: 59 | Admitting: Urology

## 2022-04-26 VITALS — BP 139/85 | HR 54

## 2022-04-26 DIAGNOSIS — N529 Male erectile dysfunction, unspecified: Secondary | ICD-10-CM | POA: Diagnosis not present

## 2022-04-26 DIAGNOSIS — N401 Enlarged prostate with lower urinary tract symptoms: Secondary | ICD-10-CM | POA: Diagnosis not present

## 2022-04-26 DIAGNOSIS — R339 Retention of urine, unspecified: Secondary | ICD-10-CM | POA: Diagnosis not present

## 2022-04-26 DIAGNOSIS — N400 Enlarged prostate without lower urinary tract symptoms: Secondary | ICD-10-CM | POA: Diagnosis not present

## 2022-04-26 LAB — URINALYSIS, ROUTINE W REFLEX MICROSCOPIC
Bilirubin, UA: NEGATIVE
Glucose, UA: NEGATIVE
Ketones, UA: NEGATIVE
Leukocytes,UA: NEGATIVE
Nitrite, UA: NEGATIVE
Protein,UA: NEGATIVE
RBC, UA: NEGATIVE
Specific Gravity, UA: 1.01 (ref 1.005–1.030)
Urobilinogen, Ur: 0.2 mg/dL (ref 0.2–1.0)
pH, UA: 5.5 (ref 5.0–7.5)

## 2022-04-26 LAB — BLADDER SCAN AMB NON-IMAGING: Scan Result: 176

## 2022-04-26 MED ORDER — TADALAFIL 5 MG PO TABS
5.0000 mg | ORAL_TABLET | Freq: Every day | ORAL | 11 refills | Status: DC
  Filled 2022-04-26: qty 30, 30d supply, fill #0
  Filled 2022-05-21: qty 30, 30d supply, fill #1
  Filled 2022-06-27: qty 30, 30d supply, fill #2
  Filled 2022-07-22 – 2022-08-24 (×4): qty 30, 30d supply, fill #3
  Filled 2022-09-28: qty 30, 30d supply, fill #4
  Filled 2023-01-12 – 2023-04-09 (×2): qty 30, 30d supply, fill #5

## 2022-04-26 MED ORDER — TADALAFIL 5 MG PO TABS
5.0000 mg | ORAL_TABLET | Freq: Every day | ORAL | 11 refills | Status: DC
  Filled 2022-04-26: qty 30, 30d supply, fill #0

## 2022-04-26 NOTE — Progress Notes (Signed)
04/26/2022 11:26 AM   Michael Franklin February 22, 1976 [id number]  Referring provider: Iran Ouch, MD 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 For the past year he has noted increased nocturia 2-3x. IPSS 9 QOL 1 on no BPH therapy. Urine stream is intermittently weak. He has post void dribbling. He has issues getting and maintaining an erection for the past year. Decreased desire. His Father has a hx of prostate cancer diagnosed at age 24. PSA 0.5.  He has a hx of CAD and had CABG in 2021     PMH: Past Medical History:  Diagnosis Date   Hyperlipidemia    NSTEMI (non-ST elevated myocardial infarction) [facility])     Surgical History: Past Surgical History:  Procedure Laterality Date   CORONARY ARTERY BYPASS GRAFT N/A 06/15/2020   Procedure: CORONARY ARTERY BYPASS GRAFTING (CABG) TIMES THREE USING HARVESTED LEFT RADIAL ARTERY, LEFT INTERNAL MAMMARY ARTERY,RIGHT GREATER SAPHENOUS VEIN,HARVESTED ENDOSCOPICALLY. Removal of balloon pump.;  Surgeon: Corliss Skains, MD;  Location: [facility] OR;  Service: Open Heart Surgery;  Laterality: N/A;   CORONARY/GRAFT ACUTE MI REVASCULARIZATION N/A 06/14/2020   Procedure: Coronary/Graft Acute MI Revascularization;  Surgeon: Iran Ouch, MD;  Location: [facility] INVASIVE CV LAB;  Service: Cardiovascular;  Laterality: N/A;   IABP INSERTION N/A 06/15/2020   Procedure: IABP Insertion;  Surgeon: Iran Ouch, MD;  Location: [facility] INVAS[facility] CV LAB;  Service: Cardiovascular;  Laterality: N/A;   LEFT HEART CATH AND CORONARY ANGIOGRAPHY N/A 06/14/2020   Procedure: LEFT HEART CATH AND CORONARY ANGIOGRAPHY;  Surgeon: Iran Ouch, MD;  Location: [facility] INVAS[facility] CV LAB;  Service: Cardiovascular;  Laterality: N/A;   LEFT HEART CATH AND CORS/GRAFTS ANGIOGRAPHY N/A 11/05/2020   Procedure: LEFT HEART CATH AND CORS/GRAFTS ANGIOGRAPHY;  Surgeon: Iran Ouch, MD;  [facility];  Service: Cardiovascular;  Laterality: N/A;   TEE WITHOUT CARDIOVERSION N/A 06/15/2020   Procedure: TRANSESOPHAGEAL ECHOCARDIOGRAM (TEE);  Surgeon: Corliss Skains, MD;  Location: [facility];  Service: Open Heart Surgery;  Laterality: N/A;    Home Medications:  Allergies as of 04/26/2022       Reactions   Atorvastatin    myalgias   Crestor [rosuvastatin]    higher doses cause myalgias   Penicillins         Medication List        Accurate as of April 26, 2022 11:26 AM. If you have any questions, ask your nurse or doctor.          amLODipine 5 MG tablet Commonly known as: NORVASC TAKE 1 TABLET BY MOUTH ONCE A DAY   aspirin EC 81 MG tablet Take 1 tablet (81 mg total) by mouth daily. Swallow whole.   levocetirizine 5 MG tablet Commonly known as: XYZAL Take 1 tablet (5 mg total) by mouth every evening.   nitroGLYCERIN 0.4 MG SL tablet Commonly known as: NITROSTAT Place 1 tablet under the tongue every 5 minutes as needed for chest pain.   Omron 3 Series BP Monitor Devi Use as directed   Repatha SureClick 140 MG/ML Soaj Generic drug: Evolocumab INJECT 1 DOSE INTO THE SKIN EVERY 14 DAYS   rosuvastatin 5 MG tablet Commonly known as: CRESTOR TAKE 1 TABLET BY MOUTH ONCE A DAY   zolpidem 5 MG tablet Commonly known as: AMBIEN Take 1 tablet by mouth at bedtime as needed for insomnia.  zolpidem 5 MG tablet Commonly known as: AMBIEN Take 1 tablet by mouth at bedtime as needed for insomnia. (Take 1 tablet (5 mg total) by mouth at bedtime as needed.)        Allergies:  Allergies  Allergen Reactions   Atorvastatin     myalgias   Crestor [Rosuvastatin]     higher doses cause myalgias   Penicillins     Family History: Family History  Problem Relation Age of Onset   Asthma Mother    Hypertension Father    CAD Father    Hyperlipidemia Father     Social History:  reports that he has never smoked. He has  never used smokeless tobacco. He reports that he does not drink alcohol and does not use drugs.  ROS: All other review of systems were reviewed and are negative except what is noted above in HPI  Physical Exam: BP 139/85   Pulse (!) 54   Constitutional:  Alert and oriented, No acute distress. HEENT: [place] AT, moist mucus membranes.  Trachea midline, no masses. Cardiovascular: No cyanosis, or edema. Respiratory: Normal respiratory effort, no increased work of breathing Lymph: No cervical or inguinal lymphadenopathy. Skin: No rashes, bruises or suspicious lesions. Neurologic: Grossly intact, no focal deficits, moving all 4 extremities. Psychiatric: Normal mood and affect.  Laboratory Data: Lab Results  Component Value Date   WBC 4.8 01/10/2022   HGB 14.1 01/10/2022   HCT 42.0 01/10/2022   MCV 85 01/10/2022   PLT 208 01/10/2022    Lab Results  Component Value Date   CREATININE 1.08 01/10/2022    No results found for: "PSA"  No results found for: "TESTOSTERONE"  Lab Results  Component Value Date   HGBA1C 5.7 (H) 06/15/2020    Urinalysis No results found for: "COLORURINE", "APPEARANCEUR", "LABSPEC", "PHURINE", "GLUCOSEU", "HGBUR", "BILIRUBINUR", "KETONESUR", "PROTEINUR", "UROBILINOGEN", "NITRITE", "LEUKOCYTESUR"  No results found for: "LABMICR", "WBCUA", "RBCUA", "LABEPIT", "MUCUS", "BACTERIA"  Pertinent Imaging:  No results found for this or any previous visit.  No results found for this or any previous visit.  No results found for this or any previous visit.  No results found for this or any previous visit.  No results found for this or any previous visit.  No results found for this or any previous visit.  No results found for this or any previous visit.  No results found for this or any previous visit.   Assessment & Plan:    1. Erectile dysfunction, unspecified erectile dysfunction type -We will trial tadalafil 5mg  daily  2. Benign prostatic  hyperplasia, unspecified whether lower urinary tract symptoms present -We will trial tadalafil 5mg  daily - Urinalysis, Routine w reflex microscopic - BLADDER SCAN AMB NON-IMAGING  3. Incomplete emptying of bladder -We will trial tadalafil 5mg  daily   No follow-ups on file.  , MD  [facility] [place]

## 2022-04-26 NOTE — Patient Instructions (Signed)

## 2022-05-04 ENCOUNTER — Other Ambulatory Visit: Payer: Self-pay | Admitting: Internal Medicine

## 2022-05-05 ENCOUNTER — Other Ambulatory Visit (HOSPITAL_COMMUNITY): Payer: Self-pay

## 2022-05-05 MED ORDER — AMLODIPINE BESYLATE 5 MG PO TABS
5.0000 mg | ORAL_TABLET | Freq: Every day | ORAL | 3 refills | Status: DC
  Filled 2022-05-05: qty 90, 90d supply, fill #0
  Filled 2022-06-27 – 2022-08-24 (×4): qty 90, 90d supply, fill #1
  Filled 2023-01-12: qty 90, 90d supply, fill #2

## 2022-05-11 ENCOUNTER — Other Ambulatory Visit (HOSPITAL_COMMUNITY): Payer: Self-pay

## 2022-05-22 ENCOUNTER — Other Ambulatory Visit (HOSPITAL_COMMUNITY): Payer: Self-pay

## 2022-05-23 ENCOUNTER — Other Ambulatory Visit (HOSPITAL_COMMUNITY): Payer: Self-pay

## 2022-05-24 ENCOUNTER — Other Ambulatory Visit (HOSPITAL_COMMUNITY): Payer: Self-pay

## 2022-06-12 ENCOUNTER — Other Ambulatory Visit (HOSPITAL_COMMUNITY): Payer: Self-pay

## 2022-06-13 ENCOUNTER — Other Ambulatory Visit (HOSPITAL_COMMUNITY): Payer: Self-pay

## 2022-06-16 ENCOUNTER — Other Ambulatory Visit (HOSPITAL_COMMUNITY): Payer: Self-pay

## 2022-06-16 ENCOUNTER — Encounter: Payer: Self-pay | Admitting: Urology

## 2022-06-16 ENCOUNTER — Ambulatory Visit (INDEPENDENT_AMBULATORY_CARE_PROVIDER_SITE_OTHER): Payer: 59 | Admitting: Urology

## 2022-06-16 VITALS — BP 125/75 | HR 52

## 2022-06-16 DIAGNOSIS — E7841 Elevated Lipoprotein(a): Secondary | ICD-10-CM | POA: Diagnosis not present

## 2022-06-16 DIAGNOSIS — N401 Enlarged prostate with lower urinary tract symptoms: Secondary | ICD-10-CM

## 2022-06-16 DIAGNOSIS — N400 Enlarged prostate without lower urinary tract symptoms: Secondary | ICD-10-CM

## 2022-06-16 DIAGNOSIS — N529 Male erectile dysfunction, unspecified: Secondary | ICD-10-CM | POA: Diagnosis not present

## 2022-06-16 DIAGNOSIS — E785 Hyperlipidemia, unspecified: Secondary | ICD-10-CM | POA: Diagnosis not present

## 2022-06-16 DIAGNOSIS — R339 Retention of urine, unspecified: Secondary | ICD-10-CM

## 2022-06-16 LAB — URINALYSIS, ROUTINE W REFLEX MICROSCOPIC
Bilirubin, UA: NEGATIVE
Glucose, UA: NEGATIVE
Ketones, UA: NEGATIVE
Leukocytes,UA: NEGATIVE
Nitrite, UA: NEGATIVE
Protein,UA: NEGATIVE
Specific Gravity, UA: 1.025 (ref 1.005–1.030)
Urobilinogen, Ur: 0.2 mg/dL (ref 0.2–1.0)
pH, UA: 5.5 (ref 5.0–7.5)

## 2022-06-16 LAB — MICROSCOPIC EXAMINATION
Bacteria, UA: NONE SEEN
RBC, Urine: NONE SEEN /hpf (ref 0–2)
WBC, UA: NONE SEEN /hpf (ref 0–5)

## 2022-06-16 MED ORDER — SILDENAFIL CITRATE 100 MG PO TABS
100.0000 mg | ORAL_TABLET | Freq: Every day | ORAL | 3 refills | Status: DC | PRN
  Filled 2022-06-16: qty 90, 90d supply, fill #0
  Filled 2022-10-13: qty 90, 90d supply, fill #1
  Filled 2023-01-12: qty 90, 90d supply, fill #2

## 2022-06-16 NOTE — Patient Instructions (Signed)

## 2022-06-16 NOTE — Progress Notes (Signed)
06/16/2022 9:30 AM   Michael Franklin Aug 11, 1976 [id number]  Referring provider: Wellington Hampshire, MD 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 Patient is emptying better with less nocturia. IPSS 7 QOL 1.  No change is ability to get and erection with tadalafil 5mg  daily.    PMH: Past Medical History:  Diagnosis Date   Hyperlipidemia    NSTEMI (non-ST elevated myocardial infarction) [facility])     Surgical History: Past Surgical History:  Procedure Laterality Date   CORONARY ARTERY BYPASS GRAFT N/A 06/15/2020   Procedure: CORONARY ARTERY BYPASS GRAFTING (CABG) TIMES THREE USING HARVESTED LEFT RADIAL ARTERY, LEFT INTERNAL MAMMARY ARTERY,RIGHT GREATER SAPHENOUS VEIN,HARVESTED ENDOSCOPICALLY. Removal of balloon pump.;  Surgeon: Lajuana Matte, MD;  Location: [facility];  Service: Open Heart Surgery;  Laterality: N/A;   CORONARY/GRAFT ACUTE MI REVASCULARIZATION N/A 06/14/2020   Procedure: Coronary/Graft Acute MI Revascularization;  Surgeon: Wellington Hampshire, MD;  Location: [place] CV LAB;  Service: Cardiovascular;  Laterality: N/A;   IABP INSERTION N/A 06/15/2020   Procedure: IABP Insertion;  Surgeon: Wellington Hampshire, MD;  Location: [place] CV LAB;  Service: Cardiovascular;  Laterality: N/A;   LEFT HEART CATH AND CORONARY ANGIOGRAPHY N/A 06/14/2020   Procedure: LEFT HEART CATH AND CORONARY ANGIOGRAPHY;  Surgeon: Wellington Hampshire, MD;  Location: [place] CV LAB;  Service: Cardiovascular;  Laterality: N/A;   LEFT HEART CATH AND CORS/GRAFTS ANGIOGRAPHY N/A 11/05/2020   Procedure: LEFT HEART CATH AND CORS/GRAFTS ANGIOGRAPHY;  Surgeon: Wellington Hampshire, MD;  Location: [place] CV LAB;  Service: Cardiovascular;  Laterality: N/A;   TEE WITHOUT CARDIOVERSION N/A 06/15/2020   Procedure: TRANSESOPHAGEAL ECHOCARDIOGRAM (TEE);  Surgeon: Lajuana Matte,  MD;  Location: [facility];  Service: Open Heart Surgery;  Laterality: N/A;    Home Medications:  Allergies as of 06/16/2022       Reactions   Atorvastatin    myalgias   Crestor [rosuvastatin]    higher doses cause myalgias   Penicillins         Medication List        Accurate as of June 16, 2022  9:30 AM. If you have any questions, ask your nurse or doctor.          amLODipine 5 MG tablet Commonly known as: NORVASC Take 1 tablet (5 mg total) by mouth daily.   aspirin EC 81 MG tablet Take 1 tablet (81 mg total) by mouth daily. Swallow whole.   levocetirizine 5 MG tablet Commonly known as: XYZAL Take 1 tablet (5 mg total) by mouth every evening.   nitroGLYCERIN 0.4 MG SL tablet Commonly known as: NITROSTAT Place 1 tablet under the tongue every 5 minutes as needed for chest pain.   Omron 3 Series BP Monitor Devi Use as directed   Repatha SureClick XX123456 MG/ML Soaj Generic drug: Evolocumab INJECT 1 DOSE INTO THE SKIN EVERY 14 DAYS   rosuvastatin 5 MG tablet Commonly known as: CRESTOR TAKE 1 TABLET BY MOUTH ONCE A DAY   tadalafil 5 MG tablet Commonly known as: CIALIS Take 1 tablet (5 mg total) by mouth daily.   zolpidem 5 MG tablet Commonly known as: AMBIEN Take 1 tablet by mouth at bedtime as needed for insomnia.   zolpidem 5 MG tablet Commonly known as: AMBIEN Take 1 tablet by mouth at bedtime as needed for  insomnia. (Take 1 tablet (5 mg total) by mouth at bedtime as needed.)        Allergies:  Allergies  Allergen Reactions   Atorvastatin     myalgias   Crestor [Rosuvastatin]     higher doses cause myalgias   Penicillins     Family History: Family History  Problem Relation Age of Onset   Asthma Mother    Hypertension Father    CAD Father    Hyperlipidemia Father     Social History:  reports that he has never smoked. He has never used smokeless tobacco. He reports that he does not drink alcohol and does not use drugs.  ROS: All other  review of systems were reviewed and are negative except what is noted above in HPI  Physical Exam: BP 125/75   Pulse (!) 52   Constitutional:  Alert and oriented, No acute distress. HEENT: [place] AT, moist mucus membranes.  Trachea midline, no masses. Cardiovascular: No clubbing, cyanosis, or edema. Respiratory: Normal respiratory effort, no increased work of breathing. GI: Abdomen is soft, nontender, nondistended, no abdominal masses GU: No CVA tenderness.  Lymph: No cervical or inguinal lymphadenopathy. Skin: No rashes, bruises or suspicious lesions. Neurologic: Grossly intact, no focal deficits, moving all 4 extremities. Psychiatric: Normal mood and affect.  Laboratory Data: Lab Results  Component Value Date   WBC 4.8 01/10/2022   HGB 14.1 01/10/2022   HCT 42.0 01/10/2022   MCV 85 01/10/2022   PLT 208 01/10/2022    Lab Results  Component Value Date   CREATININE 1.08 01/10/2022    No results found for: "PSA"  No results found for: "TESTOSTERONE"  Lab Results  Component Value Date   HGBA1C 5.7 (H) 06/15/2020    Urinalysis    Component Value Date/Time   APPEARANCEUR Clear 04/26/2022 1126   GLUCOSEU Negative 04/26/2022 1126   BILIRUBINUR Negative 04/26/2022 1126   PROTEINUR Negative 04/26/2022 1126   NITRITE Negative 04/26/2022 1126   LEUKOCYTESUR Negative 04/26/2022 1126    Lab Results  Component Value Date   LABMICR Comment 04/26/2022    Pertinent Imaging:  No results found for this or any previous visit.  No results found for this or any previous visit.  No results found for this or any previous visit.  No results found for this or any previous visit.  No results found for this or any previous visit.  No valid procedures specified. No results found for this or any previous visit.  No results found for this or any previous visit.   Assessment & Plan:    1. Benign prostatic hyperplasia, unspecified whether lower urinary tract symptoms  present -Continue tadalafil 5mg  daily - Urinalysis, Routine w reflex microscopic  2. Incomplete emptying of bladder -Continue tadalafil 5mg  daily - BLADDER SCAN AMB NON-IMAGING  3. Erectile dysfunction -sildenafil 100mg  prn   No follow-ups on file.  Nicolette Bang, MD  [facility] [place]

## 2022-06-16 NOTE — Progress Notes (Signed)
post void residual=52 

## 2022-06-20 ENCOUNTER — Ambulatory Visit: Payer: 59 | Admitting: Urology

## 2022-06-20 ENCOUNTER — Ambulatory Visit: Payer: 59 | Attending: Cardiology

## 2022-06-20 ENCOUNTER — Telehealth: Payer: Self-pay | Admitting: Internal Medicine

## 2022-06-20 NOTE — Telephone Encounter (Signed)
Genetic test for dyslipidemia/ascvd panel ordered ([facility]) Cheek swab completed in office Specimen and necessary paperwork mailed. ID: [id number]

## 2022-06-27 ENCOUNTER — Other Ambulatory Visit: Payer: Self-pay | Admitting: Internal Medicine

## 2022-06-27 ENCOUNTER — Other Ambulatory Visit (HOSPITAL_COMMUNITY): Payer: Self-pay

## 2022-06-27 DIAGNOSIS — I10 Essential (primary) hypertension: Secondary | ICD-10-CM

## 2022-06-27 DIAGNOSIS — E785 Hyperlipidemia, unspecified: Secondary | ICD-10-CM

## 2022-06-27 DIAGNOSIS — Z79899 Other long term (current) drug therapy: Secondary | ICD-10-CM

## 2022-06-28 ENCOUNTER — Other Ambulatory Visit (HOSPITAL_COMMUNITY): Payer: Self-pay

## 2022-06-30 DIAGNOSIS — Z79899 Other long term (current) drug therapy: Secondary | ICD-10-CM | POA: Diagnosis not present

## 2022-06-30 DIAGNOSIS — E785 Hyperlipidemia, unspecified: Secondary | ICD-10-CM | POA: Diagnosis not present

## 2022-06-30 DIAGNOSIS — I10 Essential (primary) hypertension: Secondary | ICD-10-CM | POA: Diagnosis not present

## 2022-07-01 LAB — CBC
Hematocrit: 45.1 % (ref 37.5–51.0)
Hemoglobin: 15.1 g/dL (ref 13.0–17.7)
MCH: 28.9 pg (ref 26.6–33.0)
MCHC: 33.5 g/dL (ref 31.5–35.7)
MCV: 86 fL (ref 79–97)
Platelets: 254 10*3/uL (ref 150–450)
RBC: 5.23 x10E6/uL (ref 4.14–5.80)
RDW: 12.8 % (ref 11.6–15.4)
WBC: 4.5 10*3/uL (ref 3.4–10.8)

## 2022-07-01 LAB — COMPREHENSIVE METABOLIC PANEL
ALT: 17 IU/L (ref 0–44)
AST: 23 IU/L (ref 0–40)
Albumin/Globulin Ratio: 2 (ref 1.2–2.2)
Albumin: 4.5 g/dL (ref 4.1–5.1)
Alkaline Phosphatase: 65 IU/L (ref 44–121)
BUN/Creatinine Ratio: 14 (ref 9–20)
BUN: 15 mg/dL (ref 6–24)
Bilirubin Total: 0.4 mg/dL (ref 0.0–1.2)
CO2: 23 mmol/L (ref 20–29)
Calcium: 8.8 mg/dL (ref 8.7–10.2)
Chloride: 104 mmol/L (ref 96–106)
Creatinine, Ser: 1.08 mg/dL (ref 0.76–1.27)
Globulin, Total: 2.3 g/dL (ref 1.5–4.5)
Glucose: 100 mg/dL — ABNORMAL HIGH (ref 70–99)
Potassium: 4.8 mmol/L (ref 3.5–5.2)
Sodium: 138 mmol/L (ref 134–144)
Total Protein: 6.8 g/dL (ref 6.0–8.5)
eGFR: 85 mL/min/{1.73_m2} (ref >59)

## 2022-07-02 LAB — NMR, LIPOPROFILE
Cholesterol, Total: 102 mg/dL (ref 100–199)
HDL Particle Number: 39.1 umol/L (ref >=30.5)
HDL-C: 56 mg/dL (ref >39)
LDL Particle Number: 357 nmol/L (ref <1000)
LDL Size: 20.5 nm — ABNORMAL LOW (ref >20.5)
LDL-C (NIH Calc): 30 mg/dL (ref 0–99)
LP-IR Score: 44 (ref <=45)
Small LDL Particle Number: 138 nmol/L (ref <=527)
Triglycerides: 76 mg/dL (ref 0–149)

## 2022-07-05 ENCOUNTER — Encounter: Payer: Self-pay | Admitting: Internal Medicine

## 2022-07-22 ENCOUNTER — Other Ambulatory Visit (HOSPITAL_COMMUNITY): Payer: Self-pay

## 2022-07-26 ENCOUNTER — Other Ambulatory Visit (HOSPITAL_COMMUNITY): Payer: Self-pay

## 2022-07-26 ENCOUNTER — Encounter (HOSPITAL_COMMUNITY): Payer: Self-pay

## 2022-08-03 ENCOUNTER — Other Ambulatory Visit (HOSPITAL_COMMUNITY): Payer: Self-pay

## 2022-08-14 ENCOUNTER — Other Ambulatory Visit (HOSPITAL_COMMUNITY): Payer: Self-pay

## 2022-08-14 ENCOUNTER — Other Ambulatory Visit: Payer: Self-pay

## 2022-08-14 ENCOUNTER — Other Ambulatory Visit: Payer: Self-pay | Admitting: Internal Medicine

## 2022-08-14 ENCOUNTER — Telehealth: Payer: 59 | Admitting: Nurse Practitioner

## 2022-08-14 DIAGNOSIS — B001 Herpesviral vesicular dermatitis: Secondary | ICD-10-CM | POA: Diagnosis not present

## 2022-08-14 MED ORDER — ROSUVASTATIN CALCIUM 5 MG PO TABS
5.0000 mg | ORAL_TABLET | Freq: Every day | ORAL | 2 refills | Status: DC
  Filled 2022-08-14 – 2022-08-24 (×3): qty 90, 90d supply, fill #0
  Filled 2023-01-12: qty 90, 90d supply, fill #1
  Filled 2023-05-28: qty 90, 90d supply, fill #2

## 2022-08-14 MED ORDER — ACYCLOVIR 800 MG PO TABS
800.0000 mg | ORAL_TABLET | Freq: Two times a day (BID) | ORAL | 0 refills | Status: AC
  Filled 2022-08-14 – 2022-08-24 (×2): qty 14, 7d supply, fill #0

## 2022-08-14 NOTE — Progress Notes (Signed)
We are sorry that you are not feeling well.  Here is how we plan to help!  Based on what you have shared with me it does look like you have a viral infection.    Most cold sores or fever blisters are small fluid filled blisters around the mouth caused by herpes simplex virus.  The most common strain of the virus causing cold sores is herpes simplex virus 1.  It can be spread by skin contact, sharing eating utensils, or even sharing towels.  Cold sores are contagious to other people until dry. (Approximately 5-7 days).  Wash your hands. You can spread the virus to your eyes through handling your contact lenses after touching the lesions.  Most people experience pain at the sight or tingling sensations in their lips that may begin before the ulcers erupt.  Herpes simplex is treatable but not curable.  It may lie dormant for a long time and then reappear due to stress or prolonged sun exposure.  Many patients have success in treating their cold sores with an over the counter topical called Abreva.  You may apply the cream up to 5 times daily (maximum 10 days) until healing occurs.  If you would like to use an oral antiviral medication to speed the healing of your cold sore, I have sent a prescription to your local pharmacy Acyclovir 800 mg take one by mouth twice a day for 7 days    HOME CARE:  Wash your hands frequently. Do not pick at or rub the sore. Don't open the blisters. Avoid kissing other people during this time. Avoid sharing drinking glasses, eating utensils, or razors. Do not handle contact lenses unless you have thoroughly washed your hands with soap and warm water! Avoid oral sex during this time.  Herpes from sores on your mouth can spread to your partner's genital area. Avoid contact with anyone who has eczema or a weakened immune system. Cold sores are often triggered by exposure to intense sunlight, use a lip balm containing a sunscreen (SPF 30 or higher).  GET HELP RIGHT AWAY  IF:  Blisters look infected. Blisters occur near or in the eye. Symptoms last longer than 10 days. Your symptoms become worse.  MAKE SURE YOU:  Understand these instructions. Will watch your condition. Will get help right away if you are not doing well or get worse.    Your e-visit answers were reviewed by a board certified advanced clinical practitioner to complete your personal care plan.  Depending upon the condition, your plan could have  Included both over the counter or prescription medications.    Please review your pharmacy choice.  Be sure that the pharmacy you have chosen is open so that you can pick up your prescription now.  If there is a problem you can message your provider in MyChart to have the prescription routed to another pharmacy.    Your safety is important to us.  If you have drug allergies check our prescription carefully.  For the next 24 hours you can use MyChart to ask questions about today's visit, request a non-urgent call back, or ask for a work or school excuse from your e-visit provider.  You will get an email in the next two days asking about your experience.  I hope that your e-visit has been valuable and will speed your recovery.  Meds ordered this encounter  Medications   acyclovir (ZOVIRAX) 800 MG tablet    Sig: Take 1 tablet (800 mg total)   by mouth 2 (two) times daily for 7 days.    Dispense:  14 tablet    Refill:  0    I spent approximately 5 minutes reviewing the patient's history, current symptoms and coordinating their care today.   

## 2022-08-15 ENCOUNTER — Other Ambulatory Visit (HOSPITAL_COMMUNITY): Payer: Self-pay

## 2022-08-15 ENCOUNTER — Encounter (HOSPITAL_COMMUNITY): Payer: Self-pay

## 2022-08-18 ENCOUNTER — Other Ambulatory Visit: Payer: Self-pay

## 2022-08-20 ENCOUNTER — Other Ambulatory Visit: Payer: Self-pay

## 2022-08-20 ENCOUNTER — Emergency Department (HOSPITAL_COMMUNITY): Payer: 59

## 2022-08-20 ENCOUNTER — Emergency Department (HOSPITAL_COMMUNITY)
Admission: EM | Admit: 2022-08-20 | Discharge: 2022-08-20 | Disposition: A | Discharge status: Home / Self Care | Payer: 59 | Attending: Emergency Medicine | Admitting: Emergency Medicine

## 2022-08-20 ENCOUNTER — Encounter (HOSPITAL_COMMUNITY): Payer: Self-pay

## 2022-08-20 DIAGNOSIS — T07XXXA Unspecified multiple injuries, initial encounter: Secondary | ICD-10-CM | POA: Diagnosis not present

## 2022-08-20 DIAGNOSIS — Z7982 Long term (current) use of aspirin: Secondary | ICD-10-CM | POA: Insufficient documentation

## 2022-08-20 DIAGNOSIS — I517 Cardiomegaly: Secondary | ICD-10-CM | POA: Diagnosis not present

## 2022-08-20 DIAGNOSIS — R0602 Shortness of breath: Secondary | ICD-10-CM | POA: Diagnosis not present

## 2022-08-20 DIAGNOSIS — Z79899 Other long term (current) drug therapy: Secondary | ICD-10-CM | POA: Diagnosis not present

## 2022-08-20 DIAGNOSIS — Y92481 Parking lot as the place of occurrence of the external cause: Secondary | ICD-10-CM | POA: Insufficient documentation

## 2022-08-20 DIAGNOSIS — Z951 Presence of aortocoronary bypass graft: Secondary | ICD-10-CM | POA: Diagnosis not present

## 2022-08-20 DIAGNOSIS — R519 Headache, unspecified: Secondary | ICD-10-CM | POA: Insufficient documentation

## 2022-08-20 DIAGNOSIS — R110 Nausea: Secondary | ICD-10-CM | POA: Insufficient documentation

## 2022-08-20 DIAGNOSIS — I10 Essential (primary) hypertension: Secondary | ICD-10-CM | POA: Diagnosis not present

## 2022-08-20 DIAGNOSIS — R0682 Tachypnea, not elsewhere classified: Secondary | ICD-10-CM | POA: Diagnosis not present

## 2022-08-20 DIAGNOSIS — R0789 Other chest pain: Secondary | ICD-10-CM | POA: Diagnosis not present

## 2022-08-20 DIAGNOSIS — S50812A Abrasion of left forearm, initial encounter: Secondary | ICD-10-CM | POA: Insufficient documentation

## 2022-08-20 DIAGNOSIS — Z041 Encounter for examination and observation following transport accident: Secondary | ICD-10-CM | POA: Diagnosis not present

## 2022-08-20 DIAGNOSIS — S59912A Unspecified injury of left forearm, initial encounter: Secondary | ICD-10-CM | POA: Diagnosis present

## 2022-08-20 MED ORDER — NAPROXEN 375 MG PO TABS
375.0000 mg | ORAL_TABLET | Freq: Two times a day (BID) | ORAL | 0 refills | Status: DC
  Filled 2022-08-20 – 2022-08-24 (×6): qty 20, 10d supply, fill #0

## 2022-08-20 MED ORDER — METHOCARBAMOL 500 MG PO TABS
500.0000 mg | ORAL_TABLET | Freq: Two times a day (BID) | ORAL | 0 refills | Status: DC
  Filled 2022-08-20 – 2022-08-24 (×2): qty 20, 10d supply, fill #0

## 2022-08-20 MED ORDER — LIDOCAINE 5 % EX PTCH
1.0000 | MEDICATED_PATCH | CUTANEOUS | 0 refills | Status: DC
  Filled 2022-08-20 – 2022-08-24 (×2): qty 14, 14d supply, fill #0
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
## 2022-08-22 ENCOUNTER — Other Ambulatory Visit (HOSPITAL_BASED_OUTPATIENT_CLINIC_OR_DEPARTMENT_OTHER): Payer: Self-pay

## 2022-08-22 ENCOUNTER — Other Ambulatory Visit: Payer: Self-pay

## 2022-08-22 ENCOUNTER — Other Ambulatory Visit (HOSPITAL_COMMUNITY): Payer: Self-pay

## 2022-08-23 ENCOUNTER — Encounter (HOSPITAL_COMMUNITY): Payer: Self-pay

## 2022-08-23 ENCOUNTER — Other Ambulatory Visit (HOSPITAL_COMMUNITY): Payer: Self-pay

## 2022-08-24 ENCOUNTER — Other Ambulatory Visit: Payer: Self-pay

## 2022-08-24 ENCOUNTER — Other Ambulatory Visit (HOSPITAL_COMMUNITY): Payer: Self-pay

## 2022-08-25 ENCOUNTER — Other Ambulatory Visit: Payer: Self-pay

## 2022-08-25 ENCOUNTER — Other Ambulatory Visit (HOSPITAL_COMMUNITY): Payer: Self-pay

## 2022-08-29 ENCOUNTER — Other Ambulatory Visit (HOSPITAL_COMMUNITY): Payer: Self-pay

## 2022-08-31 ENCOUNTER — Other Ambulatory Visit: Payer: Self-pay

## 2022-09-12 IMAGING — DX DG CHEST 2V
2 series · 2 of 2 positions shown · non-contrast
Comparison: 03/09/2021

CLINICAL DATA: Follow-up small right pleural effusion

EXAM:
CHEST - 2 VIEW

[chest pa]
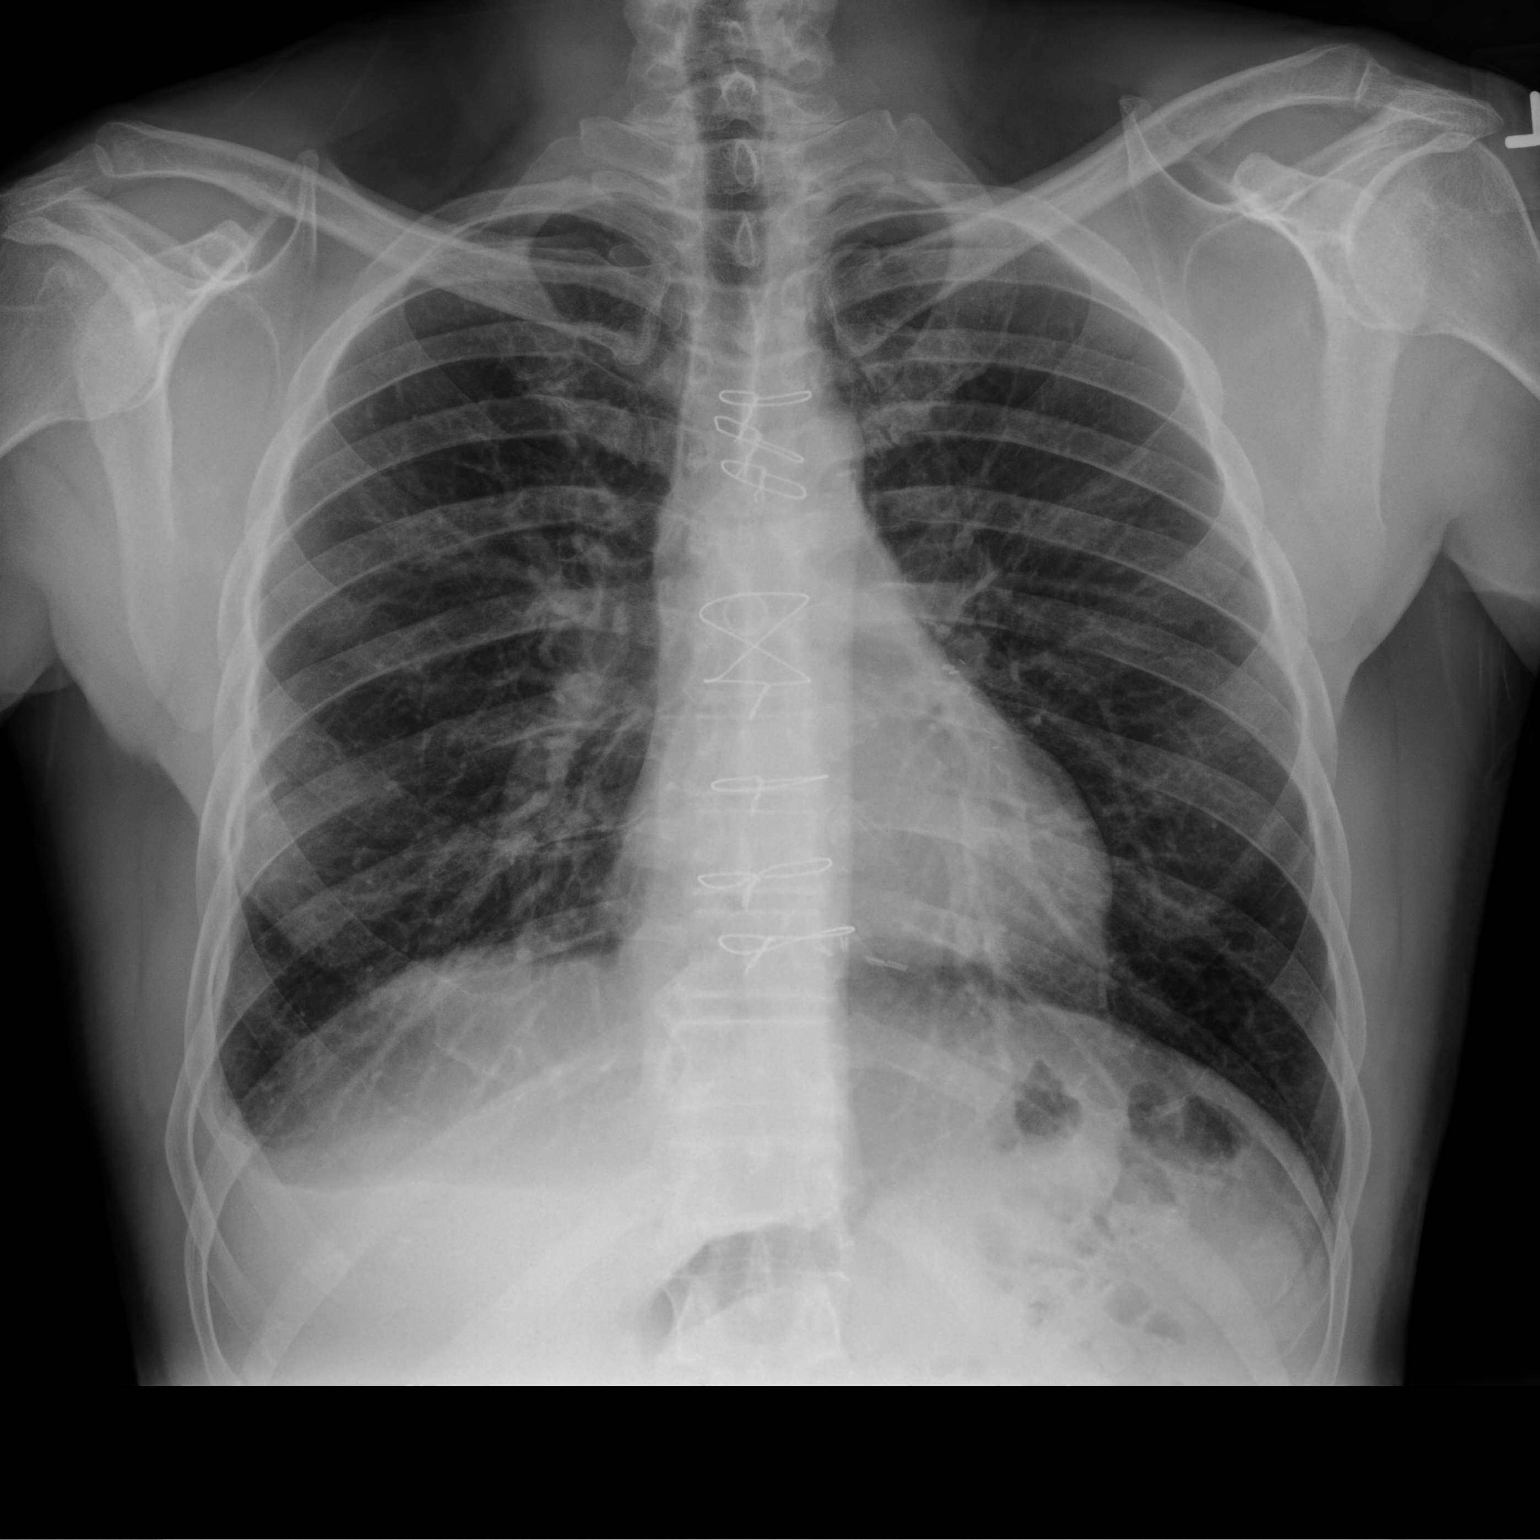

[chest lat]
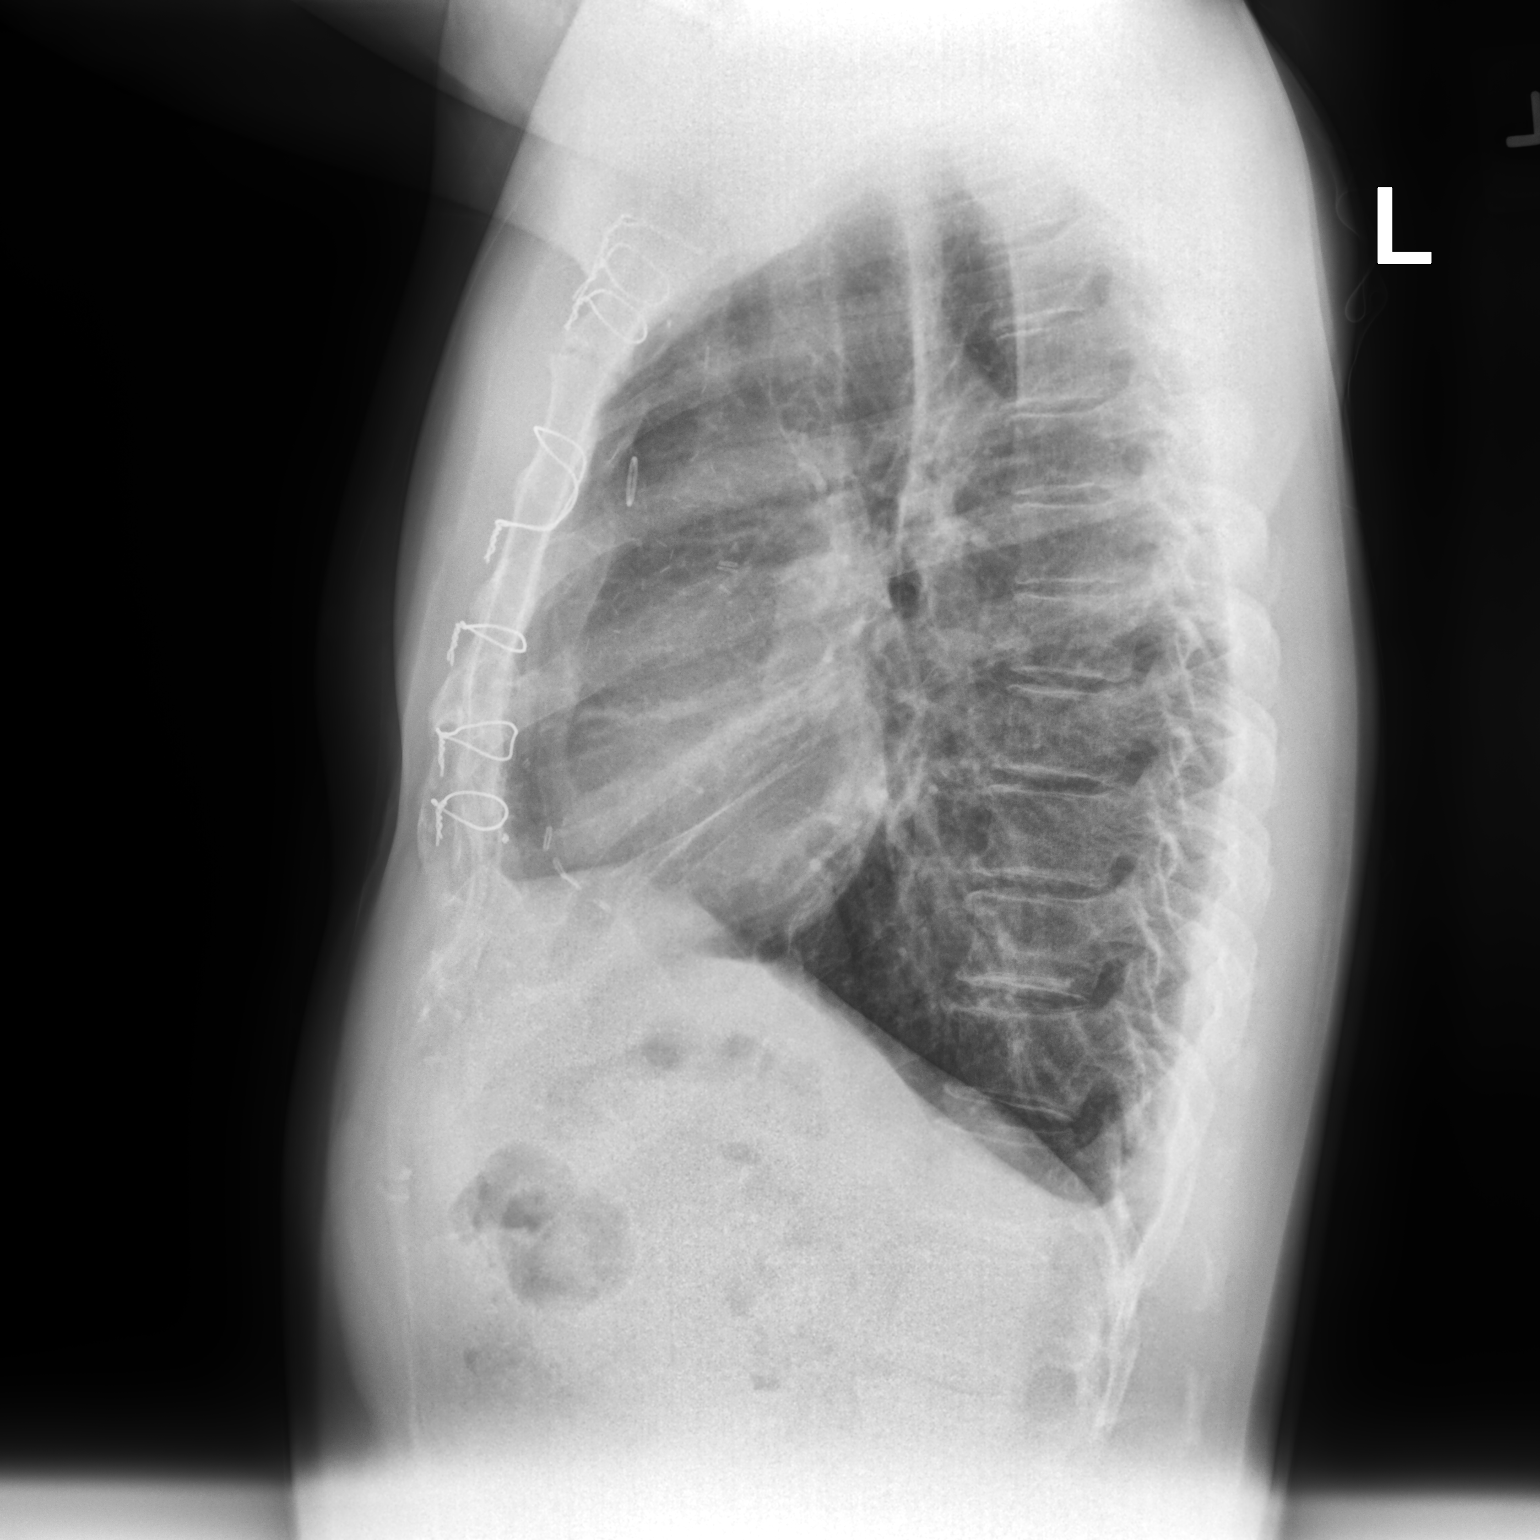

[2 of 2 positions shown; findings below may reference images not displayed]

FINDINGS: Cardiac shadow is within normal limits. Postsurgical changes are
again seen. Lungs are well aerated bilaterally. Minimal blunting of
the right costophrenic angle is noted stable in appearance likely
related to mild scarring. No new focal abnormality is seen.
IMPRESSION: Stable blunting of the right costophrenic angle. No new focal
abnormality is seen.

## 2022-09-28 ENCOUNTER — Other Ambulatory Visit: Payer: Self-pay | Admitting: Internal Medicine

## 2022-09-29 ENCOUNTER — Other Ambulatory Visit: Payer: Self-pay

## 2022-10-02 ENCOUNTER — Other Ambulatory Visit (HOSPITAL_COMMUNITY): Payer: Self-pay

## 2022-10-02 MED ORDER — REPATHA SURECLICK 140 MG/ML ~~LOC~~ SOAJ
140.0000 mg | SUBCUTANEOUS | 3 refills | Status: DC
  Filled 2022-10-02: qty 6, 84d supply, fill #0
  Filled 2023-01-12: qty 6, 84d supply, fill #1
  Filled 2023-04-09: qty 6, 84d supply, fill #2
  Filled 2023-07-02: qty 6, 84d supply, fill #3

## 2022-10-03 ENCOUNTER — Other Ambulatory Visit: Payer: Self-pay

## 2022-10-04 ENCOUNTER — Other Ambulatory Visit: Payer: Self-pay | Admitting: Cardiovascular Disease

## 2022-10-04 ENCOUNTER — Other Ambulatory Visit (HOSPITAL_COMMUNITY): Payer: Self-pay

## 2022-10-04 NOTE — Telephone Encounter (Signed)
*  STAT* If patient is at the pharmacy, call can be transferred to refill team.   1. Which medications need to be refilled? (please list name of each medication and dose if known)   Evolocumab (REPATHA SURECLICK) 353 MG/ML SOAJ   2. Which pharmacy/location (including street and city if local pharmacy) is medication to be sent to?  [place]   3. Do they need a 30 day or 90 day supply?   90 day  Patient stated he is completely out of this medication. Patient stated there is an insurance hold on this medication pending doctor's response.

## 2022-10-05 ENCOUNTER — Telehealth: Payer: Self-pay

## 2022-10-05 NOTE — Telephone Encounter (Signed)
Pharmacy Patient Advocate Encounter   Received notification from [facility] that prior authorization for Repatha 140MG /ML is required/requested.   PA submitted on 10/05/22 to (ins) [employer] via CoverMyMeds Key [id number] Status is pending

## 2022-10-06 NOTE — Telephone Encounter (Signed)
Pharmacy Patient Advocate Encounter  Prior Authorization for Repatha 140 MG/ML has been approved.    KEY# [id number] Effective dates: 2.9.24 through 2.8.25  Spoke with Pharmacy to process.

## 2022-10-08 DIAGNOSIS — R079 Chest pain, unspecified: Secondary | ICD-10-CM | POA: Diagnosis not present

## 2022-10-08 DIAGNOSIS — R110 Nausea: Secondary | ICD-10-CM | POA: Diagnosis not present

## 2022-10-08 DIAGNOSIS — R0789 Other chest pain: Secondary | ICD-10-CM | POA: Diagnosis not present

## 2022-10-11 ENCOUNTER — Other Ambulatory Visit (HOSPITAL_COMMUNITY): Payer: Self-pay

## 2022-10-12 ENCOUNTER — Other Ambulatory Visit (HOSPITAL_COMMUNITY): Payer: Self-pay

## 2022-10-13 ENCOUNTER — Other Ambulatory Visit (HOSPITAL_COMMUNITY): Payer: Self-pay

## 2022-10-18 ENCOUNTER — Other Ambulatory Visit (HOSPITAL_COMMUNITY): Payer: Self-pay

## 2022-10-24 ENCOUNTER — Ambulatory Visit: Payer: 59 | Attending: Cardiovascular Disease | Admitting: Cardiovascular Disease

## 2022-10-24 ENCOUNTER — Other Ambulatory Visit (HOSPITAL_COMMUNITY): Payer: Self-pay

## 2022-10-24 ENCOUNTER — Encounter: Payer: Self-pay | Admitting: Cardiovascular Disease

## 2022-10-24 VITALS — BP 128/72 | HR 55 | Ht 69.0 in | Wt 171.2 lb

## 2022-10-24 DIAGNOSIS — I10 Essential (primary) hypertension: Secondary | ICD-10-CM

## 2022-10-24 DIAGNOSIS — E785 Hyperlipidemia, unspecified: Secondary | ICD-10-CM

## 2022-10-24 DIAGNOSIS — F419 Anxiety disorder, unspecified: Secondary | ICD-10-CM | POA: Diagnosis not present

## 2022-10-24 DIAGNOSIS — I493 Ventricular premature depolarization: Secondary | ICD-10-CM

## 2022-10-24 DIAGNOSIS — I25708 Atherosclerosis of coronary artery bypass graft(s), unspecified, with other forms of angina pectoris: Secondary | ICD-10-CM | POA: Diagnosis not present

## 2022-10-24 MED ORDER — ALPRAZOLAM 1 MG PO TABS: 1.0000 mg | ORAL_TABLET | Freq: Every day | ORAL | 0 refills | Status: DC

## 2022-10-24 MED ORDER — ALPRAZOLAM 1 MG PO TABS
1.0000 mg | ORAL_TABLET | Freq: Every day | ORAL | 0 refills | Status: AC
  Filled 2022-10-24: qty 15, 15d supply, fill #0

## 2022-10-24 NOTE — Progress Notes (Signed)
Cardiology Office Note   Date:  10/24/2022   ID:  Michael Franklin, DOB [birth year], MRN [id number]  PCP:  Wellington Hampshire, MD  Cardiologist:   Kathlyn Sacramento, MD   No chief complaint on file.      History of Present Illness: Michael Franklin is a 47 y.o. male who presents for a follow-up visit regarding coronary artery disease status post CABG. He is one of our hospitalists.  He has known history of hyperlipidemia and family history of coronary artery disease.  His father had CABG in his 24s.  The patient has been very healthy throughout his life with excellent lifestyle and he plays soccer on a regular basis.  He did not tolerate atorvastatin in the past due to myalgia.  He presented in Oct 2021 with non-ST elevation myocardial infarction. EKG was unremarkable with the exception of possible septal Q waves.Echocardiogram showed an EF of 50 to 55% with mild mitral regurgitation.  Cardiac catheterization surprisingly showed severe three-vessel coronary artery disease with an occluded ostial LAD which was significantly calcified with collaterals noted from the right coronary artery, 95% stenosis in large ostial ramus and severe right PDA stenosis.  LVEDP was 24.  The patient underwent CABG by Dr. Kipp Brood.  The patient was referred to Dr. Debara Pickett to evaluate for premature atherosclerosis and was found to have significantly elevated LP(a) elevation.  The patient has been tolerating small dose Crestor and was started on Repatha.  He had symptomatic PVCs that initially responded to treatment with Toprol but he developed bradycardia with fatigue and thus, Toprol was discontinued.  He had recurrent chest pain since CABG with some intense episodes partially responded to nitroglycerin.  Repeat cardiac catheterization was done in March of 2022 which showed significant underlying three-vessel coronary artery disease with patent LIMA to LAD, patent radial to ramus and patent SVG to right PDA.  There was  moderate disease in the proximal portion of SVG to right PDA.  However, the right PDA itself was relatively supplying a small territory and the native RCA was not significantly diseased other than the right PDA.  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   He improved with colchicine.  unfortunately, he continues to be under significant stress at work and at home.  In that setting, he experiences intense chest pain that usually responds to sublingual nitroglycerin.  No exertional symptoms.  He had an emergency room visit 2 weeks ago for left-sided chest pain with negative troponin.  Past Medical History:  Diagnosis Date   Hyperlipidemia    NSTEMI (non-ST elevated myocardial infarction) [facility])     Past Surgical History:  Procedure Laterality Date   CORONARY ARTERY BYPASS GRAFT N/A 06/15/2020   Procedure: CORONARY ARTERY BYPASS GRAFTING (CABG) TIMES THREE USING HARVESTED LEFT RADIAL ARTERY, LEFT INTERNAL MAMMARY ARTERY,RIGHT GREATER SAPHENOUS VEIN,HARVESTED ENDOSCOPICALLY. Removal of balloon pump.;  Surgeon: Lajuana Matte, MD;  Location: [place];  Service: Open Heart Surgery;  Laterality: N/A;   CORONARY/GRAFT ACUTE MI REVASCULARIZATION N/A 06/14/2020   Procedure: Coronary/Graft Acute MI Revascularization;  Surgeon: Wellington Hampshire, MD;  Location: [place] CV [facility];  Service: Cardiovascular;  Laterality: N/A;   IABP INSERTION N/A 06/15/2020   Procedure: IABP Insertion;  Surgeon: Wellington Hampshire, MD;  Location: [place] [facility];  Service: Cardiovascular;  Laterality: N/A;   LEFT HEART CATH AND CORONARY ANGIOGRAPHY N/A 06/14/2020   Procedure: LEFT HEART CATH AND CORONARY ANGIOGRAPHY;  Surgeon: Kathlyn Sacramento  A, MD;  Location: [place] [facility];  Service: Cardiovascular;  Laterality: N/A;   LEFT HEART CATH AND CORS/GRAFTS ANGIOGRAPHY N/A 11/05/2020   Procedure: LEFT HEART CATH AND CORS/GRAFTS ANGIOGRAPHY;  Surgeon: Wellington Hampshire, MD;  Location: [place] [facility];  Service: Cardiovascular;  Laterality: N/A;   TEE WITHOUT CARDIOVERSION N/A 06/15/2020   Procedure: TRANSESOPHAGEAL ECHOCARDIOGRAM (TEE);  Surgeon: Lajuana Matte, MD;  Location: [place];  Service: Open Heart Surgery;  Laterality: N/A;     Current Outpatient Medications  Medication Sig Dispense Refill   amLODipine (NORVASC) 5 MG tablet Take 1 tablet (5 mg total) by mouth daily. 90 tablet 3   aspirin EC 81 MG tablet Take 1 tablet (81 mg total) by mouth daily. Swallow whole. 90 tablet 3   Blood Pressure Monitoring (OMRON 3 SERIES BP MONITOR) DEVI Use as directed 1 each 0   Evolocumab (REPATHA SURECLICK) [id number] MG/ML SOAJ Inject 140 mg into the skin every 14 days. 6 mL 3   levocetirizine (XYZAL) 5 MG tablet Take 1 tablet (5 mg total) by mouth every evening. 90 tablet 3   nitroGLYCERIN (NITROSTAT) 0.4 MG SL tablet Place 1 tablet under the tongue every 5 minutes as needed for chest pain. 25 tablet 6   rosuvastatin (CRESTOR) 5 MG tablet Take 1 tablet (5 mg total) by mouth daily. 90 tablet 2   zolpidem (AMBIEN) 5 MG tablet Take 1 tablet by mouth at bedtime as needed for insomnia. 30 tablet 4   lidocaine (LIDODERM) 5 % Place 1 patch onto the skin daily. Remove & Discard patch within 12 hours or as directed by MD 14 patch 0   methocarbamol (ROBAXIN) 500 MG tablet Take 1 tablet (500 mg total) by mouth 2 (two) times daily. 20 tablet 0   naproxen (NAPROSYN) 375 MG tablet Take 1 tablet (375 mg total) by mouth 2 (two) times daily. 20 tablet 0   sildenafil (VIAGRA) 100 MG tablet Take 1 tablet (100 mg total) by mouth daily as needed for erectile dysfunction. 90 tablet 3   tadalafil (CIALIS) 5 MG tablet Take 1 tablet (5 mg total) by mouth daily. 30 tablet 11   zolpidem (AMBIEN) 5 MG tablet Take 1 tablet (5 mg total) by mouth at bedtime as needed. 30 tablet 4   No current facility-administered medications for this visit.    Allergies:   Atorvastatin, Crestor [rosuvastatin], and Penicillins     Social History:  The patient  reports that he has never smoked. He has never used smokeless tobacco. He reports that he does not drink alcohol and does not use drugs.   Family History:  The patient's family history includes Asthma in his mother; CAD in his father; Hyperlipidemia in his father; Hypertension in his father.    ROS:  Please see the history of present illness.   Otherwise, review of systems are positive for none.   All other systems are reviewed and negative.    PHYSICAL EXAM: VS:  BP 128/72   Pulse (!) 55   Ht '5\' 9"'$  (1.753 m)   Wt 171 lb 3.2 oz (77.7 kg)   BMI 25.28 kg/m  , BMI Body mass index is 25.28 kg/m. GEN: Well nourished, well developed, in no acute distress  HEENT: normal  Neck: no JVD, carotid bruits, or masses Cardiac: RRR; no murmurs, rubs, or gallops,no edema  Respiratory:  clear to auscultation bilaterally, normal work of breathing GI: soft, nontender, nondistended, + BS MS: no deformity or atrophy  Skin: warm and dry, no rash Neuro:  Strength and sensation are intact Psych: euthymic mood, full affect   EKG:  EKG is ordered today. The ekg ordered today demonstrates sinus bradycardia with possible right atrial enlargement.  No significant ST or T wave changes.   Recent Labs: 01/10/2022: TSH 1.640 06/30/2022: ALT 17; BUN 15; Creatinine, Ser 1.08; Hemoglobin 15.1; Platelets 254; Potassium 4.8; Sodium 138    Lipid Panel    Component Value Date/Time   CHOL 89 (L) 01/10/2022 1021   TRIG 115 01/10/2022 1021   HDL 47 01/10/2022 1021   CHOLHDL 1.9 01/10/2022 1021   CHOLHDL 4.5 06/14/2020 1729   VLDL 13 06/14/2020 1729   LDLCALC 21 01/10/2022 1021      Wt Readings from Last 3 Encounters:  10/24/22 171 lb 3.2 oz (77.7 kg)  08/20/22 175 lb (79.4 kg)  04/25/22 175 lb (79.4 kg)           No data to display            ASSESSMENT AND PLAN:  1.  Coronary artery disease involving native coronary arteries with other forms of angina:  Intermittent episodes of chest pain at rest that responds to nitroglycerin.  Possible coronary spasm.  Most of these episodes are triggered by stress and anxiety. I provided him with 15 tablets of Xanax to be used as needed.  2.  Hyperlipidemia: Continue treatment with small dose rosuvastatin and Repatha.  Most recent lipid profile showed an LDL of 30.  He has known history of elevated LP(a) which improved with Repatha treatment.  3.  Essential hypertension: Continue treatment with amlodipine.  Blood pressure is well-controlled.  4.  PVCs: Resolved.  He is off Toprol.  5.  Anxiety: The patient has significant stress from workload as well as stress at home.  I have advised him for years to cut down on some of his work responsibilities as the current situation does not seem to be sustainable from a cardiac standpoint.  I again discussed this with him.  Frequent episodes of chest pain seem to correlate with increased stress and anxiety.    Disposition:   FU with me in 6 months  Signed,  Kathlyn Sacramento, MD  10/24/2022 8:03 AM    [place]

## 2022-10-24 NOTE — Patient Instructions (Signed)
Medication Instructions:  Duanne Moron: take 1 mg tablet daily as needed *If you need a refill on your cardiac medications before your next appointment, please call your pharmacy*   Lab Work: None ordered If you have labs (blood work) drawn today and your tests are completely normal, you will receive your results only by: [place] (if you have MyChart) OR A paper copy in the mail If you have any lab test that is abnormal or we need to change your treatment, we will call you to review the results.   Testing/Procedures: None ordered   Follow-Up: At [facility], you and your health needs are our priority.  As part of our continuing mission to provide you with exceptional heart care, we have created designated Provider Care Teams.  These Care Teams include your primary Cardiologist (physician) and Advanced Practice Providers (APPs -  Physician Assistants and Nurse Practitioners) who all work together to provide you with the care you need, when you need it.  We recommend signing up for the patient portal called "MyChart".  Sign up information is provided on this After Visit Summary.  MyChart is used to connect with patients for Virtual Visits (Telemedicine).  Patients are able to view lab/test results, encounter notes, upcoming appointments, etc.  Non-urgent messages can be sent to your provider as well.   To learn more about what you can do with MyChart, go to [email or web address].    Your next appointment:   6 month(s)  Provider:   Kathlyn Sacramento, MD

## 2022-12-05 ENCOUNTER — Other Ambulatory Visit (HOSPITAL_COMMUNITY): Payer: Self-pay

## 2023-01-12 ENCOUNTER — Other Ambulatory Visit: Payer: Self-pay

## 2023-01-12 ENCOUNTER — Other Ambulatory Visit (HOSPITAL_COMMUNITY): Payer: Self-pay

## 2023-01-15 ENCOUNTER — Other Ambulatory Visit (HOSPITAL_COMMUNITY): Payer: Self-pay

## 2023-01-17 NOTE — Telephone Encounter (Signed)
Yes that is fine it is okay to refill but he probably needs a written prescription given that my online authentication is not working.

## 2023-01-24 NOTE — Telephone Encounter (Signed)
Message sent to patient to see if he would like a refill.

## 2023-01-26 ENCOUNTER — Other Ambulatory Visit (HOSPITAL_COMMUNITY): Payer: Self-pay

## 2023-01-29 ENCOUNTER — Other Ambulatory Visit: Payer: Self-pay

## 2023-01-29 ENCOUNTER — Other Ambulatory Visit (HOSPITAL_COMMUNITY): Payer: Self-pay

## 2023-01-29 MED ORDER — ALPRAZOLAM 1 MG PO TABS
1.0000 mg | ORAL_TABLET | Freq: Every day | ORAL | 0 refills | Status: AC
  Filled 2023-01-29: qty 15, 15d supply, fill #0

## 2023-04-09 ENCOUNTER — Other Ambulatory Visit: Payer: Self-pay

## 2023-04-09 ENCOUNTER — Other Ambulatory Visit (HOSPITAL_COMMUNITY): Payer: Self-pay

## 2023-04-10 ENCOUNTER — Other Ambulatory Visit (HOSPITAL_COMMUNITY): Payer: Self-pay

## 2023-04-21 ENCOUNTER — Encounter: Payer: Self-pay | Admitting: Cardiovascular Disease

## 2023-04-23 NOTE — Progress Notes (Unsigned)
Cardiology Office Note   Date:  04/24/2023   ID:  Michael Franklin, DOB [birth year], MRN [id number]  PCP:  Michael Ouch, MD  Cardiologist:   Michael Bears, MD   No chief complaint on file.      History of Present Illness: Michael Franklin is a 47 y.o. male who presents for a follow-up visit regarding coronary artery disease status post CABG. He is one of our hospitalists.  He has known history of hyperlipidemia and family history of coronary artery disease.  His father had CABG in his 58s.  The patient has been very healthy throughout his life with excellent lifestyle and he plays soccer on a regular basis.  He did not tolerate atorvastatin in the past due to myalgia.  He presented in Oct 2021 with non-ST elevation myocardial infarction. EKG was unremarkable with the exception of possible septal Q waves.Echocardiogram showed an EF of 50 to 55% with mild mitral regurgitation.  Cardiac catheterization surprisingly showed severe three-vessel coronary artery disease with an occluded ostial LAD which was significantly calcified with collaterals noted from the right coronary artery, 95% stenosis in large ostial ramus and severe right PDA stenosis.  LVEDP was 24.  The patient underwent CABG by Dr. Cliffton Asters.  The patient was referred to Dr. Rennis Golden to evaluate for premature atherosclerosis and was found to have significantly elevated LP(a) elevation.  The patient has been tolerating small dose Crestor and was started on Repatha.  He had symptomatic PVCs that initially responded to treatment with Toprol but he developed bradycardia with fatigue and thus, Toprol was discontinued.  He had recurrent chest pain since CABG with some intense episodes partially responded to nitroglycerin.  Repeat cardiac catheterization was done in March of 2022 which showed significant underlying three-vessel coronary artery disease with patent LIMA to LAD, patent radial to ramus and patent SVG to right PDA.  There was  moderate disease in the proximal portion of SVG to right PDA.  However, the right PDA itself was relatively supplying a small territory and the native RCA was not significantly diseased other than the right PDA.  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   He improved with colchicine.  He has been doing reasonably well overall.  He has intermittent chest pain at rest that sometimes require sublingual nitroglycerin.  He has no exertional chest pain or shortness of breath even when he plays soccer.  Past Medical History:  Diagnosis Date   Hyperlipidemia    NSTEMI (non-ST elevated myocardial infarction) [facility])     Past Surgical History:  Procedure Laterality Date   CORONARY ARTERY BYPASS GRAFT N/A 06/15/2020   Procedure: CORONARY ARTERY BYPASS GRAFTING (CABG) TIMES THREE USING HARVESTED LEFT RADIAL ARTERY, LEFT INTERNAL MAMMARY ARTERY,RIGHT GREATER SAPHENOUS VEIN,HARVESTED ENDOSCOPICALLY. Removal of balloon pump.;  Surgeon: Corliss Skains, MD;  Location: [facility];  Service: Open Heart Surgery;  Laterality: N/A;   CORONARY/GRAFT ACUTE MI REVASCULARIZATION N/A 06/14/2020   Procedure: Coronary/Graft Acute MI Revascularization;  Surgeon: Michael Ouch, MD;  Location: [facility] INV[facility];  Service: Cardiovascular;  Laterality: N/A;   IABP INSERTION N/A 06/15/2020   Procedure: IABP Insertion;  Surgeon: Michael Ouch, MD;  Location: [facility] IN[facility];  Service: Cardiovascular;  Laterality: N/A;   LEFT HEART CATH AND CORONARY ANGIOGRAPHY N/A 06/14/2020   Procedure: LEFT HEART CATH AND CORONARY ANGIOGRAPHY;  Surgeon: Michael Ouch, MD;  Location: [facility] IN[facility];  Service: Cardiovascular;  Laterality:  N/A;   LEFT HEART CATH AND CORS/GRAFTS ANGIOGRAPHY N/A 11/05/2020   Procedure: LEFT HEART CATH AND CORS/GRAFTS ANGIOGRAPHY;  Surgeon: Michael Ouch, MD;  Location: [facility];  Service: Cardiovascular;  Laterality: N/A;   TEE WITHOUT  CARDIOVERSION N/A 06/15/2020   Procedure: TRANSESOPHAGEAL ECHOCARDIOGRAM (TEE);  Surgeon: Corliss Skains, MD;  Location: [facility] OR;  Service: Open Heart Surgery;  Laterality: N/A;     Current Outpatient Medications  Medication Sig Dispense Refill   ALPRAZolam (XANAX) 1 MG tablet Take 1 tablet (1 mg total) by mouth daily. 15 tablet 0   ALPRAZolam (XANAX) 1 MG tablet Take 1 tablet (1 mg total) by mouth daily. 15 tablet 0   amLODipine (NORVASC) 5 MG tablet Take 1 tablet (5 mg total) by mouth daily. 90 tablet 3   aspirin EC 81 MG tablet Take 1 tablet (81 mg total) by mouth daily. Swallow whole. 90 tablet 3   Evolocumab (REPATHA SURECLICK) 140 MG/ML SOAJ Inject 140 mg into the skin every 14 days. 6 mL 3   levocetirizine (XYZAL) 5 MG tablet Take 1 tablet (5 mg total) by mouth every evening. 90 tablet 3   nitroGLYCERIN (NITROSTAT) 0.4 MG SL tablet Place 1 tablet under the tongue every 5 minutes as needed for chest pain. 25 tablet 6   rosuvastatin (CRESTOR) 5 MG tablet Take 1 tablet (5 mg total) by mouth daily. 90 tablet 2   sildenafil (VIAGRA) 100 MG tablet Take 1 tablet (100 mg total) by mouth daily as needed for erectile dysfunction. 90 tablet 3   tadalafil (CIALIS) 5 MG tablet Take 1 tablet (5 mg total) by mouth daily. 30 tablet 11   zolpidem (AMBIEN) 5 MG tablet Take 1 tablet by mouth at bedtime as needed for insomnia. 30 tablet 4   zolpidem (AMBIEN) 5 MG tablet Take 1 tablet (5 mg total) by mouth at bedtime as needed. 30 tablet 4   Blood Pressure Monitoring (OMRON 3 SERIES BP MONITOR) DEVI Use as directed 1 each 0   No current facility-administered medications for this visit.    Allergies:   Atorvastatin, Crestor [rosuvastatin], and Penicillins    Social History:  The patient  reports that he has never smoked. He has never used smokeless tobacco. He reports that he does not drink alcohol and does not use drugs.   Family History:  The patient's family history includes Asthma in his  mother; CAD in his father; Hyperlipidemia in his father; Hypertension in his father.    ROS:  Please see the history of present illness.   Otherwise, review of systems are positive for none.   All other systems are reviewed and negative.    PHYSICAL EXAM: VS:  BP 120/74 (BP Location: Right Arm, Patient Position: Sitting)   Pulse (!) 48   Ht 5\' 9"  (1.753 m)   Wt 173 lb 6.4 oz (78.7 kg)   BMI 25.61 kg/m  , BMI Body mass index is 25.61 kg/m. GEN: Well nourished, well developed, in no acute distress  HEENT: normal  Neck: no JVD, carotid bruits, or masses Cardiac: RRR; no murmurs, rubs, or gallops,no edema  Respiratory:  clear to auscultation bilaterally, normal work of breathing GI: soft, nontender, nondistended, + BS MS: no deformity or atrophy  Skin: warm and dry, no rash Neuro:  Strength and sensation are intact Psych: euthymic mood, full affect      Recent Labs: 06/30/2022: ALT 17; BUN 15; Creatinine, Ser 1.08; Hemoglobin 15.1; Platelets 254; Potassium 4.8;  Sodium 138    Lipid Panel    Component Value Date/Time   CHOL 89 (L) 01/10/2022 1021   TRIG 115 01/10/2022 1021   HDL 47 01/10/2022 1021   CHOLHDL 1.9 01/10/2022 1021   CHOLHDL 4.5 06/14/2020 1729   VLDL 13 06/14/2020 1729   LDLCALC 21 01/10/2022 1021      Wt Readings from Last 3 Encounters:  04/24/23 173 lb 6.4 oz (78.7 kg)  10/24/22 171 lb 3.2 oz (77.7 kg)  08/20/22 175 lb (79.4 kg)           No data to display            ASSESSMENT AND PLAN:  1.  Coronary artery disease involving native coronary arteries with other forms of angina: Intermittent episodes of chest pain at rest that responds to nitroglycerin.  Possible coronary spasm.  His symptoms improved compared to before and currently with no exertional chest pain or shortness of breath.  Continue medical therapy.    2.  Hyperlipidemia: Continue treatment with small dose rosuvastatin and Repatha.  Most recent lipid profile showed an LDL of 30.   He does not take rosuvastatin all the time due to myalgia. however, he is consistent with Repatha.  I requested follow-up lipid profile and LP(a).  3.  Essential hypertension: Continue treatment with amlodipine.  Blood pressure is well-controlled.  4.  PVCs: Resolved.  He is off Toprol.  5.  Anxiety: Slight improvement from before.   Disposition:   FU with me in 6 months  Signed,  Michael Bears, MD  04/24/2023 8:33 AM    [facility]

## 2023-04-24 ENCOUNTER — Encounter: Payer: Self-pay | Admitting: Cardiovascular Disease

## 2023-04-24 ENCOUNTER — Other Ambulatory Visit (HOSPITAL_COMMUNITY): Payer: Self-pay

## 2023-04-24 ENCOUNTER — Ambulatory Visit: Payer: 59 | Attending: Cardiovascular Disease | Admitting: Cardiovascular Disease

## 2023-04-24 DIAGNOSIS — I10 Essential (primary) hypertension: Secondary | ICD-10-CM | POA: Diagnosis not present

## 2023-04-24 DIAGNOSIS — E785 Hyperlipidemia, unspecified: Secondary | ICD-10-CM | POA: Diagnosis not present

## 2023-04-24 DIAGNOSIS — I493 Ventricular premature depolarization: Secondary | ICD-10-CM | POA: Diagnosis not present

## 2023-04-24 DIAGNOSIS — I25708 Atherosclerosis of coronary artery bypass graft(s), unspecified, with other forms of angina pectoris: Secondary | ICD-10-CM

## 2023-04-24 MED ORDER — NITROGLYCERIN 0.4 MG SL SUBL
0.4000 mg | SUBLINGUAL_TABLET | SUBLINGUAL | 6 refills | Status: DC | PRN
  Filled 2023-04-24: qty 25, 7d supply, fill #0

## 2023-04-24 NOTE — Patient Instructions (Signed)
Medication Instructions:  No changes *If you need a refill on your cardiac medications before your next appointment, please call your pharmacy*   Lab Work: Your provider would like for you to have the following labs today: CMET, Lipid, LPa, A1C  If you have labs (blood work) drawn today and your tests are completely normal, you will receive your results only by: MyChart Message (if you have MyChart) OR A paper copy in the mail If you have any lab test that is abnormal or we need to change your treatment, we will call you to review the results.   Testing/Procedures: None ordered   Follow-Up: At [facility], you and your health needs are our priority.  As part of our continuing mission to provide you with exceptional heart care, we have created designated Provider Care Teams.  These Care Teams include your primary Cardiologist (physician) and Advanced Practice Providers (APPs -  Physician Assistants and Nurse Practitioners) who all work together to provide you with the care you need, when you need it.  We recommend signing up for the patient portal called "MyChart".  Sign up information is provided on this After Visit Summary.  MyChart is used to connect with patients for Virtual Visits (Telemedicine).  Patients are able to view lab/test results, encounter notes, upcoming appointments, etc.  Non-urgent messages can be sent to your provider as well.   To learn more about what you can do with MyChart, go to [email or web address].    Your next appointment:   6 month(s)  Provider:   Dr. Kirke Corin

## 2023-04-25 LAB — COMPREHENSIVE METABOLIC PANEL
ALT: 14 IU/L (ref 0–44)
AST: 20 IU/L (ref 0–40)
Albumin: 4.7 g/dL (ref 4.1–5.1)
Alkaline Phosphatase: 72 IU/L (ref 44–121)
BUN/Creatinine Ratio: 13 (ref 9–20)
BUN: 16 mg/dL (ref 6–24)
Bilirubin Total: 0.4 mg/dL (ref 0.0–1.2)
CO2: 27 mmol/L (ref 20–29)
Calcium: 9.5 mg/dL (ref 8.7–10.2)
Chloride: 102 mmol/L (ref 96–106)
Creatinine, Ser: 1.24 mg/dL (ref 0.76–1.27)
Globulin, Total: 2.4 g/dL (ref 1.5–4.5)
Glucose: 103 mg/dL — ABNORMAL HIGH (ref 70–99)
Potassium: 4.6 mmol/L (ref 3.5–5.2)
Sodium: 138 mmol/L (ref 134–144)
Total Protein: 7.1 g/dL (ref 6.0–8.5)
eGFR: 71 mL/min/{1.73_m2} (ref >59)

## 2023-04-25 LAB — HEMOGLOBIN A1C
Est. average glucose Bld gHb Est-mCnc: 123 mg/dL
Hgb A1c MFr Bld: 5.9 % — ABNORMAL HIGH (ref 4.8–5.6)

## 2023-04-25 LAB — LIPOPROTEIN A (LPA): Lipoprotein (a): 143.2 nmol/L — ABNORMAL HIGH (ref <75.0)

## 2023-04-25 LAB — LIPID PANEL
Chol/HDL Ratio: 2.5 ratio (ref 0.0–5.0)
Cholesterol, Total: 117 mg/dL (ref 100–199)
HDL: 46 mg/dL (ref >39)
LDL Chol Calc (NIH): 43 mg/dL (ref 0–99)
Triglycerides: 173 mg/dL — ABNORMAL HIGH (ref 0–149)
VLDL Cholesterol Cal: 28 mg/dL (ref 5–40)

## 2023-05-07 ENCOUNTER — Other Ambulatory Visit (HOSPITAL_COMMUNITY): Payer: Self-pay

## 2023-05-07 DIAGNOSIS — F5101 Primary insomnia: Secondary | ICD-10-CM | POA: Diagnosis not present

## 2023-05-07 DIAGNOSIS — F4323 Adjustment disorder with mixed anxiety and depressed mood: Secondary | ICD-10-CM | POA: Diagnosis not present

## 2023-05-07 MED ORDER — ALPRAZOLAM 1 MG PO TABS
1.0000 mg | ORAL_TABLET | Freq: Every day | ORAL | 5 refills | Status: AC
  Filled 2023-05-07 – 2023-05-28 (×2): qty 30, 30d supply, fill #0

## 2023-05-17 ENCOUNTER — Other Ambulatory Visit (HOSPITAL_COMMUNITY): Payer: Self-pay

## 2023-05-19 ENCOUNTER — Other Ambulatory Visit (HOSPITAL_COMMUNITY): Payer: Self-pay

## 2023-05-28 ENCOUNTER — Other Ambulatory Visit (HOSPITAL_COMMUNITY): Payer: Self-pay

## 2023-05-28 ENCOUNTER — Other Ambulatory Visit: Payer: Self-pay | Admitting: Cardiovascular Disease

## 2023-05-29 ENCOUNTER — Other Ambulatory Visit: Payer: Self-pay

## 2023-05-29 ENCOUNTER — Other Ambulatory Visit (HOSPITAL_COMMUNITY): Payer: Self-pay

## 2023-05-29 MED ORDER — AMLODIPINE BESYLATE 5 MG PO TABS
5.0000 mg | ORAL_TABLET | Freq: Every day | ORAL | 3 refills | Status: DC
  Filled 2023-05-29: qty 90, 90d supply, fill #0
  Filled 2024-01-11: qty 90, 90d supply, fill #2
  Filled 2024-04-10: qty 90, 90d supply, fill #3

## 2023-05-29 NOTE — Telephone Encounter (Signed)
Refill request

## 2023-06-20 ENCOUNTER — Encounter: Payer: Self-pay | Admitting: Urology

## 2023-06-20 ENCOUNTER — Ambulatory Visit: Payer: 59 | Admitting: Urology

## 2023-06-20 VITALS — BP 123/68 | HR 51

## 2023-06-20 DIAGNOSIS — N529 Male erectile dysfunction, unspecified: Secondary | ICD-10-CM

## 2023-06-20 DIAGNOSIS — N400 Enlarged prostate without lower urinary tract symptoms: Secondary | ICD-10-CM | POA: Diagnosis not present

## 2023-06-20 DIAGNOSIS — R339 Retention of urine, unspecified: Secondary | ICD-10-CM

## 2023-06-20 LAB — URINALYSIS, ROUTINE W REFLEX MICROSCOPIC
Bilirubin, UA: NEGATIVE
Glucose, UA: NEGATIVE
Ketones, UA: NEGATIVE
Leukocytes,UA: NEGATIVE
Nitrite, UA: NEGATIVE
Protein,UA: NEGATIVE
RBC, UA: NEGATIVE
Specific Gravity, UA: 1.01 (ref 1.005–1.030)
Urobilinogen, Ur: 0.2 mg/dL (ref 0.2–1.0)
pH, UA: 7 (ref 5.0–7.5)

## 2023-06-20 NOTE — Progress Notes (Signed)
06/20/2023 10:55 AM   Alton Hartung 02/04/1976 [id number]  Referring provider: Iran Ouch, MD [address] [place],  [place] [address]  Followup BPH and erectile dysfunction.    HPI: Michael Franklin is a 47yo here for followup for BPH and erectile dysfunction. He notes the tadalafil is causing GERD and then he has severe urinary urgency. IPSS 13 QOL 2. He uses tadalafil prn with good results   PMH: Past Medical History:  Diagnosis Date   Hyperlipidemia    NSTEMI (non-ST elevated myocardial infarction) [facility])     Surgical History: Past Surgical History:  Procedure Laterality Date   CORONARY ARTERY BYPASS GRAFT N/A 06/15/2020   Procedure: CORONARY ARTERY BYPASS GRAFTING (CABG) TIMES THREE USING HARVESTED LEFT RADIAL ARTERY, LEFT INTERNAL MAMMARY ARTERY,RIGHT GREATER SAPHENOUS VEIN,HARVESTED ENDOSCOPICALLY. Removal of balloon pump.;  Surgeon: Corliss Skains, MD;  Location: [facility] OR;  Service: Open Heart Surgery;  Laterality: N/A;   CORONARY/GRAFT ACUTE MI REVASCULARIZATION N/A 06/14/2020   Procedure: Coronary/Graft Acute MI Revascularization;  Surgeon: Iran Ouch, MD;  Location: [facility] INVASIVE CV LAB;  Service: Cardiovascular;  Laterality: N/A;   IABP INSERTION N/A 06/15/2020   Procedure: IABP Insertion;  Surgeon: Iran Ouch, MD;  Location: [facility] INVASIVE CV LAB;  Service: Cardiovascular;  Laterality: N/A;   LEFT HEART CATH AND CORONARY ANGIOGRAPHY N/A 06/14/2020   Procedure: LEFT HEART CATH AND CORONARY ANGIOGRAPHY;  Surgeon: Iran Ouch, MD;  Location: [facility] INVASIVE CV LAB;  Service: Cardiovascular;  Laterality: N/A;   LEFT HEART CATH AND CORS/GRAFTS ANGIOGRAPHY N/A 11/05/2020   Procedure: LEFT HEART CATH AND CORS/GRAFTS ANGIOGRAPHY;  Surgeon: Iran Ouch, MD;  Location: [facility] INVASIVE CV LAB;  Service: Cardiovascular;  Laterality: N/A;   TEE WITHOUT CARDIOVERSION N/A 06/15/2020   Procedure: TRANSESOPHAGEAL ECHOCARDIOGRAM (TEE);  Surgeon:  Corliss Skains, MD;  Location: [facility] OR;  Service: Open Heart Surgery;  Laterality: N/A;    Home Medications:  Allergies as of 06/20/2023       Reactions   Atorvastatin    myalgias   Crestor [rosuvastatin]    higher doses cause myalgias   Penicillins         Medication List        Accurate as of June 20, 2023 10:55 AM. If you have any questions, ask your nurse or doctor.          ALPRAZolam 1 MG tablet Commonly known as: XANAX Take 1 tablet (1 mg total) by mouth daily.   ALPRAZolam 1 MG tablet Commonly known as: XANAX Take 1 tablet (1 mg total) by mouth daily.   ALPRAZolam 1 MG tablet Commonly known as: XANAX Take 1 tablet (1 mg total) by mouth nightly as needed for anxiety/sleep   amLODipine 5 MG tablet Commonly known as: NORVASC Take 1 tablet (5 mg total) by mouth daily.   aspirin EC 81 MG tablet Take 1 tablet (81 mg total) by mouth daily. Swallow whole.   levocetirizine 5 MG tablet Commonly known as: XYZAL Take 1 tablet (5 mg total) by mouth every evening.   nitroGLYCERIN 0.4 MG SL tablet Commonly known as: NITROSTAT Place 1 tablet under the tongue every 5 minutes as needed for chest pain.  If no relief after 1st dose, call 911.   Omron 3 Series BP Monitor Devi Use as directed   Repatha SureClick 140 MG/ML Soaj Generic drug: Evolocumab Inject 140 mg into the skin every 14 days.   rosuvastatin 5 MG tablet Commonly known  as: CRESTOR Take 1 tablet (5 mg total) by mouth daily.   sildenafil 100 MG tablet Commonly known as: VIAGRA Take 1 tablet (100 mg total) by mouth daily as needed for erectile dysfunction.   tadalafil 5 MG tablet Commonly known as: CIALIS Take 1 tablet (5 mg total) by mouth daily.   zolpidem 5 MG tablet Commonly known as: AMBIEN Take 1 tablet by mouth at bedtime as needed for insomnia.   zolpidem 5 MG tablet Commonly known as: AMBIEN Take 1 tablet by mouth at bedtime as needed for insomnia. (Take 1 tablet (5 mg  total) by mouth at bedtime as needed.)        Allergies:  Allergies  Allergen Reactions   Atorvastatin     myalgias   Crestor [Rosuvastatin]     higher doses cause myalgias   Penicillins     Family History: Family History  Problem Relation Age of Onset   Asthma Mother    Hypertension Father    CAD Father    Hyperlipidemia Father     Social History:  reports that he has never smoked. He has never used smokeless tobacco. He reports that he does not drink alcohol and does not use drugs.  ROS: All other review of systems were reviewed and are negative except what is noted above in HPI  Physical Exam: BP 123/68   Pulse (!) 51   Constitutional:  Alert and oriented, No acute distress. HEENT: [place] AT, moist mucus membranes.  Trachea midline, no masses. Cardiovascular: No clubbing, cyanosis, or edema. Respiratory: Normal respiratory effort, no increased work of breathing. GI: Abdomen is soft, nontender, nondistended, no abdominal masses GU: No CVA tenderness.  Lymph: No cervical or inguinal lymphadenopathy. Skin: No rashes, bruises or suspicious lesions. Neurologic: Grossly intact, no focal deficits, moving all 4 extremities. Psychiatric: Normal mood and affect.  Laboratory Data: Lab Results  Component Value Date   WBC 4.5 06/30/2022   HGB 15.1 06/30/2022   HCT 45.1 06/30/2022   MCV 86 06/30/2022   PLT 254 06/30/2022    Lab Results  Component Value Date   CREATININE 1.24 04/24/2023    No results found for: "PSA"  No results found for: "TESTOSTERONE"  Lab Results  Component Value Date   HGBA1C 5.9 (H) 04/24/2023    Urinalysis    Component Value Date/Time   APPEARANCEUR Clear 06/16/2022 0930   GLUCOSEU Negative 06/16/2022 0930   BILIRUBINUR Negative 06/16/2022 0930   PROTEINUR Negative 06/16/2022 0930   NITRITE Negative 06/16/2022 0930   LEUKOCYTESUR Negative 06/16/2022 0930    Lab Results  Component Value Date   LABMICR See below: 06/16/2022    WBCUA None seen 06/16/2022   LABEPIT 0-10 06/16/2022   BACTERIA None seen 06/16/2022    Pertinent Imaging: *** No results found for this or any previous visit.  No results found for this or any previous visit.  No results found for this or any previous visit.  No results found for this or any previous visit.  No results found for this or any previous visit.  No valid procedures specified. No results found for this or any previous visit.  No results found for this or any previous visit.   Assessment & Plan:    1. Benign prostatic hyperplasia, unspecified whether lower urinary tract symptoms present We discussed the management of his BPH including continued medical therapy, Rezum, Urolift, TURP and simple prostatectomy. After discussing the options the patient has elected to proceed with observation. Risks/benefits/alternatives discussed.  -  Urinalysis, Routine w reflex microscopic  2. Incomplete emptying of bladder -patient defers therapy at this time.   3. Erectile dysfunction, unspecified erectile dysfunction type -tadalafil 5mg  prn   No follow-ups on file.  Wilkie Aye, MD  [facility] [place]

## 2023-06-20 NOTE — Patient Instructions (Signed)

## 2023-06-21 ENCOUNTER — Other Ambulatory Visit: Payer: Self-pay | Admitting: Medical Genetics

## 2023-06-21 DIAGNOSIS — Z006 Encounter for examination for normal comparison and control in clinical research program: Secondary | ICD-10-CM

## 2023-06-26 ENCOUNTER — Other Ambulatory Visit (HOSPITAL_COMMUNITY): Payer: 59 | Attending: Medical Genetics

## 2023-06-30 ENCOUNTER — Other Ambulatory Visit: Payer: Self-pay

## 2023-06-30 ENCOUNTER — Emergency Department (HOSPITAL_COMMUNITY): Payer: 59

## 2023-06-30 ENCOUNTER — Emergency Department (HOSPITAL_COMMUNITY)
Admission: EM | Admit: 2023-06-30 | Discharge: 2023-06-30 | Disposition: A | Discharge status: Home / Self Care | Payer: 59 | Attending: Emergency Medicine | Admitting: Emergency Medicine

## 2023-06-30 DIAGNOSIS — Z7982 Long term (current) use of aspirin: Secondary | ICD-10-CM | POA: Insufficient documentation

## 2023-06-30 DIAGNOSIS — R7989 Other specified abnormal findings of blood chemistry: Secondary | ICD-10-CM | POA: Diagnosis not present

## 2023-06-30 DIAGNOSIS — Z951 Presence of aortocoronary bypass graft: Secondary | ICD-10-CM | POA: Diagnosis not present

## 2023-06-30 DIAGNOSIS — R079 Chest pain, unspecified: Secondary | ICD-10-CM | POA: Insufficient documentation

## 2023-06-30 DIAGNOSIS — R001 Bradycardia, unspecified: Secondary | ICD-10-CM | POA: Diagnosis not present

## 2023-06-30 DIAGNOSIS — I2510 Atherosclerotic heart disease of native coronary artery without angina pectoris: Secondary | ICD-10-CM | POA: Diagnosis not present

## 2023-06-30 DIAGNOSIS — R0789 Other chest pain: Secondary | ICD-10-CM | POA: Diagnosis not present

## 2023-06-30 LAB — TROPONIN I (HIGH SENSITIVITY)
Troponin I (High Sensitivity): 6 ng/L (ref <18)
Troponin I (High Sensitivity): 6 ng/L (ref <18)

## 2023-06-30 LAB — CBC
HCT: 42.9 % (ref 39.0–52.0)
Hemoglobin: 14.2 g/dL (ref 13.0–17.0)
MCH: 29.1 pg (ref 26.0–34.0)
MCHC: 33.1 g/dL (ref 30.0–36.0)
MCV: 87.9 fL (ref 80.0–100.0)
Platelets: 239 10*3/uL (ref 150–400)
RBC: 4.88 MIL/uL (ref 4.22–5.81)
RDW: 13 % (ref 11.5–15.5)
WBC: 5.6 10*3/uL (ref 4.0–10.5)
nRBC: 0 % (ref 0.0–0.2)

## 2023-06-30 LAB — BASIC METABOLIC PANEL
Anion gap: 10 (ref 5–15)
BUN: 15 mg/dL (ref 6–20)
CO2: 24 mmol/L (ref 22–32)
Calcium: 9 mg/dL (ref 8.9–10.3)
Chloride: 105 mmol/L (ref 98–111)
Creatinine, Ser: 1.28 mg/dL — ABNORMAL HIGH (ref 0.61–1.24)
GFR, Estimated: >60 mL/min (ref >60)
Glucose, Bld: 116 mg/dL — ABNORMAL HIGH (ref 70–99)
Potassium: 4.1 mmol/L (ref 3.5–5.1)
Sodium: 139 mmol/L (ref 135–145)

## 2023-06-30 MED ORDER — ONDANSETRON 4 MG PO TBDP
4.0000 mg | ORAL_TABLET | Freq: Once | ORAL | Status: AC
  Administered 2023-06-30: 4 mg via ORAL
  Filled 2023-06-30: qty 1

## 2023-06-30 MED ORDER — NITROGLYCERIN 0.4 MG SL SUBL
0.4000 mg | SUBLINGUAL_TABLET | SUBLINGUAL | Status: DC | PRN
  Administered 2023-06-30: 0.4 mg via SUBLINGUAL
  Filled 2023-06-30: qty 1

## 2023-06-30 NOTE — ED Triage Notes (Signed)
Pt c/o left sided chest pain radiating into the left arm that started at 2 am while sleeping. CP associated with SOB. Pt took 324 ASA, NTG 2, and Pepcid. Sts NTG worsened CP.  Hx CBAG 2021.

## 2023-06-30 NOTE — ED Provider Notes (Signed)
[place] EMERGENCY DEPARTMENT AT [facility] Provider Note   CSN: [id number] Arrival date & time: 06/30/23  5621     History  Chief Complaint  Patient presents with   Chest Pain    Michael Franklin is a 47 y.o. male.  The history is provided by the patient and medical records.  Chest Pain Michael Franklin is a 47 y.o. male who presents to the Emergency Department complaining of chest pain.  He presents to the emergency department for evaluation of left-sided chest pain that woke him from sleep at 2 AM.  Pain is described as squeezing in nature, nonradiating.  He did take 324 of aspirin prior to ED arrival as well as 2 nitroglycerin.  He did have slight worsening in his pain after nitroglycerin at home.  He did also take Pepcid.  He has a history of coronary artery disease status post CABG in 2021.  He also has a history of hyperlipidemia and is on Repatha. No prior similar symptoms.  No difficulty breathing, abdominal pain, fevers, nausea, vomiting.  No recent illnesses or change in activity tolerance.      Home Medications Prior to Admission medications   Medication Sig Start Date End Date Taking? Authorizing Provider  ALPRAZolam Prudy Feeler) 1 MG tablet Take 1 tablet (1 mg total) by mouth daily. 10/24/22     ALPRAZolam (XANAX) 1 MG tablet Take 1 tablet (1 mg total) by mouth daily. 01/29/23   Iran Ouch, MD  ALPRAZolam Prudy Feeler) 1 MG tablet Take 1 tablet (1 mg total) by mouth nightly as needed for anxiety/sleep 05/07/23     amLODipine (NORVASC) 5 MG tablet Take 1 tablet (5 mg total) by mouth daily. 05/29/23   O'NealRonnald Ramp, MD  aspirin EC 81 MG tablet Take 1 tablet (81 mg total) by mouth daily. Swallow whole. 11/07/21   Orbie Pyo, MD  Blood Pressure Monitoring (OMRON 3 SERIES BP MONITOR) DEVI Use as directed 05/06/21   Andrez Grime, RPH  Evolocumab (REPATHA SURECLICK) 140 MG/ML SOAJ Inject 140 mg into the skin every 14 days. 10/02/22   Hilty, Lisette Abu, MD   levocetirizine (XYZAL) 5 MG tablet Take 1 tablet (5 mg total) by mouth every evening. Patient not taking: Reported on 06/20/2023 04/25/22   Iran Ouch, MD  nitroGLYCERIN (NITROSTAT) 0.4 MG SL tablet Place 1 tablet under the tongue every 5 minutes as needed for chest pain.  If no relief after 1st dose, call 911. 04/24/23   Iran Ouch, MD  rosuvastatin (CRESTOR) 5 MG tablet Take 1 tablet (5 mg total) by mouth daily. 08/14/22   Iran Ouch, MD  sildenafil (VIAGRA) 100 MG tablet Take 1 tablet (100 mg total) by mouth daily as needed for erectile dysfunction. 06/16/22   McKenzie, Mardene Celeste, MD  tadalafil (CIALIS) 5 MG tablet Take 1 tablet (5 mg total) by mouth daily. 04/26/22   McKenzie, Mardene Celeste, MD  zolpidem (AMBIEN) 5 MG tablet Take 1 tablet by mouth at bedtime as needed for insomnia. Patient not taking: Reported on 06/20/2023 04/25/22   Iran Ouch, MD  zolpidem (AMBIEN) 5 MG tablet Take 1 tablet (5 mg total) by mouth at bedtime as needed. 04/25/22   Iran Ouch, MD  sucralfate (CARAFATE) 1 GM/10ML suspension Take 10 mLs (1 g total) by mouth 4 (four) times daily -  with meals and at bedtime. 09/08/20 10/25/20  Pollyann Savoy, MD      Allergies  Atorvastatin, Crestor [rosuvastatin], and Penicillins    Review of Systems   Review of Systems  Cardiovascular:  Positive for chest pain.  All other systems reviewed and are negative.   Physical Exam Updated Vital Signs BP 109/66   Pulse (!) 43   Temp 97.6 F (36.4 C)   Resp 16   Ht 5\' 10"  (1.778 m)   Wt 78.9 kg   SpO2 99%   BMI 24.97 kg/m  Physical Exam Vitals and nursing note reviewed.  Constitutional:      Appearance: He is well-developed.  HENT:     Head: Normocephalic and atraumatic.  Cardiovascular:     Rate and Rhythm: Regular rhythm. Bradycardia present.     Heart sounds: No murmur heard. Pulmonary:     Effort: Pulmonary effort is normal. No respiratory distress.     Breath sounds: Normal  breath sounds.  Abdominal:     Palpations: Abdomen is soft.     Tenderness: There is no abdominal tenderness. There is no guarding or rebound.  Musculoskeletal:        General: No swelling or tenderness.  Skin:    General: Skin is warm and dry.  Neurological:     Mental Status: He is alert and oriented to person, place, and time.  Psychiatric:        Behavior: Behavior normal.     ED Results / Procedures / Treatments   Labs (all labs ordered are listed, but only abnormal results are displayed) Labs Reviewed  BASIC METABOLIC PANEL - Abnormal; Notable for the following components:      Result Value   Glucose, Bld 116 (*)    Creatinine, Ser 1.28 (*)    All other components within normal limits  CBC  TROPONIN I (HIGH SENSITIVITY)  TROPONIN I (HIGH SENSITIVITY)    EKG EKG Interpretation Date/Time:  Saturday June 30 2023 04:06:18 EDT Ventricular Rate:  47 PR Interval:  141 QRS Duration:  89 QT Interval:  486 QTC Calculation: 430 R Axis:   105  Text Interpretation: Sinus bradycardia Biatrial enlargement Anteroseptal infarct, age indeterminate Confirmed by Tilden Fossa [id number]) on 06/30/2023 4:15:01 AM  Radiology DG Chest 2 View  Result Date: 06/30/2023 CLINICAL DATA:  Chest pain EXAM: CHEST - 2 VIEW COMPARISON:  08/20/2022 FINDINGS: Lungs are well expanded, symmetric, and clear. No pneumothorax or pleural effusion. Cardiac size within normal limits. Status post coronary artery bypass grafting. Pulmonary vascularity is normal. Osseous structures are age-appropriate. No acute bone abnormality. IMPRESSION: 1. No active cardiopulmonary disease. Electronically Signed   By: Helyn Numbers M.D.   On: 06/30/2023 03:44    Procedures Procedures    Medications Ordered in ED Medications  nitroGLYCERIN (NITROSTAT) SL tablet 0.4 mg (0.4 mg Sublingual Given 06/30/23 0422)  ondansetron (ZOFRAN-ODT) disintegrating tablet 4 mg (4 mg Oral Given 06/30/23 0423)    ED Course/ Medical  Decision Making/ A&P                                 Medical Decision Making Amount and/or Complexity of Data Reviewed Labs: ordered. Radiology: ordered.  Risk Prescription drug management.   Patient with history of coronary artery disease here for evaluation of chest pain.  Initial EKG mild flattening in lead III, V4 through V6.  Repeat EKG with pain is without any abnormalities.  Troponins are negative x 2.  Chest x-ray is negative for acute process.  No significant change after  nitroglycerin administration.  CBC without acute abnormalities.  BMP with mild elevation in his creatinine, likely secondary to fitness.  Troponins are negative x 2.  Presentation is not consistent with PE, ACS, dissection.  Feel he is stable for discharge home with cardiology follow-up and return precautions.        Final Clinical Impression(s) / ED Diagnoses Final diagnoses:  Nonspecific chest pain    Rx / DC Orders ED Discharge Orders     None         Tilden Fossa, MD 06/30/23 [phone]

## 2023-07-02 ENCOUNTER — Other Ambulatory Visit: Payer: Self-pay | Admitting: Urology

## 2023-07-02 DIAGNOSIS — R339 Retention of urine, unspecified: Secondary | ICD-10-CM

## 2023-07-02 DIAGNOSIS — N400 Enlarged prostate without lower urinary tract symptoms: Secondary | ICD-10-CM

## 2023-07-02 DIAGNOSIS — N529 Male erectile dysfunction, unspecified: Secondary | ICD-10-CM

## 2023-07-03 ENCOUNTER — Other Ambulatory Visit (HOSPITAL_COMMUNITY): Payer: Self-pay

## 2023-07-03 MED ORDER — TADALAFIL 5 MG PO TABS
5.0000 mg | ORAL_TABLET | Freq: Every day | ORAL | 11 refills | Status: DC
  Filled 2023-07-03: qty 30, 30d supply, fill #0
  Filled 2023-10-05: qty 30, 30d supply, fill #1
  Filled 2024-01-11: qty 30, 30d supply, fill #2
  Filled 2024-04-10: qty 30, 30d supply, fill #3
  Filled 2024-05-13 – 2024-06-28 (×2): qty 30, 30d supply, fill #4

## 2023-07-12 ENCOUNTER — Other Ambulatory Visit (HOSPITAL_COMMUNITY): Payer: Self-pay

## 2023-08-13 ENCOUNTER — Other Ambulatory Visit (HOSPITAL_COMMUNITY): Payer: Self-pay

## 2023-10-03 ENCOUNTER — Other Ambulatory Visit: Payer: Self-pay

## 2023-10-03 ENCOUNTER — Other Ambulatory Visit (HOSPITAL_COMMUNITY): Payer: Self-pay

## 2023-10-03 ENCOUNTER — Other Ambulatory Visit: Payer: Self-pay | Admitting: Cardiovascular Disease

## 2023-10-03 ENCOUNTER — Other Ambulatory Visit: Payer: Self-pay | Admitting: Internal Medicine

## 2023-10-03 DIAGNOSIS — E785 Hyperlipidemia, unspecified: Secondary | ICD-10-CM

## 2023-10-03 DIAGNOSIS — I25708 Atherosclerosis of coronary artery bypass graft(s), unspecified, with other forms of angina pectoris: Secondary | ICD-10-CM

## 2023-10-03 MED ORDER — REPATHA SURECLICK 140 MG/ML ~~LOC~~ SOAJ
140.0000 mg | SUBCUTANEOUS | 3 refills | Status: AC
  Filled 2023-10-03: qty 6, 84d supply, fill #0
  Filled 2024-01-11: qty 6, 84d supply, fill #1
  Filled 2024-04-10: qty 6, 84d supply, fill #2
  Filled 2024-07-13: qty 6, 84d supply, fill #3

## 2023-10-03 MED ORDER — ZOLPIDEM TARTRATE 5 MG PO TABS: 5.0000 mg | ORAL_TABLET | Freq: Every evening | ORAL | 4 refills | Status: AC | PRN

## 2023-10-05 ENCOUNTER — Other Ambulatory Visit: Payer: Self-pay | Admitting: Cardiovascular Disease

## 2023-10-05 ENCOUNTER — Other Ambulatory Visit (HOSPITAL_COMMUNITY): Payer: Self-pay

## 2023-10-05 MED ORDER — ROSUVASTATIN CALCIUM 5 MG PO TABS
5.0000 mg | ORAL_TABLET | Freq: Every day | ORAL | 2 refills | Status: AC
  Filled 2023-10-05: qty 90, 90d supply, fill #0
  Filled 2024-01-11: qty 90, 90d supply, fill #1
  Filled 2024-07-13: qty 90, 90d supply, fill #2

## 2023-10-06 ENCOUNTER — Other Ambulatory Visit: Payer: Self-pay | Admitting: Urology

## 2023-10-06 MED ORDER — ALFUZOSIN HCL ER 10 MG PO TB24
10.0000 mg | ORAL_TABLET | Freq: Every day | ORAL | 11 refills | Status: AC
  Filled 2023-10-06: qty 30, 30d supply, fill #0
  Filled 2024-01-11: qty 30, 30d supply, fill #1
  Filled 2024-08-13 (×2): qty 30, 30d supply, fill #2

## 2023-10-07 ENCOUNTER — Other Ambulatory Visit: Payer: Self-pay

## 2023-10-08 ENCOUNTER — Other Ambulatory Visit: Payer: Self-pay

## 2023-10-16 ENCOUNTER — Ambulatory Visit: Payer: 59 | Attending: Cardiovascular Disease | Admitting: Cardiovascular Disease

## 2023-10-16 ENCOUNTER — Encounter: Payer: Self-pay | Admitting: Cardiovascular Disease

## 2023-10-16 VITALS — BP 147/77 | HR 57 | Ht 70.0 in | Wt 178.4 lb

## 2023-10-16 DIAGNOSIS — I25708 Atherosclerosis of coronary artery bypass graft(s), unspecified, with other forms of angina pectoris: Secondary | ICD-10-CM

## 2023-10-16 DIAGNOSIS — I493 Ventricular premature depolarization: Secondary | ICD-10-CM

## 2023-10-16 DIAGNOSIS — E785 Hyperlipidemia, unspecified: Secondary | ICD-10-CM

## 2023-10-16 DIAGNOSIS — I10 Essential (primary) hypertension: Secondary | ICD-10-CM

## 2023-10-16 NOTE — Patient Instructions (Signed)
Medication Instructions:  No change *If you need a refill on your cardiac medications before your next appointment, please call your pharmacy*   Lab Work: None ordered If you have labs (blood work) drawn today and your tests are completely normal, you will receive your results only by: MyChart Message (if you have MyChart) OR A paper copy in the mail If you have any lab test that is abnormal or we need to change your treatment, we will call you to review the results.   Testing/Procedures: None ordered   Follow-Up: At [facility], you and your health needs are our priority.  As part of our continuing mission to provide you with exceptional heart care, we have created designated Provider Care Teams.  These Care Teams include your primary Cardiologist (physician) and Advanced Practice Providers (APPs -  Physician Assistants and Nurse Practitioners) who all work together to provide you with the care you need, when you need it.  We recommend signing up for the patient portal called "MyChart".  Sign up information is provided on this After Visit Summary.  MyChart is used to connect with patients for Virtual Visits (Telemedicine).  Patients are able to view lab/test results, encounter notes, upcoming appointments, etc.  Non-urgent messages can be sent to your provider as well.   To learn more about what you can do with MyChart, go to [email or web address].    Your next appointment:   6 month(s)  Provider:   Lorine Bears, MD

## 2023-10-16 NOTE — Progress Notes (Signed)
Cardiology Office Note   Date:  10/16/2023   ID:  Michael Franklin, DOB July 29, 1976, MRN [id number]  PCP:  Iran Ouch, MD  Cardiologist:   Lorine Bears, MD   No chief complaint on file.      History of Present Illness: Michael Franklin is a 48 y.o. male who presents for a follow-up visit regarding coronary artery disease status post CABG. He is one of our hospitalists.  He has known history of hyperlipidemia and family history of coronary artery disease.  His father had CABG in his 42s.  The patient has been very healthy throughout his life with excellent lifestyle and he plays soccer on a regular basis.  He did not tolerate atorvastatin in the past due to myalgia.  He presented in Oct 2021 with non-ST elevation myocardial infarction. EKG was unremarkable with the exception of possible septal Q waves.Echocardiogram showed an EF of 50 to 55% with mild mitral regurgitation.  Cardiac catheterization surprisingly showed severe three-vessel coronary artery disease with an occluded ostial LAD which was significantly calcified with collaterals noted from the right coronary artery, 95% stenosis in large ostial ramus and severe right PDA stenosis.  LVEDP was 24.  The patient underwent CABG by Dr. Cliffton Asters.  The patient was referred to Dr. Rennis Golden to evaluate for premature atherosclerosis and was found to have significantly elevated LP(a) elevation.  The patient has been tolerating small dose Crestor and was started on Repatha.  He had symptomatic PVCs that initially responded to treatment with Toprol but he developed bradycardia with fatigue and thus, Toprol was discontinued.  He had recurrent chest pain since CABG with some intense episodes partially responded to nitroglycerin.  Repeat cardiac catheterization was done in March of 2022 which showed significant underlying three-vessel coronary artery disease with patent LIMA to LAD, patent radial to ramus and patent SVG to right PDA.  There was  moderate disease in the proximal portion of SVG to right PDA.  However, the right PDA itself was relatively supplying a small territory and the native RCA was not significantly diseased other than the right PDA.  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   He improved with colchicine.  He had worsening angina last year in the setting of increased anxiety and stress at work.  His symptoms improved.  He did have an emergency room visit in November for chest pain but his troponin was normal and EKG showed no acute changes.  Past Medical History:  Diagnosis Date   Hyperlipidemia    NSTEMI (non-ST elevated myocardial infarction) [facility])     Past Surgical History:  Procedure Laterality Date   CORONARY ARTERY BYPASS GRAFT N/A 06/15/2020   Procedure: CORONARY ARTERY BYPASS GRAFTING (CABG) TIMES THREE USING HARVESTED LEFT RADIAL ARTERY, LEFT INTERNAL MAMMARY ARTERY,RIGHT GREATER SAPHENOUS VEIN,HARVESTED ENDOSCOPICALLY. Removal of balloon pump.;  Surgeon: Corliss Skains, MD;  Location: [facility] OR;  Service: Open Heart Surgery;  Laterality: N/A;   CORONARY/GRAFT ACUTE MI REVASCULARIZATION N/A 06/14/2020   Procedure: Coronary/Graft Acute MI Revascularization;  Surgeon: Iran Ouch, MD;  Location: [facility] INVASIVE CV LAB;  Service: Cardiovascular;  Laterality: N/A;   IABP INSERTION N/A 06/15/2020   Procedure: IABP Insertion;  Surgeon: Iran Ouch, MD;  Location: [facility] INVASIVE CV LAB;  Service: Cardiovascular;  Laterality: N/A;   LEFT HEART CATH AND CORONARY ANGIOGRAPHY N/A 06/14/2020   Procedure: LEFT HEART CATH AND CORONARY ANGIOGRAPHY;  Surgeon: Iran Ouch, MD;  Location: [facility]  INVASIVE CV LAB;  Service: Cardiovascular;  Laterality: N/A;   LEFT HEART CATH AND CORS/GRAFTS ANGIOGRAPHY N/A 11/05/2020   Procedure: LEFT HEART CATH AND CORS/GRAFTS ANGIOGRAPHY;  Surgeon: Iran Ouch, MD;  Location: [facility] INVASIVE CV LAB;  Service: Cardiovascular;  Laterality: N/A;    TEE WITHOUT CARDIOVERSION N/A 06/15/2020   Procedure: TRANSESOPHAGEAL ECHOCARDIOGRAM (TEE);  Surgeon: Corliss Skains, MD;  Location[facility];  Service: Open Heart Surgery;  Laterality: N/A;     Current Outpatient Medications  Medication Sig Dispense Refill   alfuzosin (UROXATRAL) 10 MG 24 hr tablet Take 1 tablet (10 mg total) by mouth at bedtime. 30 tablet 11   ALPRAZolam (XANAX) 1 MG tablet Take 1 tablet (1 mg total) by mouth nightly as needed for anxiety/sleep 30 tablet 5   amLODipine (NORVASC) 5 MG tablet Take 1 tablet (5 mg total) by mouth daily. 90 tablet 3   aspirin EC 81 MG tablet Take 1 tablet (81 mg total) by mouth daily. Swallow whole. 90 tablet 3   Blood Pressure Monitoring (OMRON 3 SERIES BP MONITOR) DEVI Use as directed 1 each 0   Evolocumab (REPATHA SURECLICK) 140 MG/ML SOAJ Inject 140 mg into the skin every 14 days. 6 mL 3   nitroGLYCERIN (NITROSTAT) 0.4 MG SL tablet Place 1 tablet under the tongue every 5 minutes as needed for chest pain.  If no relief after 1st dose, call 911. 25 tablet 6   rosuvastatin (CRESTOR) 5 MG tablet Take 1 tablet (5 mg total) by mouth daily. 90 tablet 2   tadalafil (CIALIS) 5 MG tablet Take 1 tablet (5 mg total) by mouth daily. 30 tablet 11   zolpidem (AMBIEN) 5 MG tablet Take 1 tablet by mouth at bedtime as needed for insomnia. 30 tablet 4   ALPRAZolam (XANAX) 1 MG tablet Take 1 tablet (1 mg total) by mouth daily. 15 tablet 0   ALPRAZolam (XANAX) 1 MG tablet Take 1 tablet (1 mg total) by mouth daily. 15 tablet 0   levocetirizine (XYZAL) 5 MG tablet Take 1 tablet (5 mg total) by mouth every evening. (Patient not taking: Reported on 10/16/2023) 90 tablet 3   sildenafil (VIAGRA) 100 MG tablet Take 1 tablet (100 mg total) by mouth daily as needed for erectile dysfunction. (Patient not taking: Reported on 10/16/2023) 90 tablet 3   zolpidem (AMBIEN) 5 MG tablet Take 1 tablet (5 mg total) by mouth at bedtime as needed. 30 tablet 4   No current  facility-administered medications for this visit.    Allergies:   Atorvastatin, Crestor [rosuvastatin], and Penicillins    Social History:  The patient  reports that he has never smoked. He has never used smokeless tobacco. He reports that he does not drink alcohol and does not use drugs.   Family History:  The patient's family history includes Asthma in his mother; CAD in his father; Hyperlipidemia in his father; Hypertension in his father.    ROS:  Please see the history of present illness.   Otherwise, review of systems are positive for none.   All other systems are reviewed and negative.    PHYSICAL EXAM: VS:  BP (!) 147/77 (BP Location: Left Arm, Patient Position: Sitting)   Pulse (!) 57   Ht 5\' 10"  (1.778 m)   Wt 178 lb 6.4 oz (80.9 kg)   SpO2 98%   BMI 25.60 kg/m  , BMI Body mass index is 25.6 kg/m. GEN: Well nourished, well developed, in no acute distress  HEENT:  normal  Neck: no JVD, carotid bruits, or masses Cardiac: RRR; no murmurs, rubs, or gallops,no edema  Respiratory:  clear to auscultation bilaterally, normal work of breathing GI: soft, nontender, nondistended, + BS MS: no deformity or atrophy  Skin: warm and dry, no rash Neuro:  Strength and sensation are intact Psych: euthymic mood, full affect   EKG was performed. EKG showed: Sinus bradycardia Possible Left atrial enlargement Rightward axis    Recent Labs: 04/24/2023: ALT 14 06/30/2023: BUN 15; Creatinine, Ser 1.28; Hemoglobin 14.2; Platelets 239; Potassium 4.1; Sodium 139    Lipid Panel    Component Value Date/Time   CHOL 117 04/24/2023 0839   TRIG 173 (H) 04/24/2023 0839   HDL 46 04/24/2023 0839   CHOLHDL 2.5 04/24/2023 0839   CHOLHDL 4.5 06/14/2020 1729   VLDL 13 06/14/2020 1729   LDLCALC 43 04/24/2023 0839      Wt Readings from Last 3 Encounters:  10/16/23 178 lb 6.4 oz (80.9 kg)  06/30/23 174 lb (78.9 kg)  04/24/23 173 lb 6.4 oz (78.7 kg)           No data to display             ASSESSMENT AND PLAN:  1.  Coronary artery disease involving native coronary arteries with other forms of angina: Intermittent episodes of chest pain at rest that responds to nitroglycerin.  Possible coronary spasm.  His symptoms improved compared to before and currently with no exertional chest pain or shortness of breath.  Continue medical therapy.    2.  Hyperlipidemia: Continue treatment with small dose rosuvastatin and Repatha.  Most recent lipid profile showed an LDL of 30.  He does not take rosuvastatin all the time due to myalgia. however, he is consistent with Repatha.  His LP(a) improved from 225 to 143.  3.  Essential hypertension: Continue treatment with amlodipine.  I rechecked his blood pressure at the end of the visit and it was 128/82.  4.  PVCs: Resolved.  He is off Toprol.  5.  Anxiety: Slight improvement from before.   Disposition:   FU with me in 6 months  Signed,  Lorine Bears, MD  10/16/2023 11:24 AM    [facility]

## 2023-11-26 ENCOUNTER — Other Ambulatory Visit (HOSPITAL_COMMUNITY): Payer: Self-pay

## 2023-11-26 MED ORDER — CLOTRIMAZOLE-BETAMETHASONE 1-0.05 % EX CREA
TOPICAL_CREAM | CUTANEOUS | 2 refills | Status: AC
  Filled 2023-11-26: qty 45, 10d supply, fill #0

## 2023-12-24 ENCOUNTER — Ambulatory Visit: Payer: 59 | Admitting: Urology

## 2024-01-11 ENCOUNTER — Other Ambulatory Visit: Payer: Self-pay

## 2024-01-14 ENCOUNTER — Other Ambulatory Visit (HOSPITAL_COMMUNITY): Payer: Self-pay

## 2024-02-05 ENCOUNTER — Other Ambulatory Visit: Payer: Self-pay | Admitting: Urology

## 2024-02-05 DIAGNOSIS — N529 Male erectile dysfunction, unspecified: Secondary | ICD-10-CM

## 2024-02-05 DIAGNOSIS — N400 Enlarged prostate without lower urinary tract symptoms: Secondary | ICD-10-CM

## 2024-02-14 ENCOUNTER — Other Ambulatory Visit (HOSPITAL_COMMUNITY): Payer: Self-pay

## 2024-04-10 ENCOUNTER — Encounter (HOSPITAL_COMMUNITY): Payer: Self-pay

## 2024-04-10 ENCOUNTER — Other Ambulatory Visit: Payer: Self-pay | Admitting: Urology

## 2024-04-10 ENCOUNTER — Other Ambulatory Visit (HOSPITAL_COMMUNITY): Payer: Self-pay

## 2024-04-10 DIAGNOSIS — R339 Retention of urine, unspecified: Secondary | ICD-10-CM

## 2024-04-11 ENCOUNTER — Other Ambulatory Visit: Payer: Self-pay

## 2024-04-11 ENCOUNTER — Other Ambulatory Visit (HOSPITAL_COMMUNITY): Payer: Self-pay

## 2024-04-11 MED ORDER — ALPRAZOLAM 1 MG PO TABS
1.0000 mg | ORAL_TABLET | Freq: Every evening | ORAL | 1 refills | Status: AC
  Filled 2024-04-11: qty 30, 30d supply, fill #0
  Filled 2024-05-13: qty 30, 30d supply, fill #1

## 2024-04-14 ENCOUNTER — Other Ambulatory Visit (HOSPITAL_COMMUNITY): Payer: Self-pay

## 2024-04-14 MED ORDER — SILDENAFIL CITRATE 100 MG PO TABS
100.0000 mg | ORAL_TABLET | Freq: Every day | ORAL | 3 refills | Status: AC | PRN
Start: 2024-04-14 — End: ?
  Filled 2024-04-14: qty 90, 90d supply, fill #0

## 2024-04-24 ENCOUNTER — Other Ambulatory Visit (HOSPITAL_COMMUNITY): Payer: Self-pay

## 2024-05-13 ENCOUNTER — Other Ambulatory Visit (HOSPITAL_COMMUNITY): Payer: Self-pay

## 2024-05-22 ENCOUNTER — Telehealth: Payer: Self-pay | Admitting: Cardiovascular Disease

## 2024-05-22 NOTE — Telephone Encounter (Signed)
 Patient would like fast labs to be drawn on his appt for 9/30. Please advise

## 2024-05-23 ENCOUNTER — Other Ambulatory Visit (HOSPITAL_COMMUNITY): Payer: Self-pay

## 2024-05-27 ENCOUNTER — Ambulatory Visit: Attending: Cardiovascular Disease | Admitting: Cardiovascular Disease

## 2024-05-27 VITALS — BP 118/78 | HR 56 | Ht 69.0 in | Wt 178.0 lb

## 2024-05-27 DIAGNOSIS — E785 Hyperlipidemia, unspecified: Secondary | ICD-10-CM | POA: Diagnosis not present

## 2024-05-27 DIAGNOSIS — I493 Ventricular premature depolarization: Secondary | ICD-10-CM | POA: Diagnosis not present

## 2024-05-27 DIAGNOSIS — I2510 Atherosclerotic heart disease of native coronary artery without angina pectoris: Secondary | ICD-10-CM | POA: Diagnosis not present

## 2024-05-27 DIAGNOSIS — I10 Essential (primary) hypertension: Secondary | ICD-10-CM | POA: Diagnosis not present

## 2024-05-27 DIAGNOSIS — I25118 Atherosclerotic heart disease of native coronary artery with other forms of angina pectoris: Secondary | ICD-10-CM | POA: Diagnosis not present

## 2024-05-27 NOTE — Patient Instructions (Signed)
 Medication Instructions:  No changes *If you need a refill on your cardiac medications before your next appointment, please call your pharmacy*  Lab Work: Your provider would like for you to have the following labs today: PSA, CMET, CBC, A1C and Lipid  If you have labs (blood work) drawn today and your tests are completely normal, you will receive your results only by: MyChart Message (if you have MyChart) OR A paper copy in the mail If you have any lab test that is abnormal or we need to change your treatment, we will call you to review the results.  Testing/Procedures: None ordered  Follow-Up: At [facility], you and your health needs are our priority.  As part of our continuing mission to provide you with exceptional heart care, our providers are all part of one team.  This team includes your primary Cardiologist (physician) and Advanced Practice Providers or APPs (Physician Assistants and Nurse Practitioners) who all work together to provide you with the care you need, when you need it.  Your next appointment:   12 month(s)  Provider:   Deatrice Cage, MD    We recommend signing up for the patient portal called MyChart.  Sign up information is provided on this After Visit Summary.  MyChart is used to connect with patients for Virtual Visits (Telemedicine).  Patients are able to view lab/test results, encounter notes, upcoming appointments, etc.  Non-urgent messages can be sent to your provider as well.   To learn more about what you can do with MyChart, go to [email or web address].

## 2024-05-27 NOTE — Progress Notes (Signed)
 Cardiology Office Note   Date:  05/27/2024   ID:  Michael Franklin, DOB 1975/09/28, MRN [id number]  PCP:  Darron Deatrice LABOR, MD  Cardiologist:   Deatrice Darron, MD   No chief complaint on file.      History of Present Illness: Michael Franklin is a 48 y.o. male who presents for a follow-up visit regarding coronary artery disease status post CABG. He is one of our hospitalists.  He has known history of hyperlipidemia and family history of coronary artery disease.  His father had CABG in his 5s.  The patient has been very healthy throughout his life with excellent lifestyle and he plays soccer on a regular basis.  He did not tolerate atorvastatin  in the past due to myalgia.  He presented in Oct 2021 with non-ST elevation myocardial infarction. EKG was unremarkable with the exception of possible septal Q waves.Echocardiogram showed an EF of 50 to 55% with mild mitral regurgitation.  Cardiac catheterization surprisingly showed severe three-vessel coronary artery disease with an occluded ostial LAD which was significantly calcified with collaterals noted from the right coronary artery, 95% stenosis in large ostial ramus and severe right PDA stenosis.  LVEDP was 24.  The patient underwent CABG by Dr. Shyrl.  The patient was referred to Dr. Mona to evaluate for premature atherosclerosis and was found to have significantly elevated LP(a) elevation.  The patient has been tolerating small dose Crestor  and was started on Repatha .  He had symptomatic PVCs that initially responded to treatment with Toprol  but he developed bradycardia with fatigue and thus, Toprol  was discontinued.  He had recurrent chest pain after CABG.  Repeat cardiac catheterization was done in March of 2022 which showed significant underlying three-vessel coronary artery disease with patent LIMA to LAD, patent radial to ramus and patent SVG to right PDA.  There was moderate disease in the proximal portion of SVG to right PDA.   However, the right PDA itself was relatively supplying a small territory and the native RCA was not significantly diseased other than the right PDA.  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   He improved with colchicine .  He has been doing well with no recent chest pain or shortness of breath.  He did have some episodes of palpitations recently but did not last long.  He continues to play soccer 3 times a week.  Past Medical History:  Diagnosis Date   Hyperlipidemia    NSTEMI (non-ST elevated myocardial infarction) [facility])     Past Surgical History:  Procedure Laterality Date   CORONARY ARTERY BYPASS GRAFT N/A 06/15/2020   Procedure: CORONARY ARTERY BYPASS GRAFTING (CABG) TIMES THREE USING HARVESTED LEFT RADIAL ARTERY, LEFT INTERNAL MAMMARY ARTERY,RIGHT GREATER SAPHENOUS VEIN,HARVESTED ENDOSCOPICALLY. Removal of balloon pump.;  Surgeon: Shyrl Linnie KIDD, MD;  Location: [facility] OR;  Service: Open Heart Surgery;  Laterality: N/A;   CORONARY/GRAFT ACUTE MI REVASCULARIZATION N/A 06/14/2020   Procedure: Coronary/Graft Acute MI Revascularization;  Surgeon: Darron Deatrice LABOR, MD;  Location: [facility] INVASIVE CV LAB;  Service: Cardiovascular;  Laterality: N/A;   IABP INSERTION N/A 06/15/2020   Procedure: IABP Insertion;  Surgeon: Darron Deatrice LABOR, MD;  Location: [facility] INVASIVE CV LAB;  Service: Cardiovascular;  Laterality: N/A;   LEFT HEART CATH AND CORONARY ANGIOGRAPHY N/A 06/14/2020   Procedure: LEFT HEART CATH AND CORONARY ANGIOGRAPHY;  Surgeon: Darron Deatrice LABOR, MD;  Location: [facility] INVASIVE CV LAB;  Service: Cardiovascular;  Laterality: N/A;   LEFT HEART CATH  AND CORS/GRAFTS ANGIOGRAPHY N/A 11/05/2020   Procedure: LEFT HEART CATH AND CORS/GRAFTS ANGIOGRAPHY;  Surgeon: Darron Deatrice LABOR, MD;  Location: [facility] INVASIVE CV LAB;  Service: Cardiovascular;  Laterality: N/A;   TEE WITHOUT CARDIOVERSION N/A 06/15/2020   Procedure: TRANSESOPHAGEAL ECHOCARDIOGRAM (TEE);  Surgeon:  Shyrl Linnie KIDD, MD;  Location: [facility] OR;  Service: Open Heart Surgery;  Laterality: N/A;     Current Outpatient Medications  Medication Sig Dispense Refill   alfuzosin  (UROXATRAL ) 10 MG 24 hr tablet Take 1 tablet (10 mg total) by mouth at bedtime. 30 tablet 11   ALPRAZolam  (XANAX ) 1 MG tablet Take 1 tablet (1 mg total) by mouth daily. 15 tablet 0   ALPRAZolam  (XANAX ) 1 MG tablet Take 1 tablet by mouth nightly as needed for anxiety/sleep 30 tablet 1   amLODipine  (NORVASC ) 5 MG tablet Take 1 tablet (5 mg total) by mouth daily. 90 tablet 3   aspirin  EC 81 MG tablet Take 1 tablet (81 mg total) by mouth daily. Swallow whole. 90 tablet 3   Blood Pressure Monitoring (OMRON 3 SERIES BP MONITOR) DEVI Use as directed 1 each 0   clotrimazole -betamethasone  (LOTRISONE ) cream Apply topically as directed 45 g 2   Evolocumab  (REPATHA  SURECLICK) 140 MG/ML SOAJ Inject 140 mg into the skin every 14 days. 6 mL 3   levocetirizine (XYZAL ) 5 MG tablet Take 1 tablet (5 mg total) by mouth every evening. 90 tablet 3   nitroGLYCERIN  (NITROSTAT ) 0.4 MG SL tablet Place 1 tablet under the tongue every 5 minutes as needed for chest pain.  If no relief after 1st dose, call 911. 25 tablet 6   rosuvastatin  (CRESTOR ) 5 MG tablet Take 1 tablet (5 mg total) by mouth daily. 90 tablet 2   tadalafil  (CIALIS ) 5 MG tablet Take 1 tablet (5 mg total) by mouth daily. 30 tablet 11   ALPRAZolam  (XANAX ) 1 MG tablet Take 1 tablet (1 mg total) by mouth daily. 15 tablet 0   ALPRAZolam  (XANAX ) 1 MG tablet Take 1 tablet (1 mg total) by mouth nightly as needed for anxiety/sleep 30 tablet 5   sildenafil  (VIAGRA ) 100 MG tablet Take 1 tablet (100 mg total) by mouth daily as needed for erectile dysfunction. 90 tablet 3   zolpidem  (AMBIEN ) 5 MG tablet Take 1 tablet by mouth at bedtime as needed for insomnia. 30 tablet 4   zolpidem  (AMBIEN ) 5 MG tablet Take 1 tablet (5 mg total) by mouth at bedtime as needed. 30 tablet 4   No current  facility-administered medications for this visit.    Allergies:   Atorvastatin , Crestor  [rosuvastatin ], and Penicillins    Social History:  The patient  reports that he has never smoked. He has never used smokeless tobacco. He reports that he does not drink alcohol and does not use drugs.   Family History:  The patient's family history includes Asthma in his mother; CAD in his father; Hyperlipidemia in his father; Hypertension in his father.    ROS:  Please see the history of present illness.   Otherwise, review of systems are positive for none.   All other systems are reviewed and negative.    PHYSICAL EXAM: VS:  BP 118/78 (BP Location: Right Arm, Patient Position: Sitting, Cuff Size: Large)   Pulse (!) 56   Ht 5' 9 (1.753 m)   Wt 178 lb (80.7 kg)   SpO2 96%   BMI 26.29 kg/m  , BMI Body mass index is 26.29 kg/m. GEN: Well nourished, well developed,  in no acute distress  HEENT: normal  Neck: no JVD, carotid bruits, or masses Cardiac: RRR; no murmurs, rubs, or gallops,no edema  Respiratory:  clear to auscultation bilaterally, normal work of breathing GI: soft, nontender, nondistended, + BS MS: no deformity or atrophy  Skin: warm and dry, no rash Neuro:  Strength and sensation are intact Psych: euthymic mood, full affect   EKG was performed. EKG showed: Sinus bradycardia Rightward axis When compared with ECG of 16-Oct-2023 10:54, No significant change was found    Recent Labs: 06/30/2023: BUN 15; Creatinine, Ser 1.28; Hemoglobin 14.2; Platelets 239; Potassium 4.1; Sodium 139    Lipid Panel    Component Value Date/Time   CHOL 117 04/24/2023 0839   TRIG 173 (H) 04/24/2023 0839   HDL 46 04/24/2023 0839   CHOLHDL 2.5 04/24/2023 0839   CHOLHDL 4.5 06/14/2020 1729   VLDL 13 06/14/2020 1729   LDLCALC 43 04/24/2023 0839      Wt Readings from Last 3 Encounters:  05/27/24 178 lb (80.7 kg)  10/16/23 178 lb 6.4 oz (80.9 kg)  06/30/23 174 lb (78.9 kg)            No data to display            ASSESSMENT AND PLAN:  1.  Coronary artery disease involving native coronary arteries without angina: He is doing extremely well at this time.  Continue medical therapy.   2.  Hyperlipidemia: Continue treatment with Repatha .  I requested lipid profile today.  He is also on small dose rosuvastatin  but does not take that all the time due to myalgia especially when he plays soccer.  3.  Essential hypertension: Continue treatment with amlodipine .  His blood pressure is well-controlled.  4.  PVCs: Resolved.  He is off Toprol .  5.  Routine health maintenance: Will check routine labs on him today.  I also advised him to get a colonoscopy done.   Disposition:   FU with me in 12 months  Signed,  Deatrice Cage, MD  05/27/2024 8:10 AM    [facility]

## 2024-05-28 LAB — COMPREHENSIVE METABOLIC PANEL WITH GFR
ALT: 20 IU/L (ref 0–44)
AST: 33 IU/L (ref 0–40)
Albumin: 4.4 g/dL (ref 4.1–5.1)
Alkaline Phosphatase: 67 IU/L (ref 47–123)
BUN/Creatinine Ratio: 14 (ref 9–20)
BUN: 18 mg/dL (ref 6–24)
Bilirubin Total: 0.4 mg/dL (ref 0.0–1.2)
CO2: 22 mmol/L (ref 20–29)
Calcium: 9.7 mg/dL (ref 8.7–10.2)
Chloride: 106 mmol/L (ref 96–106)
Creatinine, Ser: 1.28 mg/dL — ABNORMAL HIGH (ref 0.76–1.27)
Globulin, Total: 2.5 g/dL (ref 1.5–4.5)
Glucose: 98 mg/dL (ref 70–99)
Potassium: 5.5 mmol/L — ABNORMAL HIGH (ref 3.5–5.2)
Sodium: 144 mmol/L (ref 134–144)
Total Protein: 6.9 g/dL (ref 6.0–8.5)
eGFR: 68 mL/min/1.73 (ref >59)

## 2024-05-28 LAB — CBC
Hematocrit: 46.7 % (ref 37.5–51.0)
Hemoglobin: 15.8 g/dL (ref 13.0–17.7)
MCH: 30.2 pg (ref 26.6–33.0)
MCHC: 33.8 g/dL (ref 31.5–35.7)
MCV: 89 fL (ref 79–97)
Platelets: 250 x10E3/uL (ref 150–450)
RBC: 5.24 x10E6/uL (ref 4.14–5.80)
RDW: 12.6 % (ref 11.6–15.4)
WBC: 5.1 x10E3/uL (ref 3.4–10.8)

## 2024-05-28 LAB — LIPID PANEL
Chol/HDL Ratio: 2.5 ratio (ref 0.0–5.0)
Cholesterol, Total: 133 mg/dL (ref 100–199)
HDL: 53 mg/dL (ref >39)
LDL Chol Calc (NIH): 62 mg/dL (ref 0–99)
Triglycerides: 95 mg/dL (ref 0–149)
VLDL Cholesterol Cal: 18 mg/dL (ref 5–40)

## 2024-05-28 LAB — HEMOGLOBIN A1C
Est. average glucose Bld gHb Est-mCnc: 117 mg/dL
Hgb A1c MFr Bld: 5.7 % — ABNORMAL HIGH (ref 4.8–5.6)

## 2024-05-28 LAB — PSA: Prostate Specific Ag, Serum: 0.5 ng/mL (ref 0.0–4.0)

## 2024-06-04 ENCOUNTER — Ambulatory Visit: Payer: Self-pay | Admitting: Cardiovascular Disease

## 2024-06-23 ENCOUNTER — Other Ambulatory Visit (HOSPITAL_COMMUNITY): Payer: Self-pay

## 2024-06-23 DIAGNOSIS — F5101 Primary insomnia: Secondary | ICD-10-CM | POA: Diagnosis not present

## 2024-06-23 DIAGNOSIS — F4323 Adjustment disorder with mixed anxiety and depressed mood: Secondary | ICD-10-CM | POA: Diagnosis not present

## 2024-06-23 MED ORDER — ALPRAZOLAM 1 MG PO TABS
1.0000 mg | ORAL_TABLET | Freq: Every evening | ORAL | 5 refills | Status: AC | PRN
  Filled 2024-06-23: qty 30, 30d supply, fill #0

## 2024-06-28 ENCOUNTER — Other Ambulatory Visit (HOSPITAL_COMMUNITY): Payer: Self-pay

## 2024-07-13 ENCOUNTER — Other Ambulatory Visit: Payer: Self-pay | Admitting: Urology

## 2024-07-13 ENCOUNTER — Other Ambulatory Visit: Payer: Self-pay | Admitting: Cardiovascular Disease

## 2024-07-13 DIAGNOSIS — R339 Retention of urine, unspecified: Secondary | ICD-10-CM

## 2024-07-13 DIAGNOSIS — N400 Enlarged prostate without lower urinary tract symptoms: Secondary | ICD-10-CM

## 2024-07-13 DIAGNOSIS — E785 Hyperlipidemia, unspecified: Secondary | ICD-10-CM

## 2024-07-13 DIAGNOSIS — N529 Male erectile dysfunction, unspecified: Secondary | ICD-10-CM

## 2024-07-14 ENCOUNTER — Other Ambulatory Visit (HOSPITAL_COMMUNITY): Payer: Self-pay

## 2024-07-14 ENCOUNTER — Other Ambulatory Visit: Payer: Self-pay

## 2024-07-15 ENCOUNTER — Other Ambulatory Visit (HOSPITAL_COMMUNITY): Payer: Self-pay

## 2024-07-15 MED ORDER — NITROGLYCERIN 0.4 MG SL SUBL
0.4000 mg | SUBLINGUAL_TABLET | SUBLINGUAL | 11 refills | Status: AC | PRN
Start: 2024-07-15 — End: ?
  Filled 2024-07-15: qty 25, 1d supply, fill #0

## 2024-07-15 MED ORDER — AMLODIPINE BESYLATE 5 MG PO TABS
5.0000 mg | ORAL_TABLET | Freq: Every day | ORAL | 3 refills | Status: AC
  Filled 2024-07-15: qty 90, 90d supply, fill #0

## 2024-07-16 ENCOUNTER — Other Ambulatory Visit: Payer: Self-pay

## 2024-07-17 ENCOUNTER — Other Ambulatory Visit (HOSPITAL_COMMUNITY): Payer: Self-pay

## 2024-07-17 MED ORDER — TADALAFIL 5 MG PO TABS
5.0000 mg | ORAL_TABLET | Freq: Every day | ORAL | 11 refills | Status: AC
  Filled 2024-07-17 – 2024-08-13 (×2): qty 30, 30d supply, fill #0

## 2024-08-06 ENCOUNTER — Telehealth (HOSPITAL_BASED_OUTPATIENT_CLINIC_OR_DEPARTMENT_OTHER): Payer: Self-pay

## 2024-08-06 NOTE — Telephone Encounter (Signed)
 Left message for patient to call our office and ask for preop team to schedule TELE preop appt.

## 2024-08-06 NOTE — Telephone Encounter (Signed)
° °  Name: Michael Franklin  DOB: 1976/04/25  MRN: [id number]  Primary Cardiologist: Deatrice Cage, MD   Preoperative team, please contact this patient and set up a phone call appointment for further preoperative risk assessment. Please obtain consent and complete medication review. Thank you for your help.  I confirm that guidance regarding antiplatelet and oral anticoagulation therapy has been completed and, if necessary, noted below.  Regarding ASA therapy, we recommend continuation of ASA throughout the perioperative period.  However, if the surgeon feels that cessation of ASA is required in the perioperative period, it may be stopped 5-7 days prior to surgery with a plan to resume it as soon as felt to be feasible from a surgical standpoint in the post-operative period.   I also confirmed the patient resides in the state of [place] . As per [facility] Medical Board telemedicine laws, the patient must reside in the state in which the provider is licensed.   Rollo FABIENE Louder, PA-C 08/06/2024, 4:46 PM [facility]

## 2024-08-06 NOTE — Telephone Encounter (Signed)
° °  Pre-operative Risk Assessment    Patient Name: Michael Franklin  DOB: [birth year] MRN: [id number]   Date of last office visit: 05/27/2024 with Dr. Darron Date of next office visit: None  Request for Surgical Clearance    Procedure:  EGD and colonoscopy  Date of Surgery:  Clearance 08/15/24                                 Surgeon:  Dr. Kristie Socks Group or Practice Name:  [facility]  Phone number:  [phone] Fax number:  [phone]   Type of Clearance Requested:   - Medical  - Pharmacy:  Hold Aspir[facility]  Does not specify   Type of Anesthesia:  Propofol    Additional requests/questions:  None  SignedPatrcia Iverson CROME   08/06/2024, 4:41 PM

## 2024-08-07 ENCOUNTER — Telehealth (HOSPITAL_BASED_OUTPATIENT_CLINIC_OR_DEPARTMENT_OTHER): Payer: Self-pay | Admitting: *Deleted

## 2024-08-07 ENCOUNTER — Ambulatory Visit: Attending: Emergency Medicine | Admitting: Emergency Medicine

## 2024-08-07 DIAGNOSIS — Z01810 Encounter for preprocedural cardiovascular examination: Secondary | ICD-10-CM | POA: Diagnosis not present

## 2024-08-07 NOTE — Telephone Encounter (Signed)
 I s/w DR. Eisinger about tele appt today due to ASA hold if needed. Med rec and consent are done.      Patient Consent for Virtual Visit        Michael Franklin has provided verbal consent on 08/07/2024 for a virtual visit (video or telephone).   CONSENT FOR VIRTUAL VISIT FOR:  Michael Franklin  By participating in this virtual visit I agree to the following:  I hereby voluntarily request, consent and authorize [facility] and its employed or contracted physicians, physician assistants, nurse practitioners or other licensed health care professionals (the Practitioner), to provide me with telemedicine health care services (the Services) as deemed necessary by the treating Practitioner. I acknowledge and consent to receive the Services by the Practitioner via telemedicine. I understand that the telemedicine visit will involve communicating with the Practitioner through live audiovisual communication technology and the disclosure of certain medical information by electronic transmission. I acknowledge that I have been given the opportunity to request an in-person assessment or other available alternative prior to the telemedicine visit and am voluntarily participating in the telemedicine visit.  I understand that I have the right to withhold or withdraw my consent to the use of telemedicine in the course of my care at any time, without affecting my right to future care or treatment, and that the Practitioner or I may terminate the telemedicine visit at any time. I understand that I have the right to inspect all information obtained and/or recorded in the course of the telemedicine visit and may receive copies of available information for a reasonable fee.  I understand that some of the potential risks of receiving the Services via telemedicine include:  Delay or interruption in medical evaluation due to technological equipment failure or disruption; Information transmitted may not be  sufficient (e.g. poor resolution of images) to allow for appropriate medical decision making by the Practitioner; and/or  In rare instances, security protocols could fail, causing a breach of personal health information.  Furthermore, I acknowledge that it is my responsibility to provide information about my medical history, conditions and care that is complete and accurate to the best of my ability. I acknowledge that Practitioner's advice, recommendations, and/or decision may be based on factors not within their control, such as incomplete or inaccurate data provided by me or distortions of diagnostic images or specimens that may result from electronic transmissions. I understand that the practice of medicine is not an exact science and that Practitioner makes no warranties or guarantees regarding treatment outcomes. I acknowledge that a copy of this consent can be made available to me via my patient portal [facility] MyChart), or I can request a printed copy by calling the office of [facility].    I understand that my insurance will be billed for this visit.   I have read or had this consent read to me. I understand the contents of this consent, which adequately explains the benefits and risks of the Services being provided via telemedicine.  I have been provided ample opportunity to ask questions regarding this consent and the Services and have had my questions answered to my satisfaction. I give my informed consent for the services to be provided through the use of telemedicine in my medical care

## 2024-08-07 NOTE — Progress Notes (Addendum)
 Virtual Visit via Telephone Note   Because of Michael Franklin co-morbid illnesses, he is at least at moderate risk for complications without adequate follow up.  This format is felt to be most appropriate for this patient at this time.  Due to technical limitations with video connection (technology), today's appointment will be conducted as an audio only telehealth visit, and Michael Franklin verbally agreed to proceed in this manner.   All issues noted in this document were discussed and addressed.  No physical exam could be performed with this format.  Evaluation Performed:  Preoperative cardiovascular risk assessment _____________   Date:  08/07/2024   Patient ID:  Michael Franklin, DOB 05/03/1976, MRN [id number] Patient Location:  Home Provider location:   Office  Primary Care Provider:  Darron Deatrice LABOR, MD Primary Cardiologist:  Deatrice Darron, MD  Chief Complaint / Patient Profile   48 y.o. y/o male with a h/o coronary artery disease s/p CABG, hyperlipidemia, hypertension, PVCs who is pending EGD and colonoscopy on 08/15/2024 at [facility] and presents today for telephonic preoperative cardiovascular risk assessment.  History of Present Illness    Michael Franklin is a 48 y.o. male who presents via audio/video conferencing for a telehealth visit today.  Pt was last seen in cardiology clinic on 05/27/2024 by Dr. Darron.  At that time Michael Franklin was doing well.  The patient is now pending procedure as outlined above. Since his last visit, he denies chest pain, shortness of breath, lower extremity edema, palpitations, melena, hematuria, hemoptysis, presyncope, syncope, orthopnea, and PND.  Today the patient is doing well without acute cardiovascular concerns or complaints.  Remains active without exertional symptoms.  No symptoms to suggest active angina.  He is able to complete greater than 4 METS.  Past Medical History    Past Medical History:  Diagnosis Date    Hyperlipidemia    NSTEMI (non-ST elevated myocardial infarction) [facility])    Past Surgical History:  Procedure Laterality Date   CORONARY ARTERY BYPASS GRAFT N/A 06/15/2020   Procedure: CORONARY ARTERY BYPASS GRAFTING (CABG) TIMES THREE USING HARVESTED LEFT RADIAL ARTERY, LEFT INTERNAL MAMMARY ARTERY,RIGHT GREATER SAPHENOUS VEIN,HARVESTED ENDOSCOPICALLY. Removal of balloon pump.;  Surgeon: Shyrl Linnie KIDD, MD;  Location: [facility] OR;  Service: Open Heart Surgery;  Laterality: N/A;   CORONARY/GRAFT ACUTE MI REVASCULARIZATION N/A 06/14/2020   Procedure: Coronary/Graft Acute MI Revascularization;  Surgeon: Darron Deatrice LABOR, MD;  Location: [facility] INVASIVE CV LAB;  Service: Cardiovascular;  Laterality: N/A;   IABP INSERTION N/A 06/15/2020   Procedure: IABP Insertion;  Surgeon: Darron Deatrice LABOR, MD;  Location: [facility] INVASIVE CV LAB;  Service: Cardiovascular;  Laterality: N/A;   LEFT HEART CATH AND CORONARY ANGIOGRAPHY N/A 06/14/2020   Procedure: LEFT HEART CATH AND CORONARY ANGIOGRAPHY;  Surgeon: Darron Deatrice LABOR, MD;  Location: [facility] INVASIVE CV LAB;  Service: Cardiovascular;  Laterality: N/A;   LEFT HEART CATH AND CORS/GRAFTS ANGIOGRAPHY N/A 11/05/2020   Procedure: LEFT HEART CATH AND CORS/GRAFTS ANGIOGRAPHY;  Surgeon: Darron Deatrice LABOR, MD;  Location: [facility] INVASIVE CV LAB;  Service: Cardiovascular;  Laterality: N/A;   TEE WITHOUT CARDIOVERSION N/A 06/15/2020   Procedure: TRANSESOPHAGEAL ECHOCARDIOGRAM (TEE);  Surgeon: Shyrl Linnie KIDD, MD;  Location: [facility] OR;  Service: Open Heart Surgery;  Laterality: N/A;    Allergies  Allergies[1]  Home Medications    Prior to Admission medications  Medication Sig Start Date End Date Taking? Authorizing Provider  alfuzosin  (UROXATRAL ) 10 MG 24 hr tablet Take 1 tablet (10 mg total) by  mouth at bedtime. 10/06/23   McKenzie, Belvie CROME, MD  ALPRAZolam  (XANAX ) 1 MG tablet Take 1 tablet (1 mg total) by mouth daily. 10/24/22     ALPRAZolam  (XANAX ) 1 MG tablet Take 1  tablet (1 mg total) by mouth daily. 01/29/23   Darron Deatrice LABOR, MD  ALPRAZolam  (XANAX ) 1 MG tablet Take 1 tablet (1 mg total) by mouth nightly as needed for anxiety/sleep 05/07/23     ALPRAZolam  (XANAX ) 1 MG tablet Take 1 tablet by mouth nightly as needed for anxiety/sleep Patient not taking: Reported on 08/07/2024 04/11/24     ALPRAZolam  (XANAX ) 1 MG tablet Take 1 tablet (1 mg total) by mouth at bedtime as needed for anxiety or sleep. 06/23/24     amLODipine  (NORVASC ) 5 MG tablet Take 1 tablet (5 mg total) by mouth daily. 07/15/24   O'NealDarryle Ned, MD  aspirin  EC 81 MG tablet Take 1 tablet (81 mg total) by mouth daily. Swallow whole. 11/07/21   Thukkani, Arun K, MD  Blood Pressure Monitoring (OMRON 3 SERIES BP MONITOR) DEVI Use as directed 05/06/21   Glade Jodie BROCKS, [facility]  clotrimazole -betamethasone  (LOTRISONE ) cream Apply topically as directed 11/26/23   Vicci Afton CROME, MD  Evolocumab  (REPATHA  SURECLICK) 140 MG/ML SOAJ Inject 140 mg into the skin every 14 days. 10/03/23   Mona Vinie BROCKS, MD  levocetirizine (XYZAL ) 5 MG tablet Take 1 tablet (5 mg total) by mouth every evening. 04/25/22   Darron Deatrice LABOR, MD  nitroGLYCERIN  (NITROSTAT ) 0.4 MG SL tablet Place 1 tablet under the tongue every 5 minutes as needed for chest pain.  If no relief after 1st dose, call 911. 07/15/24   Darron Deatrice LABOR, MD  rosuvastatin  (CRESTOR ) 5 MG tablet Take 1 tablet (5 mg total) by mouth daily. Patient not taking: Reported on 05/27/2024 10/05/23   Darron Deatrice LABOR, MD  sildenafil  (VIAGRA ) 100 MG tablet Take 1 tablet (100 mg total) by mouth daily as needed for erectile dysfunction. 04/14/24   McKenzie, Belvie CROME, MD  tadalafil  (CIALIS ) 5 MG tablet Take 1 tablet (5 mg total) by mouth daily. 07/17/24   McKenzie, Belvie CROME, MD  zolpidem  (AMBIEN ) 5 MG tablet Take 1 tablet by mouth at bedtime as needed for insomnia. 04/25/22   Darron Deatrice LABOR, MD  zolpidem  (AMBIEN ) 5 MG tablet Take 1 tablet (5 mg total) by mouth at bedtime  as needed. 10/03/23   Darron Deatrice LABOR, MD  sucralfate  (CARAFATE ) 1 GM/10ML suspension Take 10 mLs (1 g total) by mouth 4 (four) times daily -  with meals and at bedtime. 09/08/20 10/25/20  Roselyn Carlin NOVAK, MD    Physical Exam    Vital Signs:  Michael Franklin does not have vital signs available for review today.  Given telephonic nature of communication, physical exam is limited. AAOx3. NAD. Normal affect.  Speech and respirations are unlabored.  Accessory Clinical Findings    None  Assessment & Plan    1.  Preoperative Cardiovascular Risk Assessment: According to the Revised Cardiac Risk Index (RCRI), his Perioperative Risk of Major Cardiac Event is (%): 0.9. His Functional Capacity in METs is: 5.62 according to the [facility] Activity Status Index (DASI).  Therefore, based on ACC/AHA guidelines, patient would be at acceptable risk for the planned procedure without further cardiovascular testing. I will route this recommendation to the requesting party via Epic fax function.   The patient was advised that if he develops new symptoms prior to surgery to contact our office to arrange  for a follow-up visit, and he verbalized understanding.  Regarding ASA therapy, we recommend continuation of ASA throughout the perioperative period. However, if the surgeon feels that cessation of ASA is required in the perioperative period, it may be stopped 5-7 days prior to surgery with a plan to resume it as soon as felt to be feasible from a surgical standpoint in the post-operative period.   A copy of this note will be routed to requesting surgeon.  Time:   Today, I have spent 6 minutes with the patient with telehealth technology discussing medical history, symptoms, and management plan.     Michael LITTIE Louis, NP  08/07/2024, 2:54 PM      [1]  Allergies Allergen Reactions   Atorvastatin      myalgias   Crestor  [Rosuvastatin ]     higher doses cause myalgias   Penicillins

## 2024-08-07 NOTE — Telephone Encounter (Signed)
 I s/w DR. Boddy about tele appt today due to ASA hold if needed. Med rec and consent are done.

## 2024-08-13 ENCOUNTER — Other Ambulatory Visit: Payer: Self-pay

## 2024-08-13 ENCOUNTER — Other Ambulatory Visit (HOSPITAL_COMMUNITY): Payer: Self-pay

## 2024-08-13 DIAGNOSIS — Z1211 Encounter for screening for malignant neoplasm of colon: Secondary | ICD-10-CM | POA: Diagnosis not present

## 2024-08-13 DIAGNOSIS — K219 Gastro-esophageal reflux disease without esophagitis: Secondary | ICD-10-CM | POA: Diagnosis not present

## 2024-08-13 DIAGNOSIS — K625 Hemorrhage of anus and rectum: Secondary | ICD-10-CM | POA: Diagnosis not present

## 2024-08-13 MED ORDER — GOLYTELY 236 G PO SOLR
ORAL | 0 refills | Status: AC
  Filled 2024-08-13: qty 4000, 1d supply, fill #0

## 2024-08-15 DIAGNOSIS — K2100 Gastro-esophageal reflux disease with esophagitis, without bleeding: Secondary | ICD-10-CM | POA: Diagnosis not present

## 2024-08-15 DIAGNOSIS — D122 Benign neoplasm of ascending colon: Secondary | ICD-10-CM | POA: Diagnosis not present

## 2024-08-15 DIAGNOSIS — Z1211 Encounter for screening for malignant neoplasm of colon: Secondary | ICD-10-CM | POA: Diagnosis not present

## 2024-08-15 DIAGNOSIS — K625 Hemorrhage of anus and rectum: Secondary | ICD-10-CM | POA: Diagnosis not present

## 2024-08-15 DIAGNOSIS — K648 Other hemorrhoids: Secondary | ICD-10-CM | POA: Diagnosis not present

## 2024-08-15 DIAGNOSIS — K635 Polyp of colon: Secondary | ICD-10-CM | POA: Diagnosis not present

## 2024-08-15 DIAGNOSIS — K259 Gastric ulcer, unspecified as acute or chronic, without hemorrhage or perforation: Secondary | ICD-10-CM | POA: Diagnosis not present

## 2024-08-15 DIAGNOSIS — F418 Other specified anxiety disorders: Secondary | ICD-10-CM | POA: Diagnosis not present

## 2024-08-15 DIAGNOSIS — K319 Disease of stomach and duodenum, unspecified: Secondary | ICD-10-CM | POA: Diagnosis not present

## 2024-08-19 ENCOUNTER — Other Ambulatory Visit: Payer: Self-pay | Admitting: *Deleted

## 2024-08-22 ENCOUNTER — Other Ambulatory Visit (HOSPITAL_COMMUNITY): Payer: Self-pay

## 2024-08-22 MED ORDER — OSELTAMIVIR PHOSPHATE 75 MG PO CAPS
75.0000 mg | ORAL_CAPSULE | Freq: Two times a day (BID) | ORAL | 0 refills | Status: AC
  Filled 2024-08-22: qty 10, 5d supply, fill #0

## 2024-09-30 ENCOUNTER — Other Ambulatory Visit (HOSPITAL_COMMUNITY)
Admission: AD | Admit: 2024-09-30 | Discharge: 2024-09-30 | Disposition: A | Source: Ambulatory Visit | Attending: Cardiovascular Disease | Admitting: Cardiovascular Disease

## 2024-09-30 ENCOUNTER — Other Ambulatory Visit: Payer: Self-pay | Admitting: *Deleted

## 2024-09-30 ENCOUNTER — Other Ambulatory Visit: Payer: Self-pay | Admitting: Cardiovascular Disease

## 2024-09-30 DIAGNOSIS — R079 Chest pain, unspecified: Secondary | ICD-10-CM

## 2024-09-30 NOTE — Addendum Note (Signed)
 Addended by: ESTELLE OLAM GRADE on: 09/30/2024 01:54 PM   Modules accepted: Orders
